# Patient Record
Sex: Female | Born: 1965 | Race: White | Hispanic: No | Marital: Single | Smoking: Current every day smoker
Health system: Southern US, Community
[De-identification: ages and names within clinical notes are randomized; demographics above are authoritative.]

## PROBLEM LIST (undated history)

## (undated) DIAGNOSIS — R7989 Other specified abnormal findings of blood chemistry: Secondary | ICD-10-CM

## (undated) DIAGNOSIS — I10 Essential (primary) hypertension: Secondary | ICD-10-CM

## (undated) DIAGNOSIS — J45909 Unspecified asthma, uncomplicated: Secondary | ICD-10-CM

## (undated) DIAGNOSIS — J449 Chronic obstructive pulmonary disease, unspecified: Secondary | ICD-10-CM

## (undated) DIAGNOSIS — E785 Hyperlipidemia, unspecified: Secondary | ICD-10-CM

## (undated) DIAGNOSIS — E876 Hypokalemia: Secondary | ICD-10-CM

## (undated) DIAGNOSIS — K219 Gastro-esophageal reflux disease without esophagitis: Secondary | ICD-10-CM

## (undated) HISTORY — DX: Essential (primary) hypertension: I10

## (undated) HISTORY — DX: Other specified abnormal findings of blood chemistry: R79.89

## (undated) HISTORY — DX: Unspecified asthma, uncomplicated: J45.909

## (undated) HISTORY — DX: Gastro-esophageal reflux disease without esophagitis: K21.9

## (undated) HISTORY — DX: Hypokalemia: E87.6

## (undated) HISTORY — DX: Hyperlipidemia, unspecified: E78.5

## (undated) HISTORY — PX: TONSILLECTOMY: SUR1361

## (undated) HISTORY — PX: INCISION AND DRAINAGE ABSCESS: SHX5864

## (undated) HISTORY — DX: Chronic obstructive pulmonary disease, unspecified: J44.9

---

## 2014-09-28 ENCOUNTER — Telehealth: Payer: Self-pay

## 2014-09-28 MED ORDER — LISINOPRIL-HYDROCHLOROTHIAZIDE 10-12.5 MG PO TABS: 1.0000 | ORAL_TABLET | Freq: Every day | ORAL | Status: DC

## 2014-09-28 NOTE — Telephone Encounter (Signed)
Lisinopril-HCTZ 10-12.5  [facility]

## 2014-09-28 NOTE — Telephone Encounter (Signed)
Rx sent to her pharmacy 

## 2014-12-18 ENCOUNTER — Other Ambulatory Visit: Payer: Self-pay | Admitting: Family Medicine

## 2014-12-18 NOTE — Telephone Encounter (Signed)
Dr. Laural Benes is her primary; routing

## 2014-12-27 ENCOUNTER — Ambulatory Visit: Payer: Self-pay | Admitting: Family Medicine

## 2015-01-03 ENCOUNTER — Ambulatory Visit: Payer: Self-pay | Admitting: Family Medicine

## 2015-01-03 ENCOUNTER — Other Ambulatory Visit: Payer: Self-pay | Admitting: Family Medicine

## 2015-01-03 DIAGNOSIS — E785 Hyperlipidemia, unspecified: Secondary | ICD-10-CM

## 2015-01-03 DIAGNOSIS — Z720 Tobacco use: Secondary | ICD-10-CM

## 2015-01-03 DIAGNOSIS — I10 Essential (primary) hypertension: Secondary | ICD-10-CM

## 2015-01-03 DIAGNOSIS — Z0000 Encounter for general adult medical examination without abnormal findings: Secondary | ICD-10-CM

## 2015-01-11 ENCOUNTER — Ambulatory Visit: Payer: Medicaid Other | Admitting: Family Medicine

## 2015-01-11 ENCOUNTER — Other Ambulatory Visit: Payer: Self-pay | Admitting: Family Medicine

## 2015-01-11 DIAGNOSIS — I10 Essential (primary) hypertension: Secondary | ICD-10-CM

## 2015-01-11 DIAGNOSIS — E785 Hyperlipidemia, unspecified: Secondary | ICD-10-CM

## 2015-01-11 DIAGNOSIS — I129 Hypertensive chronic kidney disease with stage 1 through stage 4 chronic kidney disease, or unspecified chronic kidney disease: Secondary | ICD-10-CM | POA: Insufficient documentation

## 2015-01-17 ENCOUNTER — Encounter: Payer: Self-pay | Admitting: Family Medicine

## 2015-01-17 ENCOUNTER — Ambulatory Visit (INDEPENDENT_AMBULATORY_CARE_PROVIDER_SITE_OTHER): Payer: Medicaid Other | Admitting: Family Medicine

## 2015-01-17 ENCOUNTER — Other Ambulatory Visit: Payer: Self-pay | Admitting: Family Medicine

## 2015-01-17 VITALS — BP 139/83 | HR 101 | Temp 99.2°F | Ht 59.3 in | Wt 213.0 lb

## 2015-01-17 DIAGNOSIS — Z0000 Encounter for general adult medical examination without abnormal findings: Secondary | ICD-10-CM | POA: Diagnosis not present

## 2015-01-17 DIAGNOSIS — I10 Essential (primary) hypertension: Secondary | ICD-10-CM

## 2015-01-17 DIAGNOSIS — Z1239 Encounter for other screening for malignant neoplasm of breast: Secondary | ICD-10-CM

## 2015-01-17 DIAGNOSIS — Z720 Tobacco use: Secondary | ICD-10-CM

## 2015-01-17 DIAGNOSIS — J4540 Moderate persistent asthma, uncomplicated: Secondary | ICD-10-CM

## 2015-01-17 DIAGNOSIS — L739 Follicular disorder, unspecified: Secondary | ICD-10-CM

## 2015-01-17 DIAGNOSIS — E785 Hyperlipidemia, unspecified: Secondary | ICD-10-CM

## 2015-01-17 DIAGNOSIS — J45909 Unspecified asthma, uncomplicated: Secondary | ICD-10-CM | POA: Insufficient documentation

## 2015-01-17 MED ORDER — FLUTICASONE-SALMETEROL 100-50 MCG/DOSE IN AEPB: 1.0000 | INHALATION_SPRAY | Freq: Two times a day (BID) | RESPIRATORY_TRACT | Status: DC

## 2015-01-17 MED ORDER — TRIAMCINOLONE ACETONIDE 0.1 % EX CREA: 1.0000 "application " | TOPICAL_CREAM | Freq: Two times a day (BID) | CUTANEOUS | Status: DC

## 2015-01-17 MED ORDER — SULFAMETHOXAZOLE-TRIMETHOPRIM 800-160 MG PO TABS: 1.0000 | ORAL_TABLET | Freq: Two times a day (BID) | ORAL | Status: DC

## 2015-01-17 NOTE — Assessment & Plan Note (Signed)
Encouraged patient to quit. Information given to patient today.

## 2015-01-17 NOTE — Assessment & Plan Note (Signed)
Not under great control. Has been taking 2 inhaled corticosteroids. Will stop them and start advair which she has had good results with previously. Will do spirometry next visit.

## 2015-01-17 NOTE — Assessment & Plan Note (Signed)
Checking labs when she is fasting. Continue current regimen.

## 2015-01-17 NOTE — Patient Instructions (Addendum)
Breast Self-Awareness Practicing breast self-awareness may pick up problems early, prevent significant medical complications, and possibly save your life. By practicing breast self-awareness, you can become familiar with how your breasts look and feel and if your breasts are changing. This allows you to notice changes early. It can also offer you some reassurance that your breast health is good. One way to learn what is normal for your breasts and whether your breasts are changing is to do a breast self-exam. If you find a lump or something that was not present in the past, it is best to contact your caregiver right away. Other findings that should be evaluated by your caregiver include nipple discharge, especially if it is bloody; skin changes or reddening; areas where the skin seems to be pulled in (retracted); or new lumps and bumps. Breast pain is seldom associated with cancer (malignancy), but should also be evaluated by a caregiver. HOW TO PERFORM A BREAST SELF-EXAM The best time to examine your breasts is 5-7 days after your menstrual period is over. During menstruation, the breasts are lumpier, and it may be more difficult to pick up changes. If you do not menstruate, have reached menopause, or had your uterus removed (hysterectomy), you should examine your breasts at regular intervals, such as monthly. If you are breastfeeding, examine your breasts after a feeding or after using a breast pump. Breast implants do not decrease the risk for lumps or tumors, so continue to perform breast self-exams as recommended. Talk to your caregiver about how to determine the difference between the implant and breast tissue. Also, talk about the amount of pressure you should use during the exam. Over time, you will become more familiar with the variations of your breasts and more comfortable with the exam. A breast self-exam requires you to remove all your clothes above the waist.  Look at your breasts and nipples.  Stand in front of a mirror in a room with good lighting. With your hands on your hips, push your hands firmly downward. Look for a difference in shape, contour, and size from one breast to the other (asymmetry). Asymmetry includes puckers, dips, or bumps. Also, look for skin changes, such as reddened or scaly areas on the breasts. Look for nipple changes, such as discharge, dimpling, repositioning, or redness.  Carefully feel your breasts. This is best done either in the shower or tub while using soapy water or when flat on your back. Place the arm (on the side of the breast you are examining) above your head. Use the pads (not the fingertips) of your three middle fingers on your opposite hand to feel your breasts. Start in the underarm area and use  inch (2 cm) overlapping circles to feel your breast. Use 3 different levels of pressure (light, medium, and firm pressure) at each circle before moving to the next circle. The light pressure is needed to feel the tissue closest to the skin. The medium pressure will help to feel breast tissue a little deeper, while the firm pressure is needed to feel the tissue close to the ribs. Continue the overlapping circles, moving downward over the breast until you feel your ribs below your breast. Then, move one finger-width towards the center of the body. Continue to use the  inch (2 cm) overlapping circles to feel your breast as you move slowly up toward the collar bone (clavicle) near the base of the neck. Continue the up and down exam using all 3 pressures until you reach the  middle of the chest. Do this with each breast, carefully feeling for lumps or changes.   Keep a written record with breast changes or normal findings for each breast. By writing this information down, you do not need to depend only on memory for size, tenderness, or location. Write down where you are in your menstrual cycle, if you are still menstruating. Breast tissue can have some lumps or thick  tissue. However, see your caregiver if you find anything that concerns you.  SEEK MEDICAL CARE IF:  You see a change in shape, contour, or size of your breasts or nipples.   You see skin changes, such as reddened or scaly areas on the breasts or nipples.   You have an unusual discharge from your nipples.   You feel a new lump or unusually thick areas.    This information is not intended to replace advice given to you by your health care provider. Make sure you discuss any questions you have with your health care provider.   Document Released: 03/24/2005 Document Revised: 03/10/2012 Document Reviewed: 07/09/2011 Elsevier Interactive Patient Education 2016 [place] for Adults, Female A healthy lifestyle and preventive care can promote health and wellness. Preventive health guidelines for women include the following key practices.  A routine yearly physical is a good way to check with your health care provider about your health and preventive screening. It is a chance to share any concerns and updates on your health and to receive a thorough exam.  Visit your dentist for a routine exam and preventive care every 6 months. Brush your teeth twice a day and floss once a day. Good oral hygiene prevents tooth decay and gum disease.  The frequency of eye exams is based on your age, health, family medical history, use of contact lenses, and other factors. Follow your health care provider's recommendations for frequency of eye exams.  Eat a healthy diet. Foods like vegetables, fruits, whole grains, low-fat dairy products, and lean protein foods contain the nutrients you need without too many calories. Decrease your intake of foods high in solid fats, added sugars, and salt. Eat the right amount of calories for you.Get information about a proper diet from your health care provider, if necessary.  Regular physical exercise is one of the most important things you can do for  your health. Most adults should get at least 150 minutes of moderate-intensity exercise (any activity that increases your heart rate and causes you to sweat) each week. In addition, most adults need muscle-strengthening exercises on 2 or more days a week.  Maintain a healthy weight. The body mass index (BMI) is a screening tool to identify possible weight problems. It provides an estimate of body fat based on height and weight. Your health care provider can find your BMI and can help you achieve or maintain a healthy weight.For adults 20 years and older:  A BMI below 18.5 is considered underweight.  A BMI of 18.5 to 24.9 is normal.  A BMI of 25 to 29.9 is considered overweight.  A BMI of 30 and above is considered obese.  Maintain normal blood lipids and cholesterol levels by exercising and minimizing your intake of saturated fat. Eat a balanced diet with plenty of fruit and vegetables. Blood tests for lipids and cholesterol should begin at age 71 and be repeated every 5 years. If your lipid or cholesterol levels are high, you are over 50, or you are at high risk for heart disease, you  may need your cholesterol levels checked more frequently.Ongoing high lipid and cholesterol levels should be treated with medicines if diet and exercise are not working.  If you smoke, find out from your health care provider how to quit. If you do not use tobacco, do not start.  Lung cancer screening is recommended for adults aged 70-80 years who are at high risk for developing lung cancer because of a history of smoking. A yearly low-dose CT scan of the lungs is recommended for people who have at least a 30-pack-year history of smoking and are a current smoker or have quit within the past 15 years. A pack year of smoking is smoking an average of 1 pack of cigarettes a day for 1 year (for example: 1 pack a day for 30 years or 2 packs a day for 15 years). Yearly screening should continue until the smoker has stopped  smoking for at least 15 years. Yearly screening should be stopped for people who develop a health problem that would prevent them from having lung cancer treatment.  If you are pregnant, do not drink alcohol. If you are breastfeeding, be very cautious about drinking alcohol. If you are not pregnant and choose to drink alcohol, do not have more than 1 drink per day. One drink is considered to be 12 ounces (355 mL) of beer, 5 ounces (148 mL) of wine, or 1.5 ounces (44 mL) of liquor.  Avoid use of street drugs. Do not share needles with anyone. Ask for help if you need support or instructions about stopping the use of drugs.  High blood pressure causes heart disease and increases the risk of stroke. Your blood pressure should be checked at least every 1 to 2 years. Ongoing high blood pressure should be treated with medicines if weight loss and exercise do not work.  If you are 27-48 years old, ask your health care provider if you should take aspirin to prevent strokes.  Diabetes screening is done by taking a blood sample to check your blood glucose level after you have not eaten for a certain period of time (fasting). If you are not overweight and you do not have risk factors for diabetes, you should be screened once every 3 years starting at age 42. If you are overweight or obese and you are 54-51 years of age, you should be screened for diabetes every year as part of your cardiovascular risk assessment.  Breast cancer screening is essential preventive care for women. You should practice "breast self-awareness." This means understanding the normal appearance and feel of your breasts and may include breast self-examination. Any changes detected, no matter how small, should be reported to a health care provider. Women in their 22s and 30s should have a clinical breast exam (CBE) by a health care provider as part of a regular health exam every 1 to 3 years. After age 38, women should have a CBE every year.  Starting at age 37, women should consider having a mammogram (breast X-ray test) every year. Women who have a family history of breast cancer should talk to their health care provider about genetic screening. Women at a high risk of breast cancer should talk to their health care providers about having an MRI and a mammogram every year.  Breast cancer gene (BRCA)-related cancer risk assessment is recommended for women who have family members with BRCA-related cancers. BRCA-related cancers include breast, ovarian, tubal, and peritoneal cancers. Having family members with these cancers may be associated with an  increased risk for harmful changes (mutations) in the breast cancer genes BRCA1 and BRCA2. Results of the assessment will determine the need for genetic counseling and BRCA1 and BRCA2 testing.  Your health care provider may recommend that you be screened regularly for cancer of the pelvic organs (ovaries, uterus, and vagina). This screening involves a pelvic examination, including checking for microscopic changes to the surface of your cervix (Pap test). You may be encouraged to have this screening done every 3 years, beginning at age 18.  For women ages 52-65, health care providers may recommend pelvic exams and Pap testing every 3 years, or they may recommend the Pap and pelvic exam, combined with testing for human papilloma virus (HPV), every 5 years. Some types of HPV increase your risk of cervical cancer. Testing for HPV may also be done on women of any age with unclear Pap test results.  Other health care providers may not recommend any screening for nonpregnant women who are considered low risk for pelvic cancer and who do not have symptoms. Ask your health care provider if a screening pelvic exam is right for you.  If you have had past treatment for cervical cancer or a condition that could lead to cancer, you need Pap tests and screening for cancer for at least 20 years after your treatment.  If Pap tests have been discontinued, your risk factors (such as having a new sexual partner) need to be reassessed to determine if screening should resume. Some women have medical problems that increase the chance of getting cervical cancer. In these cases, your health care provider may recommend more frequent screening and Pap tests.  Colorectal cancer can be detected and often prevented. Most routine colorectal cancer screening begins at the age of 51 years and continues through age 78 years. However, your health care provider may recommend screening at an earlier age if you have risk factors for colon cancer. On a yearly basis, your health care provider may provide home test kits to check for hidden blood in the stool. Use of a small camera at the end of a tube, to directly examine the colon (sigmoidoscopy or colonoscopy), can detect the earliest forms of colorectal cancer. Talk to your health care provider about this at age 81, when routine screening begins. Direct exam of the colon should be repeated every 5-10 years through age 85 years, unless early forms of precancerous polyps or small growths are found.  People who are at an increased risk for hepatitis B should be screened for this virus. You are considered at high risk for hepatitis B if:  You were born in a country where hepatitis B occurs often. Talk with your health care provider about which countries are considered high risk.  Your parents were born in a high-risk country and you have not received a shot to protect against hepatitis B (hepatitis B vaccine).  You have HIV or AIDS.  You use needles to inject street drugs.  You live with, or have sex with, someone who has hepatitis B.  You get hemodialysis treatment.  You take certain medicines for conditions like cancer, organ transplantation, and autoimmune conditions.  Hepatitis C blood testing is recommended for all people born from [birth year] through 1965 and any individual with known  risks for hepatitis C.  Practice safe sex. Use condoms and avoid high-risk sexual practices to reduce the spread of sexually transmitted infections (STIs). STIs include gonorrhea, chlamydia, syphilis, trichomonas, herpes, HPV, and human immunodeficiency virus (HIV). Herpes, HIV,  and HPV are viral illnesses that have no cure. They can result in disability, cancer, and death.  You should be screened for sexually transmitted illnesses (STIs) including gonorrhea and chlamydia if:  You are sexually active and are younger than 24 years.  You are older than 24 years and your health care provider tells you that you are at risk for this type of infection.  Your sexual activity has changed since you were last screened and you are at an increased risk for chlamydia or gonorrhea. Ask your health care provider if you are at risk.  If you are at risk of being infected with HIV, it is recommended that you take a prescription medicine daily to prevent HIV infection. This is called preexposure prophylaxis (PrEP). You are considered at risk if:  You are sexually active and do not regularly use condoms or know the HIV status of your partner(s).  You take drugs by injection.  You are sexually active with a partner who has HIV.  Talk with your health care provider about whether you are at high risk of being infected with HIV. If you choose to begin PrEP, you should first be tested for HIV. You should then be tested every 3 months for as long as you are taking PrEP.  Osteoporosis is a disease in which the bones lose minerals and strength with aging. This can result in serious bone fractures or breaks. The risk of osteoporosis can be identified using a bone density scan. Women ages 65 years and over and women at risk for fractures or osteoporosis should discuss screening with their health care providers. Ask your health care provider whether you should take a calcium supplement or vitamin D to reduce the rate of  osteoporosis.  Menopause can be associated with physical symptoms and risks. Hormone replacement therapy is available to decrease symptoms and risks. You should talk to your health care provider about whether hormone replacement therapy is right for you.  Use sunscreen. Apply sunscreen liberally and repeatedly throughout the day. You should seek shade when your shadow is shorter than you. Protect yourself by wearing long sleeves, pants, a wide-brimmed hat, and sunglasses year round, whenever you are outdoors.  Once a month, do a whole body skin exam, using a mirror to look at the skin on your back. Tell your health care provider of new moles, moles that have irregular borders, moles that are larger than a pencil eraser, or moles that have changed in shape or color.  Stay current with required vaccines (immunizations).  Influenza vaccine. All adults should be immunized every year.  Tetanus, diphtheria, and acellular pertussis (Td, Tdap) vaccine. Pregnant women should receive 1 dose of Tdap vaccine during each pregnancy. The dose should be obtained regardless of the length of time since the last dose. Immunization is preferred during the 27th-36th week of gestation. An adult who has not previously received Tdap or who does not know her vaccine status should receive 1 dose of Tdap. This initial dose should be followed by tetanus and diphtheria toxoids (Td) booster doses every 10 years. Adults with an unknown or incomplete history of completing a 3-dose immunization series with Td-containing vaccines should begin or complete a primary immunization series including a Tdap dose. Adults should receive a Td booster every 10 years.  Varicella vaccine. An adult without evidence of immunity to varicella should receive 2 doses or a second dose if she has previously received 1 dose. Pregnant females who do not have evidence of  immunity should receive the first dose after pregnancy. This first dose should be  obtained before leaving the health care facility. The second dose should be obtained 4-8 weeks after the first dose.  Human papillomavirus (HPV) vaccine. Females aged 13-26 years who have not received the vaccine previously should obtain the 3-dose series. The vaccine is not recommended for use in pregnant females. However, pregnancy testing is not needed before receiving a dose. If a female is found to be pregnant after receiving a dose, no treatment is needed. In that case, the remaining doses should be delayed until after the pregnancy. Immunization is recommended for any person with an immunocompromised condition through the age of 68 years if she did not get any or all doses earlier. During the 3-dose series, the second dose should be obtained 4-8 weeks after the first dose. The third dose should be obtained 24 weeks after the first dose and 16 weeks after the second dose.  Zoster vaccine. One dose is recommended for adults aged 71 years or older unless certain conditions are present.  Measles, mumps, and rubella (MMR) vaccine. Adults born before [birth year] generally are considered immune to measles and mumps. Adults born in [birth year] or later should have 1 or more doses of MMR vaccine unless there is a contraindication to the vaccine or there is laboratory evidence of immunity to each of the three diseases. A routine second dose of MMR vaccine should be obtained at least 28 days after the first dose for students attending postsecondary schools, health care workers, or international travelers. People who received inactivated measles vaccine or an unknown type of measles vaccine during 1963-1967 should receive 2 doses of MMR vaccine. People who received inactivated mumps vaccine or an unknown type of mumps vaccine before 1979 and are at high risk for mumps infection should consider immunization with 2 doses of MMR vaccine. For females of childbearing age, rubella immunity should be determined. If there is no evidence  of immunity, females who are not pregnant should be vaccinated. If there is no evidence of immunity, females who are pregnant should delay immunization until after pregnancy. Unvaccinated health care workers born before [birth year] who lack laboratory evidence of measles, mumps, or rubella immunity or laboratory confirmation of disease should consider measles and mumps immunization with 2 doses of MMR vaccine or rubella immunization with 1 dose of MMR vaccine.  Pneumococcal 13-valent conjugate (PCV13) vaccine. When indicated, a person who is uncertain of his immunization history and has no record of immunization should receive the PCV13 vaccine. All adults 19 years of age and older should receive this vaccine. An adult aged 71 years or older who has certain medical conditions and has not been previously immunized should receive 1 dose of PCV13 vaccine. This PCV13 should be followed with a dose of pneumococcal polysaccharide (PPSV23) vaccine. Adults who are at high risk for pneumococcal disease should obtain the PPSV23 vaccine at least 8 weeks after the dose of PCV13 vaccine. Adults older than 49 years of age who have normal immune system function should obtain the PPSV23 vaccine dose at least 1 year after the dose of PCV13 vaccine.  Pneumococcal polysaccharide (PPSV23) vaccine. When PCV13 is also indicated, PCV13 should be obtained first. All adults aged 58 years and older should be immunized. An adult younger than age 22 years who has certain medical conditions should be immunized. Any person who resides in a nursing home or long-term care facility should be immunized. An adult smoker should be immunized. People  with an immunocompromised condition and certain other conditions should receive both PCV13 and PPSV23 vaccines. People with human immunodeficiency virus (HIV) infection should be immunized as soon as possible after diagnosis. Immunization during chemotherapy or radiation therapy should be avoided. Routine use  of PPSV23 vaccine is not recommended for American Indians, [place] Natives, or people younger than 65 years unless there are medical conditions that require PPSV23 vaccine. When indicated, people who have unknown immunization and have no record of immunization should receive PPSV23 vaccine. One-time revaccination 5 years after the first dose of PPSV23 is recommended for people aged 19-64 years who have chronic kidney failure, nephrotic syndrome, asplenia, or immunocompromised conditions. People who received 1-2 doses of PPSV23 before age 106 years should receive another dose of PPSV23 vaccine at age 65 years or later if at least 5 years have passed since the previous dose. Doses of PPSV23 are not needed for people immunized with PPSV23 at or after age 41 years.  Meningococcal vaccine. Adults with asplenia or persistent complement component deficiencies should receive 2 doses of quadrivalent meningococcal conjugate (MenACWY-D) vaccine. The doses should be obtained at least 2 months apart. Microbiologists working with certain meningococcal bacteria, [employer] recruits, people at risk during an outbreak, and people who travel to or live in countries with a high rate of meningitis should be immunized. A first-year college student up through age 44 years who is living in a residence hall should receive a dose if she did not receive a dose on or after her 16th birthday. Adults who have certain high-risk conditions should receive one or more doses of vaccine.  Hepatitis A vaccine. Adults who wish to be protected from this disease, have certain high-risk conditions, work with hepatitis A-infected animals, work in hepatitis A research labs, or travel to or work in countries with a high rate of hepatitis A should be immunized. Adults who were previously unvaccinated and who anticipate close contact with an international adoptee during the first 60 days after arrival in the [place] from a country with a high rate of  hepatitis A should be immunized.  Hepatitis B vaccine. Adults who wish to be protected from this disease, have certain high-risk conditions, may be exposed to blood or other infectious body fluids, are household contacts or sex partners of hepatitis B positive people, are clients or workers in certain care facilities, or travel to or work in countries with a high rate of hepatitis B should be immunized.  Haemophilus influenzae type b (Hib) vaccine. A previously unvaccinated person with asplenia or sickle cell disease or having a scheduled splenectomy should receive 1 dose of Hib vaccine. 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 Hib vaccine is not recommended for adults with HIV infection. Preventive Services / Frequency Ages 61 to 64 years  Blood pressure check.** / Every 3-5 years.  Lipid and cholesterol check.** / Every 5 years beginning at age 73.  Clinical breast exam.** / Every 3 years for women in their 15s and 7s.  BRCA-related cancer risk assessment.** / For women who have family members with a BRCA-related cancer (breast, ovarian, tubal, or peritoneal cancers).  Pap test.** / Every 2 years from ages 52 through 33. Every 3 years starting at age 73 through age 38 or 27 with a history of 3 consecutive normal Pap tests.  HPV screening.** / Every 3 years from ages 30 through ages 27 to 39  with a history of 3 consecutive normal Pap tests.  Hepatitis C blood test.** / For any individual with known risks for hepatitis C.  Skin self-exam. / Monthly.  Influenza vaccine. / Every year.  Tetanus, diphtheria, and acellular pertussis (Tdap, Td) vaccine.** / Consult your health care provider. Pregnant women should receive 1 dose of Tdap vaccine during each pregnancy. 1 dose of Td every 10 years.  Varicella vaccine.** / Consult your health care provider. Pregnant females  who do not have evidence of immunity should receive the first dose after pregnancy.  HPV vaccine. / 3 doses over 6 months, if 48 and younger. The vaccine is not recommended for use in pregnant females. However, pregnancy testing is not needed before receiving a dose.  Measles, mumps, rubella (MMR) vaccine.** / You need at least 1 dose of MMR if you were born in 1957 or later. You may also need a 2nd dose. For females of childbearing age, rubella immunity should be determined. If there is no evidence of immunity, females who are not pregnant should be vaccinated. If there is no evidence of immunity, females who are pregnant should delay immunization until after pregnancy.  Pneumococcal 13-valent conjugate (PCV13) vaccine.** / Consult your health care provider.  Pneumococcal polysaccharide (PPSV23) vaccine.** / 1 to 2 doses if you smoke cigarettes or if you have certain conditions.  Meningococcal vaccine.** / 1 dose if you are age 39 to 19 years and a Market researcher living in a residence hall, or have one of several medical conditions, you need to get vaccinated against meningococcal disease. You may also need additional booster doses.  Hepatitis A vaccine.** / Consult your health care provider.  Hepatitis B vaccine.** / Consult your health care provider.  Haemophilus influenzae type b (Hib) vaccine.** / Consult your health care provider. Ages 38 to 68 years  Blood pressure check.** / Every year.  Lipid and cholesterol check.** / Every 5 years beginning at age 1 years.  Lung cancer screening. / Every year if you are aged 108-80 years and have a 30-pack-year history of smoking and currently smoke or have quit within the past 15 years. Yearly screening is stopped once you have quit smoking for at least 15 years or develop a health problem that would prevent you from having lung cancer treatment.  Clinical breast exam.** / Every year after age 76 years.  BRCA-related cancer risk  assessment.** / For women who have family members with a BRCA-related cancer (breast, ovarian, tubal, or peritoneal cancers).  Mammogram.** / Every year beginning at age 47 years and continuing for as long as you are in good health. Consult with your health care provider.  Pap test.** / Every 3 years starting at age 67 years through age 46 or 62 years with a history of 3 consecutive normal Pap tests.  HPV screening.** / Every 3 years from ages 16 years through ages 61 to 44 years with a history of 3 consecutive normal Pap tests.  Fecal occult blood test (FOBT) of stool. / Every year beginning at age 12 years and continuing until age 18 years. You may not need to do this test if you get a colonoscopy every 10 years.  Flexible sigmoidoscopy or colonoscopy.** / Every 5 years for a flexible sigmoidoscopy or every 10 years for a colonoscopy beginning at age 67 years and continuing until age 57 years.  Hepatitis C blood test.** / For all people born from [birth year] through 1965 and any individual with known risks for  hepatitis C.  Skin self-exam. / Monthly.  Influenza vaccine. / Every year.  Tetanus, diphtheria, and acellular pertussis (Tdap/Td) vaccine.** / Consult your health care provider. Pregnant women should receive 1 dose of Tdap vaccine during each pregnancy. 1 dose of Td every 10 years.  Varicella vaccine.** / Consult your health care provider. Pregnant females who do not have evidence of immunity should receive the first dose after pregnancy.  Zoster vaccine.** / 1 dose for adults aged 43 years or older.  Measles, mumps, rubella (MMR) vaccine.** / You need at least 1 dose of MMR if you were born in 1957 or later. You may also need a second dose. For females of childbearing age, rubella immunity should be determined. If there is no evidence of immunity, females who are not pregnant should be vaccinated. If there is no evidence of immunity, females who are pregnant should delay immunization until  after pregnancy.  Pneumococcal 13-valent conjugate (PCV13) vaccine.** / Consult your health care provider.  Pneumococcal polysaccharide (PPSV23) vaccine.** / 1 to 2 doses if you smoke cigarettes or if you have certain conditions.  Meningococcal vaccine.** / Consult your health care provider.  Hepatitis A vaccine.** / Consult your health care provider.  Hepatitis B vaccine.** / Consult your health care provider.  Haemophilus influenzae type b (Hib) vaccine.** / Consult your health care provider. Ages 47 years and over  Blood pressure check.** / Every year.  Lipid and cholesterol check.** / Every 5 years beginning at age 96 years.  Lung cancer screening. / Every year if you are aged 22-80 years and have a 30-pack-year history of smoking and currently smoke or have quit within the past 15 years. Yearly screening is stopped once you have quit smoking for at least 15 years or develop a health problem that would prevent you from having lung cancer treatment.  Clinical breast exam.** / Every year after age 29 years.  BRCA-related cancer risk assessment.** / For women who have family members with a BRCA-related cancer (breast, ovarian, tubal, or peritoneal cancers).  Mammogram.** / Every year beginning at age 74 years and continuing for as long as you are in good health. Consult with your health care provider.  Pap test.** / Every 3 years starting at age 50 years through age 18 or 80 years with 3 consecutive normal Pap tests. Testing can be stopped between 65 and 70 years with 3 consecutive normal Pap tests and no abnormal Pap or HPV tests in the past 10 years.  HPV screening.** / Every 3 years from ages 71 years through ages 12 or 45 years with a history of 3 consecutive normal Pap tests. Testing can be stopped between 65 and 70 years with 3 consecutive normal Pap tests and no abnormal Pap or HPV tests in the past 10 years.  Fecal occult blood test (FOBT) of stool. / Every year beginning at  age 61 years and continuing until age 37 years. You may not need to do this test if you get a colonoscopy every 10 years.  Flexible sigmoidoscopy or colonoscopy.** / Every 5 years for a flexible sigmoidoscopy or every 10 years for a colonoscopy beginning at age 76 years and continuing until age 56 years.  Hepatitis C blood test.** / For all people born from [birth year] through 1965 and any individual with known risks for hepatitis C.  Osteoporosis screening.** / A one-time screening for women ages 8 years and over and women at risk for fractures or osteoporosis.  Skin self-exam. / Monthly.  Influenza vaccine. / Every year.  Tetanus, diphtheria, and acellular pertussis (Tdap/Td) vaccine.** / 1 dose of Td every 10 years.  Varicella vaccine.** / Consult your health care provider.  Zoster vaccine.** / 1 dose for adults aged 65 years or older.  Pneumococcal 13-valent conjugate (PCV13) vaccine.** / Consult your health care provider.  Pneumococcal polysaccharide (PPSV23) vaccine.** / 1 dose for all adults aged 50 years and older.  Meningococcal vaccine.** / Consult your health care provider.  Hepatitis A vaccine.** / Consult your health care provider.  Hepatitis B vaccine.** / Consult your health care provider.  Haemophilus influenzae type b (Hib) vaccine.** / Consult your health care provider. ** Family history and personal history of risk and conditions may change your health care provider's recommendations.   This information is not intended to replace advice given to you by your health care provider. Make sure you discuss any questions you have with your health care provider.   Document Released: 05/20/2001 Document Revised: 04/14/2014 Document Reviewed: 08/19/2010 Elsevier Interactive Patient Education 2016 [employer]. Smoking Cessation, Tips for Success If you are ready to quit smoking, congratulations! You have chosen to help yourself be healthier. Cigarettes bring nicotine, tar,  carbon monoxide, and other irritants into your body. Your lungs, heart, and blood vessels will be able to work better without these poisons. There are many different ways to quit smoking. Nicotine gum, nicotine patches, a nicotine inhaler, or nicotine nasal spray can help with physical craving. Hypnosis, support groups, and medicines help break the habit of smoking. WHAT THINGS CAN I DO TO MAKE QUITTING EASIER?  Here are some tips to help you quit for good:  Pick a date when you will quit smoking completely. Tell all of your friends and family about your plan to quit on that date.  Do not try to slowly cut down on the number of cigarettes you are smoking. Pick a quit date and quit smoking completely starting on that day.  Throw away all cigarettes.   Clean and remove all ashtrays from your home, work, and car.  On a card, write down your reasons for quitting. Carry the card with you and read it when you get the urge to smoke.  Cleanse your body of nicotine. Drink enough water and fluids to keep your urine clear or pale yellow. Do this after quitting to flush the nicotine from your body.  Learn to predict your moods. Do not let a bad situation be your excuse to have a cigarette. Some situations in your life might tempt you into wanting a cigarette.  Never have "just one" cigarette. It leads to wanting another and another. Remind yourself of your decision to quit.  Change habits associated with smoking. If you smoked while driving or when feeling stressed, try other activities to replace smoking. Stand up when drinking your coffee. Brush your teeth after eating. Sit in a different chair when you read the paper. Avoid alcohol while trying to quit, and try to drink fewer caffeinated beverages. Alcohol and caffeine may urge you to smoke.  Avoid foods and drinks that can trigger a desire to smoke, such as sugary or spicy foods and alcohol.  Ask people who smoke not to smoke around you.  Have  something planned to do right after eating or having a cup of coffee. For example, plan to take a walk or exercise.  Try a relaxation exercise to calm you down and decrease your stress. Remember, you may be tense and nervous for the  first 2 weeks after you quit, but this will pass.  Find new activities to keep your hands busy. Play with a pen, coin, or rubber band. Doodle or draw things on paper.  Brush your teeth right after eating. This will help cut down on the craving for the taste of tobacco after meals. You can also try mouthwash.   Use oral substitutes in place of cigarettes. Try using lemon drops, carrots, cinnamon sticks, or chewing gum. Keep them handy so they are available when you have the urge to smoke.  When you have the urge to smoke, try deep breathing.  Designate your home as a nonsmoking area.  If you are a heavy smoker, ask your health care provider about a prescription for nicotine chewing gum. It can ease your withdrawal from nicotine.  Reward yourself. Set aside the cigarette money you save and buy yourself something nice.  Look for support from others. Join a support group or smoking cessation program. Ask someone at home or at work to help you with your plan to quit smoking.  Always ask yourself, "Do I need this cigarette or is this just a reflex?" Tell yourself, "Today, I choose not to smoke," or "I do not want to smoke." You are reminding yourself of your decision to quit.  Do not replace cigarette smoking with electronic cigarettes (commonly called e-cigarettes). The safety of e-cigarettes is unknown, and some may contain harmful chemicals.  If you relapse, do not give up! Plan ahead and think about what you will do the next time you get the urge to smoke. HOW WILL I FEEL WHEN I QUIT SMOKING? You may have symptoms of withdrawal because your body is used to nicotine (the addictive substance in cigarettes). You may crave cigarettes, be irritable, feel very hungry,  cough often, get headaches, or have difficulty concentrating. The withdrawal symptoms are only temporary. They are strongest when you first quit but will go away within 10-14 days. When withdrawal symptoms occur, stay in control. Think about your reasons for quitting. Remind yourself that these are signs that your body is healing and getting used to being without cigarettes. Remember that withdrawal symptoms are easier to treat than the major diseases that smoking can cause.  Even after the withdrawal is over, expect periodic urges to smoke. However, these cravings are generally short lived and will go away whether you smoke or not. Do not smoke! WHAT RESOURCES ARE AVAILABLE TO HELP ME QUIT SMOKING? Your health care provider can direct you to community resources or hospitals for support, which may include:  Group support.  Education.  Hypnosis.  Therapy.   This information is not intended to replace advice given to you by your health care provider. Make sure you discuss any questions you have with your health care provider.   Document Released: 12/21/2003 Document Revised: 04/14/2014 Document Reviewed: 09/09/2012 Elsevier Interactive Patient Education [employer].

## 2015-01-17 NOTE — Assessment & Plan Note (Signed)
Checking labs when she is fasting. Continue current regimen.  

## 2015-01-17 NOTE — Progress Notes (Signed)
BP 139/83 mmHg  Pulse 101  Temp(Src) 99.2 F (37.3 C)  Ht 4' 11.3" (1.506 m)  Wt 213 lb (96.616 kg)  BMI 42.60 kg/m2  SpO2 98%  LMP 01/15/2015   Subjective:    Patient ID: Nancy Stout, female    DOB: 30-Jul-1965, 49 y.o.   MRN: [id number]  HPI: Nancy Stout is a 49 y.o. female presenting on 01/17/2015 for comprehensive medical examination. Current medical complaints include:  RASH Duration:  chronic 8 months Location: in her hair all over  Itching: yes Burning: no Redness: no Oozing: yes Scaling: no Blisters: yes Painful: no Fevers: no Change in detergents/soaps/personal care products: no Recent illness: no Recent travel:no History of same: no Context: fluctuating Alleviating factors: medicated shampoo Treatments attempted:medicated shampoo Shortness of breath: no  Throat/tongue swelling: no Myalgias/arthralgias: no   ASTHMA Asthma status: uncontrolled Satisfied with current treatment?: no Albuterol/rescue inhaler frequency: multiple times daily Dyspnea frequency: daily Wheezing frequency: daily Cough frequency: daily Nocturnal symptom frequency: couple of times a month  Limitation of activity: yes Current upper respiratory symptoms: no Triggers: dust, allergies, change in weather Home peak flows: no Aerochamber/spacer use: no Visits to ER or Urgent Care in past year: no Pneumovax: Up to Date Influenza: Refused  Menopausal Symptoms: no  Depression Screen done today and results listed below:  Depression screen [facility] 2/9 01/17/2015  Decreased Interest 2  Down, Depressed, Hopeless 0  PHQ - 2 Score 2  Altered sleeping 0  Tired, decreased energy 0  Change in appetite 0  Feeling bad or failure about yourself  0  Trouble concentrating 0  Moving slowly or fidgety/restless 0  Suicidal thoughts 0  PHQ-9 Score 2  Difficult doing work/chores Not difficult at all     Past Medical History:  Past Medical History  Diagnosis Date  . Low blood potassium      Due to Stomach Virus  . Low serum sodium     Due to stomach virus  . COPD (chronic obstructive pulmonary disease) (HCC)   . GERD (gastroesophageal reflux disease)   . Hyperlipidemia   . Hypertension   . Asthma     Surgical History:  Past Surgical History  Procedure Laterality Date  . Tonsillectomy    . Cesarean section    . Incision and drainage abscess Left     leg    Medications:  Current Outpatient Prescriptions on File Prior to Visit  Medication Sig  . albuterol (PROVENTIL) (2.5 MG/3ML) 0.083% nebulizer solution USE 1 VIAL IN NEBULIZER EVERY 4 HOURS  . fexofenadine (ALLEGRA) 180 MG tablet Take 180 mg by mouth daily.  Marland Kitchen lisinopril-hydrochlorothiazide (PRINZIDE,ZESTORETIC) 10-12.5 MG per tablet Take 1 tablet by mouth daily.  . pravastatin (PRAVACHOL) 40 MG tablet Take 40 mg by mouth daily.  Marland Kitchen PROAIR HFA 108 (90 BASE) MCG/ACT inhaler INHALE 2 PUFFS BY MOUTH EVERY 4 HOURS AS NEEDED   No current facility-administered medications on file prior to visit.    Allergies:  No Known Allergies  Social History:  Social History   Social History  . Marital Status: Single    Spouse Name: N/A  . Number of Children: N/A  . Years of Education: N/A   Occupational History  . Not on file.   Social History Main Topics  . Smoking status: Current Every Day Smoker -- 0.25 packs/day    Types: Cigarettes  . Smokeless tobacco: Never Used  . Alcohol Use: 0.0 oz/week    0 Standard drinks or equivalent per week  Comment: one or less per day  . Drug Use: No  . Sexual Activity: Yes    Birth Control/ Protection: None   Other Topics Concern  . Not on file   Social History Narrative   History  Smoking status  . Current Every Day Smoker -- 0.25 packs/day  . Types: Cigarettes  Smokeless tobacco  . Never Used   History  Alcohol Use  . 0.0 oz/week  . 0 Standard drinks or equivalent per week    Comment: one or less per day    Family History:  Family History  Problem  Relation Age of Onset  . Hyperlipidemia Mother   . Hypertension Mother   . Cancer Father     Lung  . Hypertension Father   . Hyperlipidemia Sister   . Drug abuse Son     Past medical history, surgical history, medications, allergies, family history and social history reviewed with patient today and changes made to appropriate areas of the chart.   Review of Systems  Constitutional: Positive for malaise/fatigue. Negative for fever, chills, weight loss and diaphoresis.  HENT: Positive for ear discharge. Negative for congestion, ear pain, hearing loss, nosebleeds, sore throat and tinnitus.   Eyes: Negative.   Respiratory: Positive for cough and wheezing. Negative for hemoptysis, sputum production, shortness of breath and stridor.   Cardiovascular: Negative.   Gastrointestinal: Positive for heartburn. Negative for nausea, vomiting, abdominal pain, diarrhea, constipation, blood in stool and melena.  Genitourinary: Negative.   Musculoskeletal: Negative.   Skin: Positive for itching and rash.  Neurological: Positive for tingling and headaches. Negative for dizziness, tremors, sensory change, speech change, focal weakness, seizures, loss of consciousness and weakness.  Endo/Heme/Allergies: Positive for environmental allergies. Negative for polydipsia. Bruises/bleeds easily.  Psychiatric/Behavioral: Negative.     All other ROS negative except what is listed above and in the HPI.      Objective:    BP 139/83 mmHg  Pulse 101  Temp(Src) 99.2 F (37.3 C)  Ht 4' 11.3" (1.506 m)  Wt 213 lb (96.616 kg)  BMI 42.60 kg/m2  SpO2 98%  LMP 01/15/2015  Wt Readings from Last 3 Encounters:  01/17/15 213 lb (96.616 kg)  12/27/14 204 lb (92.534 kg)    Physical Exam  Constitutional: She is oriented to person, place, and time. She appears well-developed and well-nourished. No distress.  HENT:  Head: Normocephalic and atraumatic.  Right Ear: Hearing and external ear normal.  Left Ear: Hearing and  external ear normal.  Nose: Nose normal.  Mouth/Throat: Oropharynx is clear and moist. No oropharyngeal exudate.  Eyes: Conjunctivae, EOM and lids are normal. Pupils are equal, round, and reactive to light. Right eye exhibits no discharge. Left eye exhibits no discharge. No scleral icterus.  Neck: Normal range of motion. Neck supple. No JVD present. No tracheal deviation present. No thyromegaly present.  Cardiovascular: Normal rate, regular rhythm, normal heart sounds and intact distal pulses.  Exam reveals no gallop and no friction rub.   No murmur heard. Pulmonary/Chest: Effort normal. No stridor. No respiratory distress. She has wheezes. She has no rales. She exhibits no tenderness. Right breast exhibits no inverted nipple, no mass, no nipple discharge, no skin change and no tenderness. Left breast exhibits no inverted nipple, no mass, no nipple discharge, no skin change and no tenderness. Breasts are symmetrical.  End expiratory on L lung  Abdominal: Soft. Bowel sounds are normal. She exhibits no distension and no mass. There is no tenderness. There is no rebound  and no guarding.  Genitourinary:  Deferred at patient's request  Musculoskeletal: Normal range of motion. She exhibits no edema or tenderness.  Lymphadenopathy:    She has no cervical adenopathy.  Neurological: She is alert and oriented to person, place, and time. She has normal reflexes. She displays normal reflexes. No cranial nerve deficit. She exhibits normal muscle tone. Coordination normal.  Skin: Skin is warm, dry and intact. Rash noted. She is not diaphoretic. No erythema. No pallor.  Pustular pin prick spots in hair, excoriated  Psychiatric: She has a normal mood and affect. Her speech is normal and behavior is normal. Judgment and thought content normal. Cognition and memory are normal.  Nursing note and vitals reviewed.   No results found for this or any previous visit.    Assessment & Plan:   Problem List Items  Addressed This Visit      Cardiovascular and Mediastinum   HTN (hypertension)    Checking labs when she is fasting. Continue current regimen.         Respiratory   Asthma    Not under great control. Has been taking 2 inhaled corticosteroids. Will stop them and start advair which she has had good results with previously. Will do spirometry next visit.       Relevant Medications   Fluticasone-Salmeterol (ADVAIR) 100-50 MCG/DOSE AEPB     Other   Hyperlipidemia    Checking labs when she is fasting. Continue current regimen.       Routine general medical examination at a health care facility - Primary    Up to date on shots except flu which she refuses. Mammo ordered today. Pap up to date- will obtain records to see if she's due 2017 or 2019. To do labs when she is fasting shortly. Work on diet and exercise.       Tobacco abuse    Encouraged patient to quit. Information given to patient today.        Other Visit Diagnoses    Screening for breast cancer        Mammo ordered today.     Relevant Orders    MM DIGITAL SCREENING BILATERAL    Folliculitis        Will treat with bactrim for superficial infection and triamcinalone for when the itching starts. Call if not getting better or getting worse.         Follow up plan: Return ASAP for lab only visit, 6 months, for HTN/HLD visit.   LABORATORY TESTING:  - Pap smear: up to date  IMMUNIZATIONS:   - Tdap: Tetanus vaccination status reviewed: last tetanus booster within 10 years. - Influenza: Refused - Pneumovax: Up to date  SCREENING: -Mammogram: Ordered today   PATIENT COUNSELING:   Advised to take 1 mg of folate supplement per day if capable of pregnancy.   Sexuality: Discussed sexually transmitted diseases, partner selection, use of condoms, avoidance of unintended pregnancy  and contraceptive alternatives.   Advised to avoid cigarette smoking.  I discussed with the patient that most people either abstain from  alcohol or drink within safe limits (<=14/week and <=4 drinks/occasion for males, <=7/weeks and <= 3 drinks/occasion for females) and that the risk for alcohol disorders and other health effects rises proportionally with the number of drinks per week and how often a drinker exceeds daily limits.  Discussed cessation/primary prevention of drug use and availability of treatment for abuse.   Diet: Encouraged to adjust caloric intake to maintain  or achieve  ideal body weight, to reduce intake of dietary saturated fat and total fat, to limit sodium intake by avoiding high sodium foods and not adding table salt, and to maintain adequate dietary potassium and calcium preferably from fresh fruits, vegetables, and low-fat dairy products.    stressed the importance of regular exercise  Injury prevention: Discussed safety belts, safety helmets, smoke detector, smoking near bedding or upholstery.   Dental health: Discussed importance of regular tooth brushing, flossing, and dental visits.    NEXT PREVENTATIVE PHYSICAL DUE IN 1 YEAR. Return ASAP for lab only visit, 6 months, for HTN/HLD visit.

## 2015-01-17 NOTE — Assessment & Plan Note (Signed)
Up to date on shots except flu which she refuses. Mammo ordered today. Pap up to date- will obtain records to see if she's due 2017 or 2019. To do labs when she is fasting shortly. Work on diet and exercise.

## 2015-01-19 ENCOUNTER — Other Ambulatory Visit: Payer: Medicaid Other

## 2015-01-19 DIAGNOSIS — E785 Hyperlipidemia, unspecified: Secondary | ICD-10-CM

## 2015-01-19 DIAGNOSIS — I10 Essential (primary) hypertension: Secondary | ICD-10-CM

## 2015-01-19 DIAGNOSIS — Z0000 Encounter for general adult medical examination without abnormal findings: Secondary | ICD-10-CM

## 2015-01-19 DIAGNOSIS — Z720 Tobacco use: Secondary | ICD-10-CM

## 2015-01-19 LAB — LIPID PANEL PICCOLO, WAIVED
CHOLESTEROL PICCOLO, WAIVED: 221 mg/dL — AB (ref <200)
Chol/HDL Ratio Piccolo,Waive: 2.9 mg/dL
HDL CHOL PICCOLO, WAIVED: 77 mg/dL (ref >59)
LDL Chol Calc Piccolo Waived: 126 mg/dL — ABNORMAL HIGH (ref <100)
Triglycerides Piccolo,Waived: 91 mg/dL (ref <150)
VLDL CHOL CALC PICCOLO,WAIVE: 18 mg/dL (ref <30)

## 2015-01-19 LAB — UA/M W/RFLX CULTURE, ROUTINE
Bilirubin, UA: NEGATIVE
Glucose, UA: NEGATIVE
Leukocytes, UA: NEGATIVE
NITRITE UA: NEGATIVE
PH UA: 5.5 (ref 5.0–7.5)
Specific Gravity, UA: >1.03 — ABNORMAL HIGH (ref 1.005–1.030)
UUROB: 0.2 mg/dL (ref 0.2–1.0)

## 2015-01-19 LAB — MICROSCOPIC EXAMINATION

## 2015-01-19 LAB — MICROALBUMIN, URINE WAIVED
Creatinine, Urine Waived: 300 mg/dL (ref 10–300)
Microalb, Ur Waived: 80 mg/L — ABNORMAL HIGH (ref 0–19)

## 2015-01-20 LAB — COMPREHENSIVE METABOLIC PANEL
A/G RATIO: 1.7 (ref 1.1–2.5)
ALT: 21 IU/L (ref 0–32)
AST: 13 IU/L (ref 0–40)
Albumin: 4.5 g/dL (ref 3.5–5.5)
Alkaline Phosphatase: 99 IU/L (ref 39–117)
BUN/Creatinine Ratio: 20 (ref 9–23)
BUN: 17 mg/dL (ref 6–24)
Bilirubin Total: 0.5 mg/dL (ref 0.0–1.2)
CALCIUM: 9.8 mg/dL (ref 8.7–10.2)
CO2: 26 mmol/L (ref 18–29)
Chloride: 95 mmol/L — ABNORMAL LOW (ref 97–108)
Creatinine, Ser: 0.84 mg/dL (ref 0.57–1.00)
GFR calc Af Amer: 94 mL/min/{1.73_m2} (ref >59)
GFR, EST NON AFRICAN AMERICAN: 82 mL/min/{1.73_m2} (ref >59)
GLUCOSE: 106 mg/dL — AB (ref 65–99)
Globulin, Total: 2.6 g/dL (ref 1.5–4.5)
POTASSIUM: 5.2 mmol/L (ref 3.5–5.2)
Sodium: 136 mmol/L (ref 134–144)
Total Protein: 7.1 g/dL (ref 6.0–8.5)

## 2015-01-20 LAB — CBC WITH DIFFERENTIAL/PLATELET
BASOS ABS: 0 10*3/uL (ref 0.0–0.2)
Basos: 1 %
EOS (ABSOLUTE): 0.3 10*3/uL (ref 0.0–0.4)
Eos: 5 %
Hematocrit: 45.9 % (ref 34.0–46.6)
Hemoglobin: 16.2 g/dL — ABNORMAL HIGH (ref 11.1–15.9)
IMMATURE GRANS (ABS): 0 10*3/uL (ref 0.0–0.1)
IMMATURE GRANULOCYTES: 0 %
LYMPHS: 20 %
Lymphocytes Absolute: 1.3 10*3/uL (ref 0.7–3.1)
MCH: 33.3 pg — ABNORMAL HIGH (ref 26.6–33.0)
MCHC: 35.3 g/dL (ref 31.5–35.7)
MCV: 94 fL (ref 79–97)
MONOS ABS: 0.6 10*3/uL (ref 0.1–0.9)
Monocytes: 10 %
NEUTROS PCT: 64 %
Neutrophils Absolute: 4 10*3/uL (ref 1.4–7.0)
PLATELETS: 293 10*3/uL (ref 150–379)
RBC: 4.86 x10E6/uL (ref 3.77–5.28)
RDW: 13.6 % (ref 12.3–15.4)
WBC: 6.2 10*3/uL (ref 3.4–10.8)

## 2015-01-20 LAB — TSH: TSH: 0.993 u[IU]/mL (ref 0.450–4.500)

## 2015-01-22 ENCOUNTER — Encounter: Payer: Self-pay | Admitting: Family Medicine

## 2015-01-22 DIAGNOSIS — D751 Secondary polycythemia: Secondary | ICD-10-CM

## 2015-01-24 ENCOUNTER — Encounter: Payer: Self-pay | Admitting: Unknown Physician Specialty

## 2015-01-24 ENCOUNTER — Ambulatory Visit (INDEPENDENT_AMBULATORY_CARE_PROVIDER_SITE_OTHER): Payer: Medicaid Other | Admitting: Unknown Physician Specialty

## 2015-01-24 VITALS — BP 109/72 | HR 99 | Temp 98.1°F | Ht 59.7 in | Wt 209.0 lb

## 2015-01-24 DIAGNOSIS — J4540 Moderate persistent asthma, uncomplicated: Secondary | ICD-10-CM | POA: Diagnosis not present

## 2015-01-24 DIAGNOSIS — L500 Allergic urticaria: Secondary | ICD-10-CM | POA: Diagnosis not present

## 2015-01-24 DIAGNOSIS — T50905A Adverse effect of unspecified drugs, medicaments and biological substances, initial encounter: Principal | ICD-10-CM

## 2015-01-24 MED ORDER — PREDNISONE 10 MG PO TABS: ORAL_TABLET | ORAL | Status: DC

## 2015-01-24 MED ORDER — FLUTICASONE-SALMETEROL 250-50 MCG/DOSE IN AEPB: 1.0000 | INHALATION_SPRAY | Freq: Two times a day (BID) | RESPIRATORY_TRACT | Status: DC

## 2015-01-24 NOTE — Assessment & Plan Note (Addendum)
Uncontrolled increased Advair dose to 250-50 mcg/dose instructed to take 1 puff twice a day.  ? Poor control due to allergy to Sulfa.  She is due to see Dr. Laural BenesJohnson next month

## 2015-01-24 NOTE — Progress Notes (Signed)
BP 109/72 mmHg  Pulse 99  Temp(Src) 98.1 F (36.7 C)  Ht 4' 11.7" (1.516 m)  Wt 209 lb (94.802 kg)  BMI 41.25 kg/m2  SpO2 100%  LMP 01/15/2015 (Exact Date)   Subjective:    Patient ID: Nancy Stout, female    DOB: 1965-07-16, 49 y.o.   MRN: [id number]  HPI: Nancy Stout is a 49 y.o. female  Chief Complaint  Patient presents with  . Rash    pt states she has a rash that has broken out from her neck to her knees. States she has swelling, itching and burning with the rash. States Dr. Laural Benes gave her septra that she started taking last Thursday and the rash appeared yesterday so she did not take the antibiotic today because she states she does not know if this rash is coming from the medication.   Rash Pt presents to the office with c/o itchy, burning rash with bilateral hand swelling that started in her hair than traveled down to her knees onset last night.  She has been taking Bactrim 7 days ago for Folliculitis. She has started taking Advair she has taken this in the past without complications.  She has a hx of hives.  She has taken po Benadryl and Triamcinalone cream without relief of symptoms.  Pertinent negative denies changes in laundrey detergent or soap, insect bites, recent travel, sick contacts, shortness of breath, chest pain, swelling of throat, dysphagia, fever, chills, nausea, vomiting, or diarrhea   Asthma Pt presents with c/o increased wheezing she states the Advair has not been working she has had increased wheezing.  7 days ago her Qvar and Flovent were discontinued.  She has been using her Albuterol Nebulizer Solution more frequently up to 3 times a day.  Pertinent negatives denies chest pain, shortness of breath, or  palpitations  Relevant past medical, surgical, family and social history reviewed and updated as indicated. Interim medical history since our last visit reviewed. Allergies and medications reviewed and updated.  Review of Systems   Constitutional: Negative.   HENT: Positive for sore throat. Negative for ear discharge, ear pain, mouth sores, postnasal drip, rhinorrhea, sinus pressure and sneezing.   Respiratory: Positive for cough and wheezing. Negative for choking, chest tightness, shortness of breath and stridor.   Cardiovascular: Negative.   Gastrointestinal: Negative.   Genitourinary: Negative.   Musculoskeletal: Positive for arthralgias.  Allergic/Immunologic: Positive for environmental allergies. Negative for food allergies.       Generalized rash  Neurological: Negative.   Hematological: Negative.   Psychiatric/Behavioral: Negative.     Per HPI unless specifically indicated above     Objective:    BP 109/72 mmHg  Pulse 99  Temp(Src) 98.1 F (36.7 C)  Ht 4' 11.7" (1.516 m)  Wt 209 lb (94.802 kg)  BMI 41.25 kg/m2  SpO2 100%  LMP 01/15/2015 (Exact Date)  Wt Readings from Last 3 Encounters:  01/24/15 209 lb (94.802 kg)  01/17/15 213 lb (96.616 kg)  12/27/14 204 lb (92.534 kg)    Physical Exam  Constitutional: She is oriented to person, place, and time. She appears well-developed and well-nourished. No distress.  HENT:  Head: Normocephalic and atraumatic.  Right Ear: External ear normal.  Left Ear: External ear normal.  Nose: Nose normal.  Neck: Normal range of motion. Neck supple.  Cardiovascular: Normal rate, regular rhythm, normal heart sounds and intact distal pulses.   Pulmonary/Chest: Effort normal. No respiratory distress. She has wheezes. She has no rales. She exhibits  no tenderness.  Musculoskeletal: Normal range of motion.  2+ nonpitting bilateral hand edema  Neurological: She is alert and oriented to person, place, and time.  Skin: Rash noted. She is not diaphoretic. There is erythema. No pallor.  Generalized flat erythematous rash present from head to bilateral shins; skin is warm to touch; scattered excoriated areas on bilateral wrist and forearms   Psychiatric: She has a normal  mood and affect. Her behavior is normal. Judgment and thought content normal.        Assessment & Plan:   Problem List Items Addressed This Visit      Unprioritized   Asthma    Uncontrolled increased Advair dose to 250-50 mcg/dose instructed to take 1 puff twice a day.  ? Poor control due to allergy to Sulfa.  She is due to see Dr. Laural BenesJohnson next month      Relevant Medications   predniSONE (DELTASONE) 10 MG tablet   Fluticasone-Salmeterol (ADVAIR DISKUS) 250-50 MCG/DOSE AEPB    Other Visit Diagnoses    Urticaria due to drug allergy    -  Primary    Prescribed Prednisone taper instructed to take as directed Continue taking Allegra daily Notify office if symptoms worsen or persist        Follow up plan: Return if symptoms worsen or fail to improve.

## 2015-02-07 ENCOUNTER — Other Ambulatory Visit: Payer: Self-pay | Admitting: Family Medicine

## 2015-02-09 NOTE — Telephone Encounter (Signed)
Pt would like to know exactly why her refills were denied.

## 2015-03-18 ENCOUNTER — Other Ambulatory Visit: Payer: Self-pay | Admitting: Unknown Physician Specialty

## 2015-03-19 NOTE — Telephone Encounter (Signed)
Your patient.  Thanks 

## 2015-05-13 ENCOUNTER — Other Ambulatory Visit: Payer: Self-pay | Admitting: Family Medicine

## 2015-06-04 ENCOUNTER — Other Ambulatory Visit: Payer: Self-pay | Admitting: Family Medicine

## 2015-06-04 NOTE — Telephone Encounter (Signed)
Called Allegra prescription in, they have the proair prescription.

## 2015-06-04 NOTE — Telephone Encounter (Signed)
Refilled with 6 refills at the beginning of the month. If she has gone through all of those refills, she needs an appointment.

## 2015-07-04 ENCOUNTER — Encounter: Payer: Self-pay | Admitting: Family Medicine

## 2015-07-04 ENCOUNTER — Ambulatory Visit (INDEPENDENT_AMBULATORY_CARE_PROVIDER_SITE_OTHER): Payer: Medicaid Other | Admitting: Family Medicine

## 2015-07-04 VITALS — BP 130/84 | HR 82 | Temp 98.0°F | Ht 60.2 in | Wt 210.0 lb

## 2015-07-04 DIAGNOSIS — J302 Other seasonal allergic rhinitis: Secondary | ICD-10-CM | POA: Diagnosis not present

## 2015-07-04 DIAGNOSIS — J4540 Moderate persistent asthma, uncomplicated: Secondary | ICD-10-CM | POA: Diagnosis not present

## 2015-07-04 DIAGNOSIS — L309 Dermatitis, unspecified: Secondary | ICD-10-CM | POA: Diagnosis not present

## 2015-07-04 DIAGNOSIS — J309 Allergic rhinitis, unspecified: Secondary | ICD-10-CM | POA: Insufficient documentation

## 2015-07-04 MED ORDER — TRIAMCINOLONE ACETONIDE 0.1 % EX CREA: 1.0000 "application " | TOPICAL_CREAM | Freq: Two times a day (BID) | CUTANEOUS | Status: DC

## 2015-07-04 MED ORDER — FLUTICASONE PROPIONATE 50 MCG/ACT NA SUSP: 2.0000 | Freq: Every day | NASAL | Status: DC

## 2015-07-04 MED ORDER — FLUTICASONE-SALMETEROL 250-50 MCG/DOSE IN AEPB: INHALATION_SPRAY | RESPIRATORY_TRACT | Status: DC

## 2015-07-04 NOTE — Assessment & Plan Note (Signed)
Will start flonase. Continue allegra, if not getting better consider singulair. Call with any problems.

## 2015-07-04 NOTE — Assessment & Plan Note (Signed)
Lungs clear today. Continue current regimen. Continue to monitor. Recheck at follow up. Continue medication.

## 2015-07-04 NOTE — Progress Notes (Signed)
BP 130/84 mmHg  Pulse 82  Temp(Src) 98 F (36.7 C)  Ht 5' 0.2" (1.529 m)  Wt 210 lb (95.255 kg)  BMI 40.74 kg/m2  SpO2 98%  LMP 07/01/2015 (Exact Date)   Subjective:    Patient ID: Nancy Stout, female    DOB: 01-Jun-1965, 50 y.o.   MRN: [id number]  HPI: Nancy Stout is a 50 y.o. female  Chief Complaint  Patient presents with  . Asthma    Patient needs a refill on the Advair 250  . Rash    Patient has a rash on the tip of her nose and between her fingers, she broke out with hives over the weekend  . Allergies    Patient states that her eyes are really gooky, is feels like there is sand and baby oil in them.   RASH Duration:  Saturday  Location: in between her fingers and on her face  Itching: yes Burning: yes Redness: yes Oozing: no Scaling: no Blisters: no Painful: no Fevers: no Change in detergents/soaps/personal care products: no Recent illness: no Recent travel:no History of same: no Context: better Alleviating factors: hydrocortisone cream, benadryl and lotion/moisturizer Treatments attempted:hydrocortisone cream, benadryl and lotion/moisturizer Shortness of breath: no  Throat/tongue swelling: no Myalgias/arthralgias: no  ALLERGIES Duration: about a week Runny nose: yes  Nasal congestion: yes Nasal itching: yes Sneezing: yes Eye swelling, itching or discharge: yes Post nasal drip: no Cough: no Sinus pressure: no  Ear pain: no  Ear pressure: no  Fever: no  Satisfied with current treatment: no Allergist evaluation in past: no Allergen injection immunotherapy: no Recurrent sinus infections: no ENT evaluation in past: no Known environmental allergy: yes  Breathing doing better. No concerns about that,  Relevant past medical, surgical, family and social history reviewed and updated as indicated. Interim medical history since our last visit reviewed. Allergies and medications reviewed and updated.  Review of Systems  Constitutional:  Negative.   HENT: Positive for congestion, postnasal drip, rhinorrhea, sinus pressure and sneezing. Negative for dental problem, drooling, ear discharge, ear pain, facial swelling, hearing loss, mouth sores, nosebleeds, sore throat, tinnitus, trouble swallowing and voice change.   Respiratory: Negative.   Cardiovascular: Negative.   Psychiatric/Behavioral: Negative.     Per HPI unless specifically indicated above     Objective:    BP 130/84 mmHg  Pulse 82  Temp(Src) 98 F (36.7 C)  Ht 5' 0.2" (1.529 m)  Wt 210 lb (95.255 kg)  BMI 40.74 kg/m2  SpO2 98%  LMP 07/01/2015 (Exact Date)  Wt Readings from Last 3 Encounters:  07/04/15 210 lb (95.255 kg)  01/24/15 209 lb (94.802 kg)  01/17/15 213 lb (96.616 kg)    Physical Exam  Constitutional: She is oriented to person, place, and time. She appears well-developed and well-nourished. No distress.  HENT:  Head: Normocephalic and atraumatic.  Right Ear: Hearing and external ear normal.  Left Ear: Hearing and external ear normal.  Nose: Nose normal.  Mouth/Throat: Oropharynx is clear and moist. No oropharyngeal exudate.  Eyes: Conjunctivae, EOM and lids are normal. Pupils are equal, round, and reactive to light. Right eye exhibits no discharge. Left eye exhibits no discharge. No scleral icterus.  Neck: Normal range of motion. Neck supple. No JVD present. No tracheal deviation present. No thyromegaly present.  Cardiovascular: Normal rate, regular rhythm, normal heart sounds and intact distal pulses.  Exam reveals no gallop and no friction rub.   No murmur heard. Pulmonary/Chest: Effort normal and breath sounds normal. No stridor.  No respiratory distress. She has no wheezes. She has no rales. She exhibits no tenderness.  Musculoskeletal: Normal range of motion.  Lymphadenopathy:    She has cervical adenopathy.  Neurological: She is alert and oriented to person, place, and time.  Skin: Skin is warm, dry and intact. Rash (dishydrotic eczema  on inside of her fingers) noted. She is not diaphoretic. No erythema. No pallor.  Psychiatric: She has a normal mood and affect. Her speech is normal and behavior is normal. Judgment and thought content normal. Cognition and memory are normal.  Nursing note and vitals reviewed.   Results for orders placed or performed in visit on 01/19/15  Microscopic Examination  Result Value Ref Range   WBC, UA 0-5 0 -  5 /hpf   RBC, UA 0-2 0 -  2 /hpf   Epithelial Cells (non renal) 0-10 0 - 10 /hpf   Crystals Present (A) N/A   Crystal Type Amorphous Sediment N/A   Mucus, UA Present Not Estab.   Bacteria, UA Few None seen/Few  CBC with Differential/Platelet  Result Value Ref Range   WBC 6.2 3.4 - 10.8 x10E3/uL   RBC 4.86 3.77 - 5.28 x10E6/uL   Hemoglobin 16.2 (H) 11.1 - 15.9 g/dL   Hematocrit 16.1 09.6 - 46.6 %   MCV 94 79 - 97 fL   MCH 33.3 (H) 26.6 - 33.0 pg   MCHC 35.3 31.5 - 35.7 g/dL   RDW 04.5 40.9 - 81.1 %   Platelets 293 150 - 379 x10E3/uL   Neutrophils 64 %   Lymphs 20 %   Monocytes 10 %   Eos 5 %   Basos 1 %   Neutrophils Absolute 4.0 1.4 - 7.0 x10E3/uL   Lymphocytes Absolute 1.3 0.7 - 3.1 x10E3/uL   Monocytes Absolute 0.6 0.1 - 0.9 x10E3/uL   EOS (ABSOLUTE) 0.3 0.0 - 0.4 x10E3/uL   Basophils Absolute 0.0 0.0 - 0.2 x10E3/uL   Immature Granulocytes 0 %   Immature Grans (Abs) 0.0 0.0 - 0.1 x10E3/uL  Comprehensive metabolic panel  Result Value Ref Range   Glucose 106 (H) 65 - 99 mg/dL   BUN 17 6 - 24 mg/dL   Creatinine, Ser 9.14 0.57 - 1.00 mg/dL   GFR calc non Af Amer 82 >59 mL/min/1.73   GFR calc Af Amer 94 >59 mL/min/1.73   BUN/Creatinine Ratio 20 9 - 23   Sodium 136 134 - 144 mmol/L   Potassium 5.2 3.5 - 5.2 mmol/L   Chloride 95 (L) 97 - 108 mmol/L   CO2 26 18 - 29 mmol/L   Calcium 9.8 8.7 - 10.2 mg/dL   Total Protein 7.1 6.0 - 8.5 g/dL   Albumin 4.5 3.5 - 5.5 g/dL   Globulin, Total 2.6 1.5 - 4.5 g/dL   Albumin/Globulin Ratio 1.7 1.1 - 2.5   Bilirubin Total 0.5 0.0  - 1.2 mg/dL   Alkaline Phosphatase 99 39 - 117 IU/L   AST 13 0 - 40 IU/L   ALT 21 0 - 32 IU/L  Lipid Panel Piccolo, Waived  Result Value Ref Range   Cholesterol Piccolo, Waived 221 (H) <200 mg/dL   HDL Chol Piccolo, Waived 77 >59 mg/dL   Triglycerides Piccolo,Waived 91 <150 mg/dL   Chol/HDL Ratio Piccolo,Waive 2.9 mg/dL   LDL Chol Calc Piccolo Waived 126 (H) <100 mg/dL   VLDL Chol Calc Piccolo,Waive 18 <30 mg/dL  Microalbumin, Urine Waived  Result Value Ref Range   Microalb, Ur Waived 80 (H) 0 -  19 mg/L   Creatinine, Urine Waived 300 10 - 300 mg/dL   Microalb/Creat Ratio 30-300 (H) <30 mg/g  TSH  Result Value Ref Range   TSH 0.993 0.450 - 4.500 uIU/mL  UA/M w/rflx Culture, Routine  Result Value Ref Range   Specific Gravity, UA >1.030 (H) 1.005 - 1.030   pH, UA 5.5 5.0 - 7.5   Color, UA Yellow Yellow   Appearance Ur Clear Clear   Leukocytes, UA Negative Negative   Protein, UA Trace Negative/Trace   Glucose, UA Negative Negative   Ketones, UA Trace (A) Negative   RBC, UA Trace (A) Negative   Bilirubin, UA Negative Negative   Urobilinogen, Ur 0.2 0.2 - 1.0 mg/dL   Nitrite, UA Negative Negative   Microscopic Examination See below:       Assessment & Plan:   Problem List Items Addressed This Visit      Respiratory   Asthma    Lungs clear today. Continue current regimen. Continue to monitor. Recheck at follow up. Continue medication.       Relevant Medications   Fluticasone-Salmeterol (ADVAIR DISKUS) 250-50 MCG/DOSE AEPB   Allergic rhinitis    Will start flonase. Continue allegra, if not getting better consider singulair. Call with any problems.         Musculoskeletal and Integument   Eczema of both hands - Primary    Will treat with triamcinalone. Call if not getting better. Gentle skin cleansing.           Follow up plan: Return As scheduled.

## 2015-07-04 NOTE — Assessment & Plan Note (Addendum)
Will treat with triamcinalone. Call if not getting better. Gentle skin cleansing.

## 2015-07-09 ENCOUNTER — Other Ambulatory Visit: Payer: Self-pay | Admitting: Family Medicine

## 2015-07-18 ENCOUNTER — Ambulatory Visit: Payer: Medicaid Other | Admitting: Family Medicine

## 2015-07-19 ENCOUNTER — Other Ambulatory Visit: Payer: Self-pay | Admitting: Family Medicine

## 2015-11-10 ENCOUNTER — Other Ambulatory Visit: Payer: Self-pay | Admitting: Family Medicine

## 2015-11-12 NOTE — Telephone Encounter (Signed)
Patient overdue for appointment. Please get her scheduled for appointment and then I'll get her enough to make it to that appointment.

## 2015-11-12 NOTE — Telephone Encounter (Signed)
Patient scheduled for 11/1715 °

## 2015-11-22 ENCOUNTER — Ambulatory Visit: Payer: Self-pay | Admitting: Family Medicine

## 2015-12-09 ENCOUNTER — Other Ambulatory Visit: Payer: Self-pay | Admitting: Family Medicine

## 2016-02-19 ENCOUNTER — Other Ambulatory Visit: Payer: Self-pay | Admitting: Family Medicine

## 2016-05-11 ENCOUNTER — Other Ambulatory Visit: Payer: Self-pay | Admitting: Family Medicine

## 2016-06-08 DIAGNOSIS — H00011 Hordeolum externum right upper eyelid: Secondary | ICD-10-CM | POA: Diagnosis not present

## 2016-06-08 DIAGNOSIS — H6691 Otitis media, unspecified, right ear: Secondary | ICD-10-CM | POA: Diagnosis not present

## 2016-06-08 DIAGNOSIS — R062 Wheezing: Secondary | ICD-10-CM | POA: Diagnosis not present

## 2016-06-09 ENCOUNTER — Telehealth: Payer: Self-pay | Admitting: *Deleted

## 2016-06-09 NOTE — Telephone Encounter (Signed)
Spoke with patient in follow up to her call on 06/08/16 to [facility]. Patient went to an urgent care that day and was given Prednisone, Amoxicillin and some other meds for her symptoms. Was told to follow up with us in 3 days. Scheduled appointment with Dr. Laural BenesJohnson for Thursday 06/12/16 at 1:30

## 2016-06-12 ENCOUNTER — Other Ambulatory Visit: Payer: Self-pay | Admitting: Family Medicine

## 2016-06-12 ENCOUNTER — Encounter: Payer: Self-pay | Admitting: Family Medicine

## 2016-06-12 ENCOUNTER — Ambulatory Visit (INDEPENDENT_AMBULATORY_CARE_PROVIDER_SITE_OTHER): Payer: Medicaid Other | Admitting: Family Medicine

## 2016-06-12 VITALS — BP 118/77 | HR 91 | Temp 98.1°F | Resp 17 | Ht 60.2 in | Wt 203.0 lb

## 2016-06-12 DIAGNOSIS — H6501 Acute serous otitis media, right ear: Secondary | ICD-10-CM | POA: Diagnosis not present

## 2016-06-12 DIAGNOSIS — D751 Secondary polycythemia: Secondary | ICD-10-CM | POA: Diagnosis not present

## 2016-06-12 DIAGNOSIS — J181 Lobar pneumonia, unspecified organism: Secondary | ICD-10-CM

## 2016-06-12 DIAGNOSIS — I129 Hypertensive chronic kidney disease with stage 1 through stage 4 chronic kidney disease, or unspecified chronic kidney disease: Secondary | ICD-10-CM

## 2016-06-12 DIAGNOSIS — E782 Mixed hyperlipidemia: Secondary | ICD-10-CM

## 2016-06-12 DIAGNOSIS — Z720 Tobacco use: Secondary | ICD-10-CM | POA: Diagnosis not present

## 2016-06-12 DIAGNOSIS — J4540 Moderate persistent asthma, uncomplicated: Secondary | ICD-10-CM | POA: Diagnosis not present

## 2016-06-12 DIAGNOSIS — J189 Pneumonia, unspecified organism: Secondary | ICD-10-CM

## 2016-06-12 LAB — UA/M W/RFLX CULTURE, ROUTINE
BILIRUBIN UA: NEGATIVE
Glucose, UA: NEGATIVE
Ketones, UA: NEGATIVE
LEUKOCYTES UA: NEGATIVE
Nitrite, UA: NEGATIVE
PH UA: 5.5 (ref 5.0–7.5)
PROTEIN UA: NEGATIVE
RBC UA: NEGATIVE
Specific Gravity, UA: 1.02 (ref 1.005–1.030)
Urobilinogen, Ur: 0.2 mg/dL (ref 0.2–1.0)

## 2016-06-12 LAB — MICROALBUMIN, URINE WAIVED
CREATININE, URINE WAIVED: 300 mg/dL (ref 10–300)
MICROALB, UR WAIVED: 30 mg/L — AB (ref 0–19)
Microalb/Creat Ratio: <30 mg/g (ref <30)

## 2016-06-12 LAB — MICROSCOPIC EXAMINATION
BACTERIA UA: NONE SEEN
RBC, UA: NONE SEEN /hpf (ref 0–?)

## 2016-06-12 MED ORDER — ALBUTEROL SULFATE HFA 108 (90 BASE) MCG/ACT IN AERS: 2.0000 | INHALATION_SPRAY | RESPIRATORY_TRACT | 6 refills | Status: DC | PRN

## 2016-06-12 MED ORDER — BUPROPION HCL ER (SR) 150 MG PO TB12: ORAL_TABLET | ORAL | 2 refills | Status: DC

## 2016-06-12 MED ORDER — TIOTROPIUM BROMIDE MONOHYDRATE 18 MCG IN CAPS: 18.0000 ug | ORAL_CAPSULE | Freq: Every day | RESPIRATORY_TRACT | 12 refills | Status: DC

## 2016-06-12 MED ORDER — FLUTICASONE PROPIONATE 50 MCG/ACT NA SUSP: 2.0000 | Freq: Every day | NASAL | 6 refills | Status: DC

## 2016-06-12 MED ORDER — PREDNISONE 10 MG PO TABS: ORAL_TABLET | ORAL | 0 refills | Status: DC

## 2016-06-12 MED ORDER — FLUTICASONE-SALMETEROL 250-50 MCG/DOSE IN AEPB: INHALATION_SPRAY | RESPIRATORY_TRACT | 6 refills | Status: DC

## 2016-06-12 MED ORDER — PRAVASTATIN SODIUM 40 MG PO TABS: 40.0000 mg | ORAL_TABLET | Freq: Every day | ORAL | 1 refills | Status: DC

## 2016-06-12 MED ORDER — LISINOPRIL-HYDROCHLOROTHIAZIDE 10-12.5 MG PO TABS: 1.0000 | ORAL_TABLET | Freq: Every day | ORAL | 1 refills | Status: DC

## 2016-06-12 NOTE — Assessment & Plan Note (Signed)
Not under good control. Will start spiriva and recheck with spiro in 1 month.

## 2016-06-12 NOTE — Telephone Encounter (Signed)
No medication attached to request 

## 2016-06-12 NOTE — Assessment & Plan Note (Signed)
Rechecking levels today. Await results. Continue current regimen.  

## 2016-06-12 NOTE — Patient Instructions (Addendum)
Steps to Quit Smoking Smoking tobacco can be bad for your health. It can also affect almost every organ in your body. Smoking puts you and people around you at risk for many serious long-lasting (chronic) diseases. Quitting smoking is hard, but it is one of the best things that you can do for your health. It is never too late to quit. What are the benefits of quitting smoking? When you quit smoking, you lower your risk for getting serious diseases and conditions. They can include:  Lung cancer or lung disease.  Heart disease.  Stroke.  Heart attack.  Not being able to have children (infertility).  Weak bones (osteoporosis) and broken bones (fractures). If you have coughing, wheezing, and shortness of breath, those symptoms may get better when you quit. You may also get sick less often. If you are pregnant, quitting smoking can help to lower your chances of having a baby of low birth weight. What can I do to help me quit smoking? Talk with your doctor about what can help you quit smoking. Some things you can do (strategies) include:  Quitting smoking totally, instead of slowly cutting back how much you smoke over a period of time.  Going to in-person counseling. You are more likely to quit if you go to many counseling sessions.  Using resources and support systems, such as:  Online chats with a counselor.  Phone quitlines.  Printed self-help materials.  Support groups or group counseling.  Text messaging programs.  Mobile phone apps or applications.  Taking medicines. Some of these medicines may have nicotine in them. If you are pregnant or breastfeeding, do not take any medicines to quit smoking unless your doctor says it is okay. Talk with your doctor about counseling or other things that can help you. Talk with your doctor about using more than one strategy at the same time, such as taking medicines while you are also going to in-person counseling. This can help make quitting  easier. What things can I do to make it easier to quit? Quitting smoking might feel very hard at first, but there is a lot that you can do to make it easier. Take these steps:  Talk to your family and friends. Ask them to support and encourage you.  Call phone quitlines, reach out to support groups, or work with a counselor.  Ask people who smoke to not smoke around you.  Avoid places that make you want (trigger) to smoke, such as:  Bars.  Parties.  Smoke-break areas at work.  Spend time with people who do not smoke.  Lower the stress in your life. Stress can make you want to smoke. Try these things to help your stress:  Getting regular exercise.  Deep-breathing exercises.  Yoga.  Meditating.  Doing a body scan. To do this, close your eyes, focus on one area of your body at a time from head to toe, and notice which parts of your body are tense. Try to relax the muscles in those areas.  Download or buy apps on your mobile phone or tablet that can help you stick to your quit plan. There are many free apps, such as QuitGuide from the CDC (Centers for Disease Control and Prevention). You can find more support from smokefree.gov and other websites. This information is not intended to replace advice given to you by your health care provider. Make sure you discuss any questions you have with your health care provider. Document Released: 01/18/2009 Document Revised: 11/20/2015 Document   Reviewed: 08/08/2014 Elsevier Interactive Patient Education  2017 [employer].  

## 2016-06-12 NOTE — Progress Notes (Signed)
BP 118/77 (BP Location: Left Arm, Patient Position: Sitting, Cuff Size: Normal)   Pulse 91   Temp 98.1 F (36.7 C) (Oral)   Resp 17   Ht 5' 0.2" (1.529 m)   Wt 203 lb (92.1 kg)   LMP  (LMP Unknown)   SpO2 98%   BMI 39.38 kg/m    Subjective:    Patient ID: Nancy Stout, female    DOB: November 09, 1965, 51 y.o.   MRN: [id number]  HPI: Nancy Stout is a [age] y.o. female  Chief Complaint  Patient presents with  . Follow-up    Urgent care visit for cough and congestion   HYPERTENSION / HYPERLIPIDEMIA Satisfied with current treatment? yes Duration of hypertension: chronic BP monitoring frequency: not checking BP range:  BP medication side effects: no Past BP meds: lisinopril- hctz Duration of hyperlipidemia: chronic Cholesterol medication side effects: no Cholesterol supplements: none Past cholesterol medications: pravastatin Medication compliance: excellent compliance Aspirin: no Recent stressors: no Recurrent headaches: no Visual changes: no Palpitations: no Dyspnea: no Chest pain: no Lower extremity edema: no Dizzy/lightheaded: no  URGENT CARE FOLLOW UP Time since discharge: 3 days ago Diagnosis: Bronchitis Procedures/tests: None Consultants: None New medications: prednisone, amoxicillin Discharge instructions:  Follow up here within 3 days Status: better  ASTHMA- recently at urgent care for cough and congestion. Asthma status: exacerbated Satisfied with current treatment?: yes Albuterol/rescue inhaler frequency: about every 6 hours Dyspnea frequency: 1x a week Wheezing frequency: once every 1-2 weeks Cough frequency: every day Nocturnal symptom frequency: every day Limitation of activity: yes Current upper respiratory symptoms: yes Triggers: smoking Aerochamber/spacer use: no Visits to ER or Urgent Care in past year: yes Pneumovax: Up to Date Influenza: Not up to Date  SMOKING CESSATION Smoking Status: current every day smoker Smoking Amount:  10 cigs/ day Smoking Onset: [age] yo Smoking Quit Date: Not set Smoking triggers: Stress, after eating, driving Type of tobacco use: cigarettes Children in the house: yes Other household members who smoke: no Treatments attempted: cold [place]  Pneumovax: up to date  Relevant past medical, surgical, family and social history reviewed and updated as indicated. Interim medical history since our last visit reviewed. Allergies and medications reviewed and updated.  Review of Systems  Constitutional: Negative.   HENT: Positive for ear pain. Negative for congestion, dental problem, drooling, ear discharge, facial swelling, hearing loss, mouth sores, nosebleeds, postnasal drip, rhinorrhea, sinus pain, sinus pressure, sneezing, sore throat, tinnitus, trouble swallowing and voice change.   Respiratory: Positive for cough, shortness of breath and wheezing. Negative for apnea, choking, chest tightness and stridor.   Cardiovascular: Negative.   Psychiatric/Behavioral: Negative.    Per HPI unless specifically indicated above     Objective:    BP 118/77 (BP Location: Left Arm, Patient Position: Sitting, Cuff Size: Normal)   Pulse 91   Temp 98.1 F (36.7 C) (Oral)   Resp 17   Ht 5' 0.2" (1.529 m)   Wt 203 lb (92.1 kg)   LMP  (LMP Unknown)   SpO2 98%   BMI 39.38 kg/m   Wt Readings from Last 3 Encounters:  06/12/16 203 lb (92.1 kg)  07/04/15 210 lb (95.3 kg)  01/24/15 209 lb (94.8 kg)    Physical Exam  Constitutional: She is oriented to person, place, and time. She appears well-developed and well-nourished. No distress.  HENT:  Head: Normocephalic and atraumatic.  Right Ear: Hearing normal.  Left Ear: Hearing normal.  Nose: Nose normal.  Eyes: Conjunctivae and lids are  normal. Right eye exhibits no discharge. Left eye exhibits no discharge. No scleral icterus.  Cardiovascular: Normal rate, regular rhythm, normal heart sounds and intact distal pulses.  Exam reveals no gallop and no  friction rub.   No murmur heard. Pulmonary/Chest: Effort normal. No respiratory distress. She has no decreased breath sounds. She has wheezes in the right upper field, the right middle field, the right lower field, the left upper field, the left middle field and the left lower field. She has rhonchi in the left lower field. She has no rales. She exhibits no tenderness.  Musculoskeletal: Normal range of motion.  Neurological: She is alert and oriented to person, place, and time.  Skin: Skin is intact. No rash noted.  Psychiatric: She has a normal mood and affect. Her speech is normal and behavior is normal. Judgment and thought content normal. Cognition and memory are normal.  Nursing note and vitals reviewed.      Assessment & Plan:   Problem List Items Addressed This Visit      Respiratory   Asthma - Primary    Not under good control. Will start spiriva and recheck with spiro in 1 month.       Relevant Medications   albuterol (PROAIR HFA) 108 (90 Base) MCG/ACT inhaler   Fluticasone-Salmeterol (ADVAIR DISKUS) 250-50 MCG/DOSE AEPB   tiotropium (SPIRIVA HANDIHALER) 18 MCG inhalation capsule   predniSONE (DELTASONE) 10 MG tablet   Other Relevant Orders   CBC with Differential/Platelet   Comprehensive metabolic panel   UA/M w/rflx Culture, Routine     Genitourinary   Benign hypertensive renal disease    Under good control. Continue current regimen and recheck in 6 months.       Relevant Orders   CBC with Differential/Platelet   Comprehensive metabolic panel   Microalbumin, Urine Waived   TSH   UA/M w/rflx Culture, Routine     Other   Hyperlipidemia    Rechecking levels today. Await results. Continue current regimen.       Relevant Medications   pravastatin (PRAVACHOL) 40 MG tablet   lisinopril-hydrochlorothiazide (PRINZIDE,ZESTORETIC) 10-12.5 MG tablet   Other Relevant Orders   CBC with Differential/Platelet   Comprehensive metabolic panel   Lipid Panel w/o Chol/HDL  Ratio   UA/M w/rflx Culture, Routine   Tobacco abuse    Will try wellbutrin to try to help her quit smoking. Recheck 1 month.       Polycythemia    Rechecking levels today, await results.       Relevant Orders   CBC with Differential/Platelet   Comprehensive metabolic panel   UA/M w/rflx Culture, Routine    Other Visit Diagnoses    Right acute serous otitis media, recurrence not specified       Currently on amoxicillin 875mg  BID for 10 days- still has 6 days left. Will continue current regimen and recheck at lung follow up in 2 weeks.    Pneumonia of left lower lobe due to infectious organism [facility])       Will do longer course of prednisone. Continue amoxicliin. Recheck 2 weeks.    Relevant Medications   Chlorpheniramine Maleate (ALLERGY PO)   DiphenhydrAMINE HCl (ALLERGY MED PO)   albuterol (PROAIR HFA) 108 (90 Base) MCG/ACT inhaler   Fluticasone-Salmeterol (ADVAIR DISKUS) 250-50 MCG/DOSE AEPB   fluticasone (FLONASE) 50 MCG/ACT nasal spray   tiotropium (SPIRIVA HANDIHALER) 18 MCG inhalation capsule       Follow up plan: Return 2 weeks, for Lung recheck.

## 2016-06-12 NOTE — Assessment & Plan Note (Signed)
Rechecking levels today, await results.  

## 2016-06-12 NOTE — Assessment & Plan Note (Signed)
Will try wellbutrin to try to help her quit smoking. Recheck 1 month.

## 2016-06-12 NOTE — Assessment & Plan Note (Signed)
Under good control. Continue current regimen and recheck in 6 months.

## 2016-06-13 ENCOUNTER — Encounter: Payer: Self-pay | Admitting: Family Medicine

## 2016-06-13 LAB — LIPID PANEL W/O CHOL/HDL RATIO
Cholesterol, Total: 199 mg/dL (ref 100–199)
HDL: 49 mg/dL (ref >39)
LDL CALC: 126 mg/dL — AB (ref 0–99)
TRIGLYCERIDES: 120 mg/dL (ref 0–149)
VLDL Cholesterol Cal: 24 mg/dL (ref 5–40)

## 2016-06-13 LAB — CBC WITH DIFFERENTIAL/PLATELET
BASOS ABS: 0 10*3/uL (ref 0.0–0.2)
Basos: 1 %
EOS (ABSOLUTE): 0 10*3/uL (ref 0.0–0.4)
Eos: 0 %
Hematocrit: 45 % (ref 34.0–46.6)
Hemoglobin: 15.9 g/dL (ref 11.1–15.9)
IMMATURE GRANS (ABS): 0 10*3/uL (ref 0.0–0.1)
IMMATURE GRANULOCYTES: 0 %
LYMPHS: 13 %
Lymphocytes Absolute: 1 10*3/uL (ref 0.7–3.1)
MCH: 33 pg (ref 26.6–33.0)
MCHC: 35.3 g/dL (ref 31.5–35.7)
MCV: 93 fL (ref 79–97)
MONOCYTES: 4 %
Monocytes Absolute: 0.3 10*3/uL (ref 0.1–0.9)
NEUTROS ABS: 6.2 10*3/uL (ref 1.4–7.0)
Neutrophils: 82 %
PLATELETS: 343 10*3/uL (ref 150–379)
RBC: 4.82 x10E6/uL (ref 3.77–5.28)
RDW: 14.1 % (ref 12.3–15.4)
WBC: 7.5 10*3/uL (ref 3.4–10.8)

## 2016-06-13 LAB — COMPREHENSIVE METABOLIC PANEL
A/G RATIO: 1.3 (ref 1.2–2.2)
ALT: 31 IU/L (ref 0–32)
AST: 22 IU/L (ref 0–40)
Albumin: 4.3 g/dL (ref 3.5–5.5)
Alkaline Phosphatase: 119 IU/L — ABNORMAL HIGH (ref 39–117)
BUN/Creatinine Ratio: 33 — ABNORMAL HIGH (ref 9–23)
BUN: 23 mg/dL (ref 6–24)
Bilirubin Total: 0.4 mg/dL (ref 0.0–1.2)
CALCIUM: 9.5 mg/dL (ref 8.7–10.2)
CO2: 25 mmol/L (ref 18–29)
Chloride: 96 mmol/L (ref 96–106)
Creatinine, Ser: 0.69 mg/dL (ref 0.57–1.00)
GFR, EST AFRICAN AMERICAN: 117 mL/min/{1.73_m2} (ref >59)
GFR, EST NON AFRICAN AMERICAN: 102 mL/min/{1.73_m2} (ref >59)
Globulin, Total: 3.3 g/dL (ref 1.5–4.5)
Glucose: 122 mg/dL — ABNORMAL HIGH (ref 65–99)
POTASSIUM: 4.4 mmol/L (ref 3.5–5.2)
Sodium: 139 mmol/L (ref 134–144)
TOTAL PROTEIN: 7.6 g/dL (ref 6.0–8.5)

## 2016-06-13 LAB — TSH: TSH: 0.43 u[IU]/mL — ABNORMAL LOW (ref 0.450–4.500)

## 2016-06-20 ENCOUNTER — Telehealth: Payer: Self-pay | Admitting: Family Medicine

## 2016-06-20 MED ORDER — DOXYCYCLINE HYCLATE 100 MG PO TABS: 100.0000 mg | ORAL_TABLET | Freq: Two times a day (BID) | ORAL | 0 refills | Status: DC

## 2016-06-20 NOTE — Telephone Encounter (Signed)
Called in a different antibiotic for her continued ear issues, and looks like she should be following up next week per Dr. Henriette CombsJohnson's last note

## 2016-06-20 NOTE — Telephone Encounter (Signed)
Patient notified

## 2016-06-30 ENCOUNTER — Ambulatory Visit: Payer: Medicaid Other | Admitting: Family Medicine

## 2016-07-07 ENCOUNTER — Ambulatory Visit: Payer: Medicaid Other | Admitting: Family Medicine

## 2016-07-07 NOTE — Progress Notes (Deleted)
LMP  (LMP Unknown)    Subjective:    Patient ID: Nancy Stout, female    DOB: 1965-09-16, 51 y.o.   MRN: [id number]  HPI: Nancy Stout is a 51 y.o. female  No chief complaint on file.   Relevant past medical, surgical, family and social history reviewed and updated as indicated. Interim medical history since our last visit reviewed. Allergies and medications reviewed and updated.  Review of Systems  Per HPI unless specifically indicated above     Objective:    LMP  (LMP Unknown)   Wt Readings from Last 3 Encounters:  06/12/16 203 lb (92.1 kg)  07/04/15 210 lb (95.3 kg)  01/24/15 209 lb (94.8 kg)    Physical Exam  Results for orders placed or performed in visit on 06/12/16  Microscopic Examination  Result Value Ref Range   WBC, UA 0-5 0 - 5 /hpf   RBC, UA None seen 0 - 2 /hpf   Epithelial Cells (non renal) 0-10 0 - 10 /hpf   Bacteria, UA None seen None seen/Few  CBC with Differential/Platelet  Result Value Ref Range   WBC 7.5 3.4 - 10.8 x10E3/uL   RBC 4.82 3.77 - 5.28 x10E6/uL   Hemoglobin 15.9 11.1 - 15.9 g/dL   Hematocrit 40.9 81.1 - 46.6 %   MCV 93 79 - 97 fL   MCH 33.0 26.6 - 33.0 pg   MCHC 35.3 31.5 - 35.7 g/dL   RDW 91.4 78.2 - 95.6 %   Platelets 343 150 - 379 x10E3/uL   Neutrophils 82 Not Estab. %   Lymphs 13 Not Estab. %   Monocytes 4 Not Estab. %   Eos 0 Not Estab. %   Basos 1 Not Estab. %   Neutrophils Absolute 6.2 1.4 - 7.0 x10E3/uL   Lymphocytes Absolute 1.0 0.7 - 3.1 x10E3/uL   Monocytes Absolute 0.3 0.1 - 0.9 x10E3/uL   EOS (ABSOLUTE) 0.0 0.0 - 0.4 x10E3/uL   Basophils Absolute 0.0 0.0 - 0.2 x10E3/uL   Immature Granulocytes 0 Not Estab. %   Immature Grans (Abs) 0.0 0.0 - 0.1 x10E3/uL   Hematology Comments: Note:   Comprehensive metabolic panel  Result Value Ref Range   Glucose 122 (H) 65 - 99 mg/dL   BUN 23 6 - 24 mg/dL   Creatinine, Ser 2.13 0.57 - 1.00 mg/dL   GFR calc non Af Amer 102 >59 mL/min/1.73   GFR calc Af Amer 117  >59 mL/min/1.73   BUN/Creatinine Ratio 33 (H) 9 - 23   Sodium 139 134 - 144 mmol/L   Potassium 4.4 3.5 - 5.2 mmol/L   Chloride 96 96 - 106 mmol/L   CO2 25 18 - 29 mmol/L   Calcium 9.5 8.7 - 10.2 mg/dL   Total Protein 7.6 6.0 - 8.5 g/dL   Albumin 4.3 3.5 - 5.5 g/dL   Globulin, Total 3.3 1.5 - 4.5 g/dL   Albumin/Globulin Ratio 1.3 1.2 - 2.2   Bilirubin Total 0.4 0.0 - 1.2 mg/dL   Alkaline Phosphatase 119 (H) 39 - 117 IU/L   AST 22 0 - 40 IU/L   ALT 31 0 - 32 IU/L  Lipid Panel w/o Chol/HDL Ratio  Result Value Ref Range   Cholesterol, Total 199 100 - 199 mg/dL   Triglycerides 086 0 - 149 mg/dL   HDL 49 >57 mg/dL   VLDL Cholesterol Cal 24 5 - 40 mg/dL   LDL Calculated 846 (H) 0 - 99 mg/dL  Microalbumin, Urine Waived  Result Value Ref Range   Microalb, Ur Waived 30 (H) 0 - 19 mg/L   Creatinine, Urine Waived 300 10 - 300 mg/dL   Microalb/Creat Ratio <30 <30 mg/g  TSH  Result Value Ref Range   TSH 0.430 (L) 0.450 - 4.500 uIU/mL  UA/M w/rflx Culture, Routine  Result Value Ref Range   Specific Gravity, UA 1.020 1.005 - 1.030   pH, UA 5.5 5.0 - 7.5   Color, UA Yellow Yellow   Appearance Ur Clear Clear   Leukocytes, UA Negative Negative   Protein, UA Negative Negative/Trace   Glucose, UA Negative Negative   Ketones, UA Negative Negative   RBC, UA Negative Negative   Bilirubin, UA Negative Negative   Urobilinogen, Ur 0.2 0.2 - 1.0 mg/dL   Nitrite, UA Negative Negative   Microscopic Examination See below:       Assessment & Plan:   Problem List Items Addressed This Visit      Respiratory   Asthma - Primary     Other   Tobacco abuse    Other Visit Diagnoses    Right acute serous otitis media, recurrence not specified       Pneumonia of left lower lobe due to infectious organism (HCC)           Follow up plan: No Follow-up on file.

## 2016-09-26 ENCOUNTER — Other Ambulatory Visit: Payer: Self-pay | Admitting: Family Medicine

## 2017-02-28 ENCOUNTER — Other Ambulatory Visit: Payer: Self-pay | Admitting: Family Medicine

## 2017-03-02 ENCOUNTER — Other Ambulatory Visit: Payer: Self-pay | Admitting: Family Medicine

## 2017-04-06 ENCOUNTER — Other Ambulatory Visit: Payer: Self-pay

## 2017-04-06 ENCOUNTER — Encounter: Payer: Self-pay | Admitting: Emergency Medicine

## 2017-04-06 ENCOUNTER — Emergency Department: Payer: Medicaid Other

## 2017-04-06 ENCOUNTER — Emergency Department
Admission: EM | Admit: 2017-04-06 | Discharge: 2017-04-06 | Disposition: A | Discharge status: Home / Self Care | Payer: Medicaid Other | Attending: Emergency Medicine | Admitting: Emergency Medicine

## 2017-04-06 DIAGNOSIS — T1490XA Injury, unspecified, initial encounter: Secondary | ICD-10-CM

## 2017-04-06 DIAGNOSIS — F17210 Nicotine dependence, cigarettes, uncomplicated: Secondary | ICD-10-CM | POA: Insufficient documentation

## 2017-04-06 DIAGNOSIS — Y999 Unspecified external cause status: Secondary | ICD-10-CM | POA: Insufficient documentation

## 2017-04-06 DIAGNOSIS — Y93G1 Activity, food preparation and clean up: Secondary | ICD-10-CM | POA: Insufficient documentation

## 2017-04-06 DIAGNOSIS — Z79899 Other long term (current) drug therapy: Secondary | ICD-10-CM | POA: Insufficient documentation

## 2017-04-06 DIAGNOSIS — W269XXA Contact with unspecified sharp object(s), initial encounter: Secondary | ICD-10-CM | POA: Insufficient documentation

## 2017-04-06 DIAGNOSIS — J45909 Unspecified asthma, uncomplicated: Secondary | ICD-10-CM | POA: Insufficient documentation

## 2017-04-06 DIAGNOSIS — Z23 Encounter for immunization: Secondary | ICD-10-CM | POA: Diagnosis not present

## 2017-04-06 DIAGNOSIS — Y92009 Unspecified place in unspecified non-institutional (private) residence as the place of occurrence of the external cause: Secondary | ICD-10-CM | POA: Insufficient documentation

## 2017-04-06 DIAGNOSIS — S61210A Laceration without foreign body of right index finger without damage to nail, initial encounter: Secondary | ICD-10-CM

## 2017-04-06 DIAGNOSIS — J449 Chronic obstructive pulmonary disease, unspecified: Secondary | ICD-10-CM | POA: Diagnosis not present

## 2017-04-06 DIAGNOSIS — I10 Essential (primary) hypertension: Secondary | ICD-10-CM | POA: Insufficient documentation

## 2017-04-06 MED ORDER — TETANUS-DIPHTH-ACELL PERTUSSIS 5-2.5-18.5 LF-MCG/0.5 IM SUSP
0.5000 mL | Freq: Once | INTRAMUSCULAR | Status: AC
  Administered 2017-04-06: 0.5 mL via INTRAMUSCULAR
  Filled 2017-04-06: qty 0.5

## 2017-04-06 MED ORDER — CEPHALEXIN 500 MG PO CAPS: 500.0000 mg | ORAL_CAPSULE | Freq: Four times a day (QID) | ORAL | 0 refills | Status: AC

## 2017-04-06 MED ORDER — LIDOCAINE HCL (PF) 1 % IJ SOLN
INTRAMUSCULAR | Status: AC
  Filled 2017-04-06: qty 5

## 2017-04-06 MED ORDER — CEPHALEXIN 500 MG PO CAPS
500.0000 mg | ORAL_CAPSULE | Freq: Once | ORAL | Status: AC
  Administered 2017-04-06: 500 mg via ORAL
  Filled 2017-04-06: qty 1

## 2017-04-06 MED ORDER — LIDOCAINE HCL (PF) 1 % IJ SOLN
5.0000 mL | Freq: Once | INTRAMUSCULAR | Status: DC
  Filled 2017-04-06: qty 5

## 2017-04-06 NOTE — ED Provider Notes (Signed)
[facility] Emergency Department Provider Note  ____________________________________________  Time seen: Approximately 10:50 PM  I have reviewed the triage vital signs and the nursing notes.   HISTORY  Chief Complaint Laceration    HPI Nancy Stout is a 51 y.o. female that presents to the emergency department for evaluation of right finger laceration.  Patient was at her new years party and was cleaning a pot when it broke and cut her finger.  She is not having any difficulty moving finger.  She is not concerned that it is broken.  Last tetanus shot was 7 years ago.  No numbness, tingling.   Past Medical History:  Diagnosis Date  . Asthma   . COPD (chronic obstructive pulmonary disease) (HCC)   . GERD (gastroesophageal reflux disease)   . Hyperlipidemia   . Hypertension   . Low blood potassium    Due to Stomach Virus  . Low serum sodium    Due to stomach virus    Patient Active Problem List   Diagnosis Date Noted  . Eczema of both hands 07/04/2015  . Allergic rhinitis 07/04/2015  . Polycythemia 01/22/2015  . Tobacco abuse 01/17/2015  . Asthma   . Benign hypertensive renal disease 01/11/2015  . Hyperlipidemia 01/11/2015    Past Surgical History:  Procedure Laterality Date  . CESAREAN SECTION    . INCISION AND DRAINAGE ABSCESS Left    leg  . TONSILLECTOMY      Prior to Admission medications   Medication Sig Start Date End Date Taking? Authorizing Provider  albuterol (PROAIR HFA) 108 (90 Base) MCG/ACT inhaler Inhale 2 puffs into the lungs every 4 (four) hours as needed. 06/12/16   Johnson, Megan P, DO  albuterol (PROAIR HFA) 108 (90 Base) MCG/ACT inhaler INHALE 2 PUFFS BY MOUTH EVERY 4 HOURS AS NEEDED 03/02/17   Johnson, Megan P, DO  albuterol (PROVENTIL) (2.5 MG/3ML) 0.083% nebulizer solution USE 1 VIAL IN NEBULIZER EVERY 4 HOURS 07/19/15   Johnson, Megan P, DO  buPROPion (WELLBUTRIN SR) 150 MG 12 hr tablet 1 pill qAM for 3 days, then  increase to 1 pill BID, pick a day in the 2nd week to quit 06/12/16   Olevia PerchesJohnson, Megan P, DO  cephALEXin (KEFLEX) 500 MG capsule Take 1 capsule (500 mg total) by mouth 4 (four) times daily for 10 days. 04/06/17 04/16/17  Enid DerryWagner, Naftoli Penny, PA-C  Chlorpheniramine Maleate (ALLERGY PO) Take by mouth daily.    [provider]  Cyanocobalamin (VITAMIN B 12 PO) Take by mouth.    [provider]  DiphenhydrAMINE HCl (ALLERGY MED PO) Take by mouth daily.    [provider]  doxycycline (VIBRA-TABS) 100 MG tablet Take 1 tablet (100 mg total) by mouth 2 (two) times daily. 06/20/16   Particia NearingLane, Rachel Elizabeth, PA-C  fluticasone [facility](FLONASE) 50 MCG/ACT nasal spray Place 2 sprays into both nostrils daily. 06/12/16   Johnson, Megan P, DO  Fluticasone-Salmeterol (ADVAIR DISKUS) 250-50 MCG/DOSE AEPB INHALE 1 PUFF INTO THE LUNGS 2 (TWO) TIMES DAILY. 06/12/16   Johnson, Megan P, DO  lisinopril-hydrochlorothiazide (PRINZIDE,ZESTORETIC) 10-12.5 MG tablet Take 1 tablet by mouth daily. Please call the office for an appointment for more refills. 03/02/17   Johnson, Megan P, DO  pravastatin (PRAVACHOL) 40 MG tablet Take 1 tablet (40 mg total) by mouth daily. Please call the office for an appointment for more refills. 03/02/17   Johnson, Megan P, DO  predniSONE (DELTASONE) 10 MG tablet 6 tabs today, 5 tabs tomorrow, decrease by 1  every day until gone 06/12/16   Johnson, Megan P, DO  PROAIR HFA 108 (90 Base) MCG/ACT inhaler INHALE 2 PUFFS BY MOUTH EVERY 4 HOURS AS NEEDED 09/26/16   Laural BenesJohnson, Megan P, DO  tiotropium (SPIRIVA HANDIHALER) 18 MCG inhalation capsule Place 1 capsule (18 mcg total) into inhaler and inhale daily. 06/12/16   Olevia PerchesJohnson, Megan P, DO    Allergies Sulfa antibiotics  Family History  Problem Relation Age of Onset  . Hyperlipidemia Mother   . Hypertension Mother   . Cancer Father        Lung  . Hypertension Father   . Hyperlipidemia Sister   . Drug abuse Son     Social History Social History    Tobacco Use  . Smoking status: Current Every Day Smoker    Packs/day: 0.25    Types: Cigarettes  . Smokeless tobacco: Never Used  Substance Use Topics  . Alcohol use: Yes    Alcohol/week: 0.0 oz    Comment: one or less per day  . Drug use: No     Review of Systems  Constitutional: No fever/chills Cardiovascular: No chest pain. Respiratory: No SOB. Musculoskeletal: Positive for finger pain. Skin: Negative for rash, ecchymosis. Neurological: Negative for headaches, numbness or tingling   ____________________________________________   PHYSICAL EXAM:  VITAL SIGNS: ED Triage Vitals  Enc Vitals Group     BP 04/06/17 2144 (!) 129/92     Pulse Rate 04/06/17 2144 80     Resp 04/06/17 2144 20     Temp 04/06/17 2144 97.7 F (36.5 C)     Temp Source 04/06/17 2144 Oral     SpO2 04/06/17 2144 95 %     Weight 04/06/17 2146 210 lb (95.3 kg)     Height 04/06/17 2146 5\' 1"  (1.549 m)     Head Circumference --      Peak Flow --      Pain Score --      Pain Loc --      Pain Edu? --      Excl. in GC? --      Constitutional: Alert and oriented. Well appearing and in no acute distress. Eyes: Conjunctivae are normal. PERRL. EOMI. Head: Atraumatic. ENT:      Ears:      Nose: No congestion/rhinnorhea.      Mouth/Throat: Mucous membranes are moist.  Neck: No stridor.   Cardiovascular: Normal rate, regular rhythm.  Good peripheral circulation. Respiratory: Normal respiratory effort without tachypnea or retractions. Lungs CTAB. Good air entry to the bases with no decreased or absent breath sounds. Musculoskeletal: Full range of motion to all extremities. No gross deformities appreciated. Neurologic:  Normal speech and language. No gross focal neurologic deficits are appreciated.  Skin:  Skin is warm, dry.  Laceration at base of fifth finger on palmar side.    ____________________________________________   LABS (all labs ordered are listed, but only abnormal results are  displayed)  Labs Reviewed - No data to display ____________________________________________  EKG   ____________________________________________  RADIOLOGY Lexine BatonI, Soumya Colson, personally viewed and evaluated these images (plain radiographs) as part of my medical decision making, as well as reviewing the written report by the radiologist.  Dg Hand Complete Right  Result Date: 04/06/2017 CLINICAL DATA:  Injury to the right hand while cleaning a porcelain pot. The pot broke with laceration to the right little finger. EXAM: RIGHT HAND - COMPLETE 3+ VIEW COMPARISON:  None. FINDINGS: Mild degenerative changes in the interphalangeal joints of  the right hand. No evidence of acute fracture or dislocation. No focal bone lesion or bone destruction. Soft tissues are unremarkable. No radiopaque soft tissue foreign bodies. IMPRESSION: Degenerative changes in the right hand. No acute bony abnormalities. No radiopaque soft tissue foreign bodies. Electronically Signed   By: Burman Nieves M.D.   On: 04/06/2017 22:21    ____________________________________________    PROCEDURES  Procedure(s) performed:    Procedures  LACERATION REPAIR Performed by: Enid Derry  Consent: Verbal consent obtained.  Consent given by: patient  Prepped and Draped in normal sterile fashion  Wound explored: No foreign bodies   Laceration Location: 5th finger  Laceration Length: 1.5 cm  Anesthesia: None  Local anesthetic: lidocaine 1% without epinephrine  Anesthetic total: 4 ml  Irrigation method: syringe  Amount of cleaning: normal saline  Skin closure: 4-0 nylon  Number of sutures: 5  Technique: Simple interrupted  Patient tolerance: Patient tolerated the procedure well with no immediate complications.  Medications  lidocaine (PF) (XYLOCAINE) 1 % injection 5 mL (not administered)  cephALEXin (KEFLEX) capsule 500 mg (not administered)  Tdap (BOOSTRIX) injection 0.5 mL (not administered)      ____________________________________________   INITIAL IMPRESSION / ASSESSMENT AND PLAN / ED COURSE  Pertinent labs & imaging results that were available during my care of the patient were reviewed by me and considered in my medical decision making (see chart for details).  Review of the [place] CSRS was performed in accordance of the [facility] prior to dispensing any controlled drugs.   Patient's diagnosis is consistent with finger laceration.  Vital signs and exam are reassuring.  Laceration was repaired with stitches.  Splint was placed.  Tetanus shot was updated.  Patient will be discharged home with prescriptions for Keflex. Patient is to follow up with PCP as directed. Patient is given ED precautions to return to the ED for any worsening or new symptoms.     ____________________________________________  FINAL CLINICAL IMPRESSION(S) / ED DIAGNOSES  Final diagnoses:  Laceration of right index finger without foreign body without damage to nail, initial encounter      NEW MEDICATIONS STARTED DURING THIS VISIT:  ED Discharge Orders        Ordered    cephALEXin (KEFLEX) 500 MG capsule  4 times daily     04/06/17 2245          This chart was dictated using voice recognition software/Dragon. Despite best efforts to proofread, errors can occur which can change the meaning. Any change was purely unintentional.    Enid Derry, PA-C 04/06/17 2324    Merrily Brittle, MD 04/06/17 2328

## 2017-04-06 NOTE — ED Triage Notes (Signed)
Pt reports she was cleaning her porcelain pot and hit the edge of it on sink and broke pt has laceration to right 5th digit, active bleeding with movement. Pt denies any other symptoms at present.

## 2017-04-08 ENCOUNTER — Telehealth: Payer: Self-pay | Admitting: Family Medicine

## 2017-04-08 NOTE — Telephone Encounter (Signed)
Phone call from pt. with concern about bright red blood on dressing of right 5th finger.  Reported she had sutures placed in the ER on 12/31, due to laceration from a crock pot.  Stated she got the bandage damp yesterday, when washing dishes.  Reported some bright red blood on the bandage.  Denies that the bandage is saturated with BRB.  Stated she was advised not to remove the initial bandage, due to the risk of bleeding.    Attempted to contact FC at the office; unable to contact someone at this time.  Sent a Skype message re: need for work in appt. Today.  Recommended for pt. To mark the drainage on her present bandage, and to reinforce the bandage with 2 x 2" gauze, and kling gauze wrap.  Advised to elevate the right hand and forearm above level of heart.  Appt. Given at 9:15 AM 04/09/17 with PCP office.  Advised to go to ER if reinforced dressing becomes saturated with blood.  Pt. Verb. Understanding and agrees with plan.   Advised if office is able to work her in today, she will be notified.  Verb. Understanding.

## 2017-04-08 NOTE — Telephone Encounter (Signed)
Routing to provider's as an Financial plannerYI.

## 2017-04-09 ENCOUNTER — Encounter: Payer: Self-pay | Admitting: Family Medicine

## 2017-04-09 ENCOUNTER — Ambulatory Visit: Payer: Medicaid Other | Admitting: Family Medicine

## 2017-04-09 VITALS — BP 133/86 | HR 67 | Wt 210.0 lb

## 2017-04-09 DIAGNOSIS — S61216D Laceration without foreign body of right little finger without damage to nail, subsequent encounter: Secondary | ICD-10-CM

## 2017-04-09 NOTE — Progress Notes (Signed)
BP 133/86   Pulse 67   Wt 210 lb (95.3 kg)   LMP  (LMP Unknown)   SpO2 100%   BMI 39.68 kg/m    Subjective:    Patient ID: Nancy Stout, female    DOB: 10-Apr-1965, 52 y.o.   MRN: [id number]  HPI: Nancy Stout is a 52 y.o. female  Chief Complaint  Patient presents with  . Finger Injury   Pt here following up on right 5th digit laceration for which she went to the ER 04/06/17. States she was washing a crock pot and it had cracked, causing the lac. Sutures were placed, x-rays negative for FOB. Splint was taped to immobilize. Pt has been having a fair amount of pain in the finger and soaked through gauze the last day or two with blood and clear drainage. Concerned about infection. Denies fever, chills, streaking, thick discharge.   Relevant past medical, surgical, family and social history reviewed and updated as indicated. Interim medical history since our last visit reviewed. Allergies and medications reviewed and updated.  Review of Systems  Constitutional: Negative.   Respiratory: Negative.   Cardiovascular: Negative.   Musculoskeletal: Positive for arthralgias.  Skin: Positive for wound.  Neurological: Negative.   Psychiatric/Behavioral: Negative.    Per HPI unless specifically indicated above     Objective:    BP 133/86   Pulse 67   Wt 210 lb (95.3 kg)   LMP  (LMP Unknown)   SpO2 100%   BMI 39.68 kg/m   Wt Readings from Last 3 Encounters:  04/09/17 210 lb (95.3 kg)  04/06/17 210 lb (95.3 kg)  06/12/16 203 lb (92.1 kg)    Physical Exam  Constitutional: She is oriented to person, place, and time. She appears well-developed and well-nourished. No distress.  HENT:  Head: Atraumatic.  Eyes: Conjunctivae are normal. Pupils are equal, round, and reactive to light. No scleral icterus.  Neck: Normal range of motion. Neck supple.  Cardiovascular: Normal rate and normal heart sounds.  Pulmonary/Chest: Effort normal and breath sounds normal. No respiratory  distress.  Musculoskeletal: Normal range of motion.  Neurological: She is alert and oriented to person, place, and time.  Skin: Skin is warm and dry.  Well healing laceration of base of right 5th digit with simple sutures intact. No drainage, redness, warmth  Psychiatric: She has a normal mood and affect. Her behavior is normal.  Nursing note and vitals reviewed.  Results for orders placed or performed in visit on 06/12/16  Microscopic Examination  Result Value Ref Range   WBC, UA 0-5 0 - 5 /hpf   RBC, UA None seen 0 - 2 /hpf   Epithelial Cells (non renal) 0-10 0 - 10 /hpf   Bacteria, UA None seen None seen/Few  CBC with Differential/Platelet  Result Value Ref Range   WBC 7.5 3.4 - 10.8 x10E3/uL   RBC 4.82 3.77 - 5.28 x10E6/uL   Hemoglobin 15.9 11.1 - 15.9 g/dL   Hematocrit 40.9 81.1 - 46.6 %   MCV 93 79 - 97 fL   MCH 33.0 26.6 - 33.0 pg   MCHC 35.3 31.5 - 35.7 g/dL   RDW 91.4 78.2 - 95.6 %   Platelets 343 150 - 379 x10E3/uL   Neutrophils 82 Not Estab. %   Lymphs 13 Not Estab. %   Monocytes 4 Not Estab. %   Eos 0 Not Estab. %   Basos 1 Not Estab. %   Neutrophils Absolute 6.2 1.4 - 7.0 x10E3/uL  Lymphocytes Absolute 1.0 0.7 - 3.1 x10E3/uL   Monocytes Absolute 0.3 0.1 - 0.9 x10E3/uL   EOS (ABSOLUTE) 0.0 0.0 - 0.4 x10E3/uL   Basophils Absolute 0.0 0.0 - 0.2 x10E3/uL   Immature Granulocytes 0 Not Estab. %   Immature Grans (Abs) 0.0 0.0 - 0.1 x10E3/uL   Hematology Comments: Note:   Comprehensive metabolic panel  Result Value Ref Range   Glucose 122 (H) 65 - 99 mg/dL   BUN 23 6 - 24 mg/dL   Creatinine, Ser 4.090.69 0.57 - 1.00 mg/dL   GFR calc non Af Amer 102 >59 mL/min/1.73   GFR calc Af Amer 117 >59 mL/min/1.73   BUN/Creatinine Ratio 33 (H) 9 - 23   Sodium 139 134 - 144 mmol/L   Potassium 4.4 3.5 - 5.2 mmol/L   Chloride 96 96 - 106 mmol/L   CO2 25 18 - 29 mmol/L   Calcium 9.5 8.7 - 10.2 mg/dL   Total Protein 7.6 6.0 - 8.5 g/dL   Albumin 4.3 3.5 - 5.5 g/dL   Globulin,  Total 3.3 1.5 - 4.5 g/dL   Albumin/Globulin Ratio 1.3 1.2 - 2.2   Bilirubin Total 0.4 0.0 - 1.2 mg/dL   Alkaline Phosphatase 119 (H) 39 - 117 IU/L   AST 22 0 - 40 IU/L   ALT 31 0 - 32 IU/L  Lipid Panel w/o Chol/HDL Ratio  Result Value Ref Range   Cholesterol, Total 199 100 - 199 mg/dL   Triglycerides 811120 0 - 149 mg/dL   HDL 49 >91>39 mg/dL   VLDL Cholesterol Cal 24 5 - 40 mg/dL   LDL Calculated 478126 (H) 0 - 99 mg/dL  Microalbumin, Urine Waived  Result Value Ref Range   Microalb, Ur Waived 30 (H) 0 - 19 mg/L   Creatinine, Urine Waived 300 10 - 300 mg/dL   Microalb/Creat Ratio <30 <30 mg/g  TSH  Result Value Ref Range   TSH 0.430 (L) 0.450 - 4.500 uIU/mL  UA/M w/rflx Culture, Routine  Result Value Ref Range   Specific Gravity, UA 1.020 1.005 - 1.030   pH, UA 5.5 5.0 - 7.5   Color, UA Yellow Yellow   Appearance Ur Clear Clear   Leukocytes, UA Negative Negative   Protein, UA Negative Negative/Trace   Glucose, UA Negative Negative   Ketones, UA Negative Negative   RBC, UA Negative Negative   Bilirubin, UA Negative Negative   Urobilinogen, Ur 0.2 0.2 - 1.0 mg/dL   Nitrite, UA Negative Negative   Microscopic Examination See below:       Assessment & Plan:   Problem List Items Addressed This Visit    None    Visit Diagnoses    Laceration of right little finger without foreign body without damage to nail, subsequent encounter    -  Primary   Wound was irrigated with peroxide, dressed with neosporin and gauze, and re-splinted. Healing well w/o signs of infection. Recheck in about 5 days       Follow up plan: Return in about 5 days (around 04/14/2017) for Wound check.

## 2017-04-12 NOTE — Patient Instructions (Signed)
Follow up next week for wound check.

## 2017-04-14 ENCOUNTER — Ambulatory Visit: Payer: Medicaid Other | Admitting: Family Medicine

## 2017-04-15 ENCOUNTER — Ambulatory Visit: Payer: Medicaid Other | Admitting: Family Medicine

## 2017-04-15 ENCOUNTER — Other Ambulatory Visit: Payer: Self-pay | Admitting: Family Medicine

## 2017-04-15 ENCOUNTER — Encounter: Payer: Self-pay | Admitting: Family Medicine

## 2017-04-15 VITALS — BP 121/80 | HR 91 | Temp 97.6°F | Wt 204.0 lb

## 2017-04-15 DIAGNOSIS — Z4802 Encounter for removal of sutures: Secondary | ICD-10-CM

## 2017-04-15 DIAGNOSIS — J4540 Moderate persistent asthma, uncomplicated: Secondary | ICD-10-CM | POA: Diagnosis not present

## 2017-04-15 NOTE — Progress Notes (Signed)
BP 121/80 (BP Location: Right Arm, Patient Position: Sitting, Cuff Size: Normal)   Pulse 91   Temp 97.6 F (36.4 C) (Oral)   Wt 204 lb (92.5 kg)   LMP  (LMP Unknown)   SpO2 98%   BMI 38.55 kg/m    Subjective:    Patient ID: Nancy Stout, female    DOB: 1965-10-21, 52 y.o.   MRN: [id number]  HPI: Nancy Stout is a 52 y.o. female  Chief Complaint  Patient presents with  . Suture / Staple Removal  . Wound Check   Pt here today for suture removal and wound check. States wound has been doing fairly well, minimal pain, no discharge or redness to site. Has been keeping area clean, dry, and splinted to immobilize finger. Denies fevers, chills, sweats.   Also states she washed her albuterol inhaler and needs something else sent in until she can fill the next one.   Relevant past medical, surgical, family and social history reviewed and updated as indicated. Interim medical history since our last visit reviewed. Allergies and medications reviewed and updated.  Review of Systems  Constitutional: Negative.   Respiratory: Negative.   Cardiovascular: Negative.   Gastrointestinal: Negative.   Genitourinary: Negative.   Musculoskeletal: Negative.   Skin: Positive for wound.  Neurological: Negative.   Psychiatric/Behavioral: Negative.    Per HPI unless specifically indicated above     Objective:    BP 121/80 (BP Location: Right Arm, Patient Position: Sitting, Cuff Size: Normal)   Pulse 91   Temp 97.6 F (36.4 C) (Oral)   Wt 204 lb (92.5 kg)   LMP  (LMP Unknown)   SpO2 98%   BMI 38.55 kg/m   Wt Readings from Last 3 Encounters:  04/15/17 204 lb (92.5 kg)  04/09/17 210 lb (95.3 kg)  04/06/17 210 lb (95.3 kg)    Physical Exam  Constitutional: She is oriented to person, place, and time. She appears well-developed and well-nourished. No distress.  HENT:  Head: Atraumatic.  Eyes: Conjunctivae are normal. No scleral icterus.  Neck: Normal range of motion. Neck supple.   Cardiovascular: Normal rate and normal heart sounds.  Pulmonary/Chest: Effort normal. No respiratory distress.  Musculoskeletal: Normal range of motion.  Neurological: She is alert and oriented to person, place, and time.  Skin: Skin is warm and dry. No erythema.  Sutures intact at base of right 5th digit. Laceration healing well without redness, warmth, or drainage  Psychiatric: She has a normal mood and affect. Her behavior is normal.  Nursing note and vitals reviewed.  Procedure: Suture removal Area was unwrapped and sterilized with alcohol. Sutures removed with standard suture removal supplies without complication or pain. Wound edges approximated with dermabond and steri strips for added support given laceration is along a joint line. Area covered with gauze and splinted, then wrapped with paper tape. Wound care reviewed, as well as return precautions. Pt tolerated procedure well with no complications.       Assessment & Plan:   Problem List Items Addressed This Visit      Respiratory   Asthma    Will send ventolin inhaler as her proair was washed in the washer and she is unable to get another filled for a few weeks.        Other Visit Diagnoses    Visit for suture removal    -  Primary   Sutures removed today, wound reinforced with dermabond and steri strips. Keep splinted and immobilized. F/u in 3  days for wound check       Follow up plan: Return in about 5 days (around 04/20/2017) for wound check.

## 2017-04-17 ENCOUNTER — Telehealth: Payer: Self-pay | Admitting: Family Medicine

## 2017-04-17 MED ORDER — ALBUTEROL SULFATE HFA 108 (90 BASE) MCG/ACT IN AERS: 2.0000 | INHALATION_SPRAY | Freq: Four times a day (QID) | RESPIRATORY_TRACT | 0 refills | Status: DC | PRN

## 2017-04-17 NOTE — Telephone Encounter (Signed)
Copied from CRM [phone]. Topic: Quick Communication - See Telephone Encounter >> Apr 17, 2017 12:58 PM Terisa Starraylor, Brittany L wrote: CRM for notification. See Telephone encounter for:   04/17/17.  Patient said she seen Dr Laural BenesJohnson on Thursday & she was suppose to send her a script over for a inhaler. She said the pharmacy does not have it.  [id number] - [place], [place] - [address]

## 2017-04-17 NOTE — Telephone Encounter (Signed)
You saw her last 

## 2017-04-17 NOTE — Telephone Encounter (Signed)
Spoke with patient. Patient stated that she stated she washed and dried her Proair inhaler but insurance won't pay for it till the 25th, so she asked for samples which we didn't have. Patient stated you were going to send in Flovent, to see if that would be covered till she can get her Proair on the 25th.

## 2017-04-17 NOTE — Telephone Encounter (Signed)
That's right, we had discussed sending in ventolin to see if they would cover that as she had already filled the proair this month. I sent it over

## 2017-04-17 NOTE — Telephone Encounter (Signed)
I don't recall us discussing an inhaler refill, can you ask which one she needs? It looks like she should be good on refills on all three but I'm happy to send it over

## 2017-04-18 NOTE — Patient Instructions (Signed)
Follow up in 3 days

## 2017-04-18 NOTE — Assessment & Plan Note (Signed)
Will send ventolin inhaler as her proair was washed in the washer and she is unable to get another filled for a few weeks.

## 2017-04-20 ENCOUNTER — Ambulatory Visit: Payer: Medicaid Other | Admitting: Family Medicine

## 2017-04-21 ENCOUNTER — Encounter: Payer: Self-pay | Admitting: Family Medicine

## 2017-04-21 ENCOUNTER — Ambulatory Visit: Payer: Medicaid Other | Admitting: Family Medicine

## 2017-04-21 VITALS — BP 118/76 | HR 87 | Temp 98.7°F | Wt 205.7 lb

## 2017-04-21 DIAGNOSIS — R200 Anesthesia of skin: Secondary | ICD-10-CM | POA: Diagnosis not present

## 2017-04-21 DIAGNOSIS — S61214D Laceration without foreign body of right ring finger without damage to nail, subsequent encounter: Secondary | ICD-10-CM

## 2017-04-21 MED ORDER — GABAPENTIN 300 MG PO CAPS: 300.0000 mg | ORAL_CAPSULE | Freq: Three times a day (TID) | ORAL | 1 refills | Status: DC

## 2017-04-21 NOTE — Progress Notes (Signed)
BP 118/76   Pulse 87   Temp 98.7 F (37.1 C) (Oral)   Wt 205 lb 11.2 oz (93.3 kg)   LMP  (LMP Unknown)   SpO2 97%   BMI 38.87 kg/m    Subjective:    Patient ID: Nancy Stout, female    DOB: Aug 13, 1965, 52 y.o.   MRN: [id number]  HPI: Nancy Stout is a 52 y.o. female  Chief Complaint  Patient presents with  . Wound Check  . Numbness    pt states she has been having numbness and tinglingin her arms and hands for a while now mainly at night, but has started to have it during the day   Pt here today for wound check right 5th digit laceration. Continues to heal very well, pt removed splint and dressings 3 days ago and states area is without pain, drainage,bleeding, or redness.   Having ongoing numbness and tingling in b/l UEs for several months, used to only happen at night but now starting to happen during the day occasionally too. Has not tried much OTC for relief. Does lots of repetitive actions with her hands throughout the day.   Past Medical History:  Diagnosis Date  . Asthma   . COPD (chronic obstructive pulmonary disease) (HCC)   . GERD (gastroesophageal reflux disease)   . Hyperlipidemia   . Hypertension   . Low blood potassium    Due to Stomach Virus  . Low serum sodium    Due to stomach virus   Social History   Socioeconomic History  . Marital status: Single    Spouse name: Not on file  . Number of children: Not on file  . Years of education: Not on file  . Highest education level: Not on file  Social Needs  . Financial resource strain: Not on file  . Food insecurity - worry: Not on file  . Food insecurity - inability: Not on file  . Transportation needs - medical: Not on file  . Transportation needs - non-medical: Not on file  Occupational History  . Not on file  Tobacco Use  . Smoking status: Current Every Day Smoker    Packs/day: 0.25    Types: Cigarettes  . Smokeless tobacco: Never Used  Substance and Sexual Activity  . Alcohol use: Yes     Alcohol/week: 0.0 oz    Comment: one or less per day  . Drug use: No  . Sexual activity: Yes    Birth control/protection: None  Other Topics Concern  . Not on file  Social History Narrative  . Not on file   Relevant past medical, surgical, family and social history reviewed and updated as indicated. Interim medical history since our last visit reviewed. Allergies and medications reviewed and updated.  Review of Systems  Constitutional: Negative.   Respiratory: Negative.   Cardiovascular: Negative.   Gastrointestinal: Negative.   Musculoskeletal: Positive for arthralgias.  Skin: Positive for wound.  Neurological: Positive for numbness.  Hematological: Negative.   Psychiatric/Behavioral: Negative.    Per HPI unless specifically indicated above     Objective:    BP 118/76   Pulse 87   Temp 98.7 F (37.1 C) (Oral)   Wt 205 lb 11.2 oz (93.3 kg)   LMP  (LMP Unknown)   SpO2 97%   BMI 38.87 kg/m   Wt Readings from Last 3 Encounters:  04/21/17 205 lb 11.2 oz (93.3 kg)  04/15/17 204 lb (92.5 kg)  04/09/17 210 lb (95.3 kg)  Physical Exam  Constitutional: She is oriented to person, place, and time. She appears well-developed and well-nourished. No distress.  HENT:  Head: Atraumatic.  Eyes: Conjunctivae are normal. Pupils are equal, round, and reactive to light.  Neck: Neck supple.  Cardiovascular: Normal rate, normal heart sounds and intact distal pulses.  Pulmonary/Chest: Effort normal. No respiratory distress.  Musculoskeletal: Normal range of motion. She exhibits no edema, tenderness or deformity.  Strength equal B/L UEs   Neurological: She is alert and oriented to person, place, and time.  Skin: Skin is warm and dry.  Right 5th digit lac healing well, almost completely sealed with no evidence of infection   Psychiatric: She has a normal mood and affect. Her behavior is normal.  Nursing note and vitals reviewed.   Results for orders placed or performed in  visit on 06/12/16  Microscopic Examination  Result Value Ref Range   WBC, UA 0-5 0 - 5 /hpf   RBC, UA None seen 0 - 2 /hpf   Epithelial Cells (non renal) 0-10 0 - 10 /hpf   Bacteria, UA None seen None seen/Few  CBC with Differential/Platelet  Result Value Ref Range   WBC 7.5 3.4 - 10.8 x10E3/uL   RBC 4.82 3.77 - 5.28 x10E6/uL   Hemoglobin 15.9 11.1 - 15.9 g/dL   Hematocrit 09.845.0 11.934.0 - 46.6 %   MCV 93 79 - 97 fL   MCH 33.0 26.6 - 33.0 pg   MCHC 35.3 31.5 - 35.7 g/dL   RDW 14.714.1 82.912.3 - 56.215.4 %   Platelets 343 150 - 379 x10E3/uL   Neutrophils 82 Not Estab. %   Lymphs 13 Not Estab. %   Monocytes 4 Not Estab. %   Eos 0 Not Estab. %   Basos 1 Not Estab. %   Neutrophils Absolute 6.2 1.4 - 7.0 x10E3/uL   Lymphocytes Absolute 1.0 0.7 - 3.1 x10E3/uL   Monocytes Absolute 0.3 0.1 - 0.9 x10E3/uL   EOS (ABSOLUTE) 0.0 0.0 - 0.4 x10E3/uL   Basophils Absolute 0.0 0.0 - 0.2 x10E3/uL   Immature Granulocytes 0 Not Estab. %   Immature Grans (Abs) 0.0 0.0 - 0.1 x10E3/uL   Hematology Comments: Note:   Comprehensive metabolic panel  Result Value Ref Range   Glucose 122 (H) 65 - 99 mg/dL   BUN 23 6 - 24 mg/dL   Creatinine, Ser 1.300.69 0.57 - 1.00 mg/dL   GFR calc non Af Amer 102 >59 mL/min/1.73   GFR calc Af Amer 117 >59 mL/min/1.73   BUN/Creatinine Ratio 33 (H) 9 - 23   Sodium 139 134 - 144 mmol/L   Potassium 4.4 3.5 - 5.2 mmol/L   Chloride 96 96 - 106 mmol/L   CO2 25 18 - 29 mmol/L   Calcium 9.5 8.7 - 10.2 mg/dL   Total Protein 7.6 6.0 - 8.5 g/dL   Albumin 4.3 3.5 - 5.5 g/dL   Globulin, Total 3.3 1.5 - 4.5 g/dL   Albumin/Globulin Ratio 1.3 1.2 - 2.2   Bilirubin Total 0.4 0.0 - 1.2 mg/dL   Alkaline Phosphatase 119 (H) 39 - 117 IU/L   AST 22 0 - 40 IU/L   ALT 31 0 - 32 IU/L  Lipid Panel w/o Chol/HDL Ratio  Result Value Ref Range   Cholesterol, Total 199 100 - 199 mg/dL   Triglycerides 865120 0 - 149 mg/dL   HDL 49 >78>39 mg/dL   VLDL Cholesterol Cal 24 5 - 40 mg/dL   LDL Calculated 469126 (H) 0 -  99 mg/dL  Microalbumin, Urine Waived  Result Value Ref Range   Microalb, Ur Waived 30 (H) 0 - 19 mg/L   Creatinine, Urine Waived 300 10 - 300 mg/dL   Microalb/Creat Ratio <30 <30 mg/g  TSH  Result Value Ref Range   TSH 0.430 (L) 0.450 - 4.500 uIU/mL  UA/M w/rflx Culture, Routine  Result Value Ref Range   Specific Gravity, UA 1.020 1.005 - 1.030   pH, UA 5.5 5.0 - 7.5   Color, UA Yellow Yellow   Appearance Ur Clear Clear   Leukocytes, UA Negative Negative   Protein, UA Negative Negative/Trace   Glucose, UA Negative Negative   Ketones, UA Negative Negative   RBC, UA Negative Negative   Bilirubin, UA Negative Negative   Urobilinogen, Ur 0.2 0.2 - 1.0 mg/dL   Nitrite, UA Negative Negative   Microscopic Examination See below:       Assessment & Plan:   Problem List Items Addressed This Visit    None    Visit Diagnoses    Laceration of right ring finger without foreign body without damage to nail, subsequent encounter    -  Primary   Continues to heal well w/o complication. Will reinforce wound once more with dermabond, steri strips, and splint. Should be fully healed in the next week or so   Bilateral hand numbness       Suspect inflammatory neuropathic irritation. Wrist braces, massage, epsom soaks, stretching. Trial gabapentin and monitor closely for benefit. Risks reviewed       Follow up plan: Return in about 2 months (around 06/19/2017) for Paresthesias .

## 2017-04-23 NOTE — Patient Instructions (Signed)
Follow up in 2 months

## 2017-05-04 ENCOUNTER — Other Ambulatory Visit: Payer: Self-pay | Admitting: Family Medicine

## 2017-05-06 ENCOUNTER — Encounter: Payer: Self-pay | Admitting: Family Medicine

## 2017-05-06 ENCOUNTER — Ambulatory Visit (INDEPENDENT_AMBULATORY_CARE_PROVIDER_SITE_OTHER): Payer: Medicaid Other | Admitting: Family Medicine

## 2017-05-06 VITALS — BP 135/84 | HR 98 | Temp 98.0°F | Wt 208.6 lb

## 2017-05-06 DIAGNOSIS — R300 Dysuria: Secondary | ICD-10-CM

## 2017-05-06 DIAGNOSIS — W19XXXA Unspecified fall, initial encounter: Secondary | ICD-10-CM

## 2017-05-06 DIAGNOSIS — Z1211 Encounter for screening for malignant neoplasm of colon: Secondary | ICD-10-CM

## 2017-05-06 DIAGNOSIS — N393 Stress incontinence (female) (male): Secondary | ICD-10-CM | POA: Diagnosis not present

## 2017-05-06 DIAGNOSIS — N3001 Acute cystitis with hematuria: Secondary | ICD-10-CM

## 2017-05-06 DIAGNOSIS — Z1231 Encounter for screening mammogram for malignant neoplasm of breast: Secondary | ICD-10-CM

## 2017-05-06 DIAGNOSIS — Z1239 Encounter for other screening for malignant neoplasm of breast: Secondary | ICD-10-CM

## 2017-05-06 MED ORDER — NITROFURANTOIN MONOHYD MACRO 100 MG PO CAPS: 100.0000 mg | ORAL_CAPSULE | Freq: Two times a day (BID) | ORAL | 0 refills | Status: DC

## 2017-05-06 NOTE — Patient Instructions (Addendum)
[facility]  Address: [address], [place], [place] [address]  Phone: [phone]  Kegel Exercises Kegel exercises help strengthen the muscles that support the rectum, vagina, small intestine, bladder, and uterus. Doing Kegel exercises can help:  Improve bladder and bowel control.  Improve sexual response.  Reduce problems and discomfort during pregnancy.  Kegel exercises involve squeezing your pelvic floor muscles, which are the same muscles you squeeze when you try to stop the flow of urine. The exercises can be done while sitting, standing, or lying down, but it is best to vary your position. Phase 1 exercises 1. Squeeze your pelvic floor muscles tight. You should feel a tight lift in your rectal area. If you are a female, you should also feel a tightness in your vaginal area. Keep your stomach, buttocks, and legs relaxed. 2. Hold the muscles tight for up to 10 seconds. 3. Relax your muscles. Repeat this exercise 50 times a day or as many times as told by your health care provider. Continue to do this exercise for at least 4-6 weeks or for as long as told by your health care provider. This information is not intended to replace advice given to you by your health care provider. Make sure you discuss any questions you have with your health care provider. Document Released: 03/10/2012 Document Revised: 11/17/2015 Document Reviewed: 02/11/2015 Elsevier Interactive Patient Education  [employer]2018 [employer] Inc.

## 2017-05-06 NOTE — Progress Notes (Signed)
BP 135/84 (BP Location: Left Arm, Patient Position: Sitting, Cuff Size: Large)   Pulse 98   Temp 98 F (36.7 C)   Wt 208 lb 9 oz (94.6 kg)   LMP  (LMP Unknown)   SpO2 98%   BMI 39.41 kg/m    Subjective:    Patient ID: Nancy Stout, female    DOB: 1965/07/28, 52 y.o.   MRN: [id number]  HPI: Nancy Stout is a 52 y.o. female  Chief Complaint  Patient presents with  . Urinary Tract Infection  . Fall   URINARY SYMPTOMS Duration: 2 days Dysuria: yes Urinary frequency: yes Urgency: yes Small volume voids: yes Symptom severity: moderate Urinary incontinence: no Foul odor: no Hematuria: no Abdominal pain: no Back pain: no Suprapubic pain/pressure: no Flank pain: yes Fever:  no Vomiting: no Relief with cranberry juice: no Relief with pyridium: no Status: better/worse/stable Previous urinary tract infection: yes Recurrent urinary tract infection: no Sexual activity: monogomous History of sexually transmitted disease: no Vaginal discharge: no Treatments attempted: increasing fluids   Fall- fell in the parking lot on ice just before coming into the appointment. Doing well. No pain. Feels good.   Relevant past medical, surgical, family and social history reviewed and updated as indicated. Interim medical history since our last visit reviewed. Allergies and medications reviewed and updated.  Review of Systems  Constitutional: Negative.   Respiratory: Negative.   Cardiovascular: Negative.   Genitourinary: Positive for dysuria, flank pain, frequency and urgency. Negative for decreased urine volume, difficulty urinating, dyspareunia, enuresis, genital sores, hematuria, menstrual problem, pelvic pain, vaginal bleeding, vaginal discharge and vaginal pain.  Psychiatric/Behavioral: Negative.     Per HPI unless specifically indicated above     Objective:    BP 135/84 (BP Location: Left Arm, Patient Position: Sitting, Cuff Size: Large)   Pulse 98   Temp 98 F  (36.7 C)   Wt 208 lb 9 oz (94.6 kg)   LMP  (LMP Unknown)   SpO2 98%   BMI 39.41 kg/m   Wt Readings from Last 3 Encounters:  05/06/17 208 lb 9 oz (94.6 kg)  04/21/17 205 lb 11.2 oz (93.3 kg)  04/15/17 204 lb (92.5 kg)    Physical Exam  Constitutional: She is oriented to person, place, and time. She appears well-developed and well-nourished. No distress.  HENT:  Head: Normocephalic and atraumatic.  Right Ear: Hearing normal.  Left Ear: Hearing normal.  Nose: Nose normal.  Eyes: Conjunctivae and lids are normal. Right eye exhibits no discharge. Left eye exhibits no discharge. No scleral icterus.  Cardiovascular: Normal rate, regular rhythm, normal heart sounds and intact distal pulses. Exam reveals no gallop and no friction rub.  No murmur heard. Pulmonary/Chest: Effort normal and breath sounds normal. No respiratory distress. She has no wheezes. She has no rales. She exhibits no tenderness.  Abdominal: Soft. Bowel sounds are normal. She exhibits no distension and no mass. There is no tenderness. There is no rebound and no guarding.  Musculoskeletal: Normal range of motion. She exhibits no edema, tenderness or deformity.  Neurological: She is alert and oriented to person, place, and time.  Skin: Skin is warm, dry and intact. No rash noted. No erythema. No pallor.  Psychiatric: She has a normal mood and affect. Her speech is normal and behavior is normal. Judgment and thought content normal. Cognition and memory are normal.  Nursing note and vitals reviewed.   Results for orders placed or performed in visit on 06/12/16  Microscopic Examination  Result Value Ref Range   WBC, UA 0-5 0 - 5 /hpf   RBC, UA None seen 0 - 2 /hpf   Epithelial Cells (non renal) 0-10 0 - 10 /hpf   Bacteria, UA None seen None seen/Few  CBC with Differential/Platelet  Result Value Ref Range   WBC 7.5 3.4 - 10.8 x10E3/uL   RBC 4.82 3.77 - 5.28 x10E6/uL   Hemoglobin 15.9 11.1 - 15.9 g/dL   Hematocrit 14.7  82.9 - 46.6 %   MCV 93 79 - 97 fL   MCH 33.0 26.6 - 33.0 pg   MCHC 35.3 31.5 - 35.7 g/dL   RDW 56.2 13.0 - 86.5 %   Platelets 343 150 - 379 x10E3/uL   Neutrophils 82 Not Estab. %   Lymphs 13 Not Estab. %   Monocytes 4 Not Estab. %   Eos 0 Not Estab. %   Basos 1 Not Estab. %   Neutrophils Absolute 6.2 1.4 - 7.0 x10E3/uL   Lymphocytes Absolute 1.0 0.7 - 3.1 x10E3/uL   Monocytes Absolute 0.3 0.1 - 0.9 x10E3/uL   EOS (ABSOLUTE) 0.0 0.0 - 0.4 x10E3/uL   Basophils Absolute 0.0 0.0 - 0.2 x10E3/uL   Immature Granulocytes 0 Not Estab. %   Immature Grans (Abs) 0.0 0.0 - 0.1 x10E3/uL   Hematology Comments: Note:   Comprehensive metabolic panel  Result Value Ref Range   Glucose 122 (H) 65 - 99 mg/dL   BUN 23 6 - 24 mg/dL   Creatinine, Ser 7.84 0.57 - 1.00 mg/dL   GFR calc non Af Amer 102 >59 mL/min/1.73   GFR calc Af Amer 117 >59 mL/min/1.73   BUN/Creatinine Ratio 33 (H) 9 - 23   Sodium 139 134 - 144 mmol/L   Potassium 4.4 3.5 - 5.2 mmol/L   Chloride 96 96 - 106 mmol/L   CO2 25 18 - 29 mmol/L   Calcium 9.5 8.7 - 10.2 mg/dL   Total Protein 7.6 6.0 - 8.5 g/dL   Albumin 4.3 3.5 - 5.5 g/dL   Globulin, Total 3.3 1.5 - 4.5 g/dL   Albumin/Globulin Ratio 1.3 1.2 - 2.2   Bilirubin Total 0.4 0.0 - 1.2 mg/dL   Alkaline Phosphatase 119 (H) 39 - 117 IU/L   AST 22 0 - 40 IU/L   ALT 31 0 - 32 IU/L  Lipid Panel w/o Chol/HDL Ratio  Result Value Ref Range   Cholesterol, Total 199 100 - 199 mg/dL   Triglycerides 696 0 - 149 mg/dL   HDL 49 >29 mg/dL   VLDL Cholesterol Cal 24 5 - 40 mg/dL   LDL Calculated 528 (H) 0 - 99 mg/dL  Microalbumin, Urine Waived  Result Value Ref Range   Microalb, Ur Waived 30 (H) 0 - 19 mg/L   Creatinine, Urine Waived 300 10 - 300 mg/dL   Microalb/Creat Ratio <30 <30 mg/g  TSH  Result Value Ref Range   TSH 0.430 (L) 0.450 - 4.500 uIU/mL  UA/M w/rflx Culture, Routine  Result Value Ref Range   Specific Gravity, UA 1.020 1.005 - 1.030   pH, UA 5.5 5.0 - 7.5   Color,  UA Yellow Yellow   Appearance Ur Clear Clear   Leukocytes, UA Negative Negative   Protein, UA Negative Negative/Trace   Glucose, UA Negative Negative   Ketones, UA Negative Negative   RBC, UA Negative Negative   Bilirubin, UA Negative Negative   Urobilinogen, Ur 0.2 0.2 - 1.0 mg/dL   Nitrite, UA Negative Negative  Microscopic Examination See below:       Assessment & Plan:   Problem List Items Addressed This Visit    None    Visit Diagnoses    Acute cystitis with hematuria    -  Primary   Will treat with nitrofurantoin. Call with any concerns or if not getting better.   Dysuria       +UTI- will treat   Relevant Orders   UA/M w/rflx Culture, Routine   Screening for breast cancer       Mammogram ordered today.   Screening for colon cancer       Colonoscopy ordered today.   Relevant Orders   Ambulatory referral to Gastroenterology   Stress incontinence       Discussed kegals and timed voiding. Call with any concerns. Continue to monitor.    Relevant Medications   nitrofurantoin, macrocrystal-monohydrate, (MACROBID) 100 MG capsule   Fall, initial encounter       Normal exam. Ibuprofen as needed. Call if not getting better or getting worse.       Follow up plan: Return if symptoms worsen or fail to improve.

## 2017-05-10 LAB — MICROSCOPIC EXAMINATION
RBC, UA: >30 /hpf — AB (ref 0–?)
WBC, UA: >30 /hpf — AB (ref 0–?)

## 2017-05-10 LAB — UA/M W/RFLX CULTURE, ROUTINE
BILIRUBIN UA: NEGATIVE
GLUCOSE, UA: NEGATIVE
KETONES UA: NEGATIVE
Nitrite, UA: POSITIVE — AB
Specific Gravity, UA: 1.02 (ref 1.005–1.030)
UUROB: 0.2 mg/dL (ref 0.2–1.0)
pH, UA: 5.5 (ref 5.0–7.5)

## 2017-05-10 LAB — URINE CULTURE, REFLEX

## 2017-05-25 ENCOUNTER — Encounter: Payer: Medicaid Other | Admitting: Family Medicine

## 2017-06-19 ENCOUNTER — Ambulatory Visit: Payer: Medicaid Other | Admitting: Family Medicine

## 2017-06-19 ENCOUNTER — Other Ambulatory Visit: Payer: Self-pay | Admitting: Family Medicine

## 2017-06-19 ENCOUNTER — Encounter: Payer: Self-pay | Admitting: Family Medicine

## 2017-06-19 VITALS — BP 116/78 | HR 90 | Temp 98.2°F | Wt 208.1 lb

## 2017-06-19 DIAGNOSIS — M2550 Pain in unspecified joint: Secondary | ICD-10-CM | POA: Diagnosis not present

## 2017-06-19 DIAGNOSIS — R202 Paresthesia of skin: Secondary | ICD-10-CM | POA: Diagnosis not present

## 2017-06-19 LAB — BAYER DCA HB A1C WAIVED: HB A1C: 5.1 % (ref <7.0)

## 2017-06-19 MED ORDER — GABAPENTIN 400 MG PO CAPS: ORAL_CAPSULE | ORAL | 3 refills | Status: DC

## 2017-06-19 NOTE — Progress Notes (Signed)
BP 116/78   Pulse 90   Temp 98.2 F (36.8 C) (Oral)   Wt 208 lb 1.6 oz (94.4 kg)   LMP  (LMP Unknown)   SpO2 98%   BMI 39.32 kg/m    Subjective:    Patient ID: Nancy Stout, female    DOB: [birth year], 52 y.o.   MRN: [id number]  HPI: Nancy Stout is a 52 y.o. female  Chief Complaint  Patient presents with  . Numbness   NUMBNESS- had numbness on her L side, has been started on gabapentin, and it's much better. Seems to help. Now happening occasionally. Still not quite there, but a lot better. Duration: chronic Onset: gradual Location: L side and L arm Bilateral: no Symmetric: no Decreased sensation: no  Weakness: no Pain: no Quality:  Numbness and tingling Severity: mild  Frequency: intermittent Trauma: no Recent illness: no Diabetes: no Thyroid disease: no  HIV: no  Alcoholism: no  Spinal cord injury: no Status: better  ARTHRALGIAS / JOINT ACHES Duration: weeks Pain: yes Symmetric: yes Severity: moderate Quality: aching Frequency: constant Context:  worse Decreased function/range of motion: yes  Erythema: no Swelling: yes Heat or warmth: yes Morning stiffness: yes Relief with NSAIDs?: mild Treatments attempted:  rest, ice, heat, APAP, ibuprofen and aleve  Involved Joints:     Hands: yes bilateral    Wrists: yes bilateral     Elbows: yes bilateral    Shoulders: no     Back: yes     Hips: no     Knees: yes bilateral    Ankles: no     Feet: no    Relevant past medical, surgical, family and social history reviewed and updated as indicated. Interim medical history since our last visit reviewed. Allergies and medications reviewed and updated.  Review of Systems  Constitutional: Negative.   Respiratory: Negative.   Cardiovascular: Negative.   Musculoskeletal: Positive for arthralgias and joint swelling. Negative for back pain, gait problem, myalgias, neck pain and neck stiffness.  Skin: Negative.   Neurological: Positive for numbness.  Negative for dizziness, tremors, seizures, syncope, facial asymmetry, speech difficulty, weakness, light-headedness and headaches.  Psychiatric/Behavioral: Negative.     Per HPI unless specifically indicated above     Objective:    BP 116/78   Pulse 90   Temp 98.2 F (36.8 C) (Oral)   Wt 208 lb 1.6 oz (94.4 kg)   LMP  (LMP Unknown)   SpO2 98%   BMI 39.32 kg/m   Wt Readings from Last 3 Encounters:  06/19/17 208 lb 1.6 oz (94.4 kg)  05/06/17 208 lb 9 oz (94.6 kg)  04/21/17 205 lb 11.2 oz (93.3 kg)    Physical Exam  Constitutional: She is oriented to person, place, and time. She appears well-developed and well-nourished. No distress.  HENT:  Head: Normocephalic and atraumatic.  Right Ear: Hearing normal.  Left Ear: Hearing normal.  Nose: Nose normal.  Eyes: Conjunctivae and lids are normal. Right eye exhibits no discharge. Left eye exhibits no discharge. No scleral icterus.  Cardiovascular: Normal rate, regular rhythm, normal heart sounds and intact distal pulses. Exam reveals no gallop and no friction rub.  No murmur heard. Pulmonary/Chest: Effort normal and breath sounds normal. No respiratory distress. She has no wheezes. She has no rales. She exhibits no tenderness.  Musculoskeletal: Normal range of motion. She exhibits no edema, tenderness or deformity.  Neurological: She is alert and oriented to person, place, and time.  Skin: Skin is warm, dry and  intact. No rash noted. She is not diaphoretic. No erythema. No pallor.  Psychiatric: She has a normal mood and affect. Her speech is normal and behavior is normal. Judgment and thought content normal. Cognition and memory are normal.  Nursing note and vitals reviewed.   Results for orders placed or performed in visit on 06/19/17  Bayer DCA Hb A1c Waived  Result Value Ref Range   Bayer DCA Hb A1c Waived 5.1 <7.0 %      Assessment & Plan:   Problem List Items Addressed This Visit    None    Visit Diagnoses     Paresthesias    -  Primary   Doing much better. Will increase to 474m TID and recheck 1 month. Checking labs to look for other causes. Call with any concerns.    Relevant Orders   TSH   Comprehensive metabolic panel   CBC with Differential/Platelet   Bayer DCA Hb A1c Waived (Completed)   VITAMIN D 25 Hydroxy (Vit-D Deficiency, Fractures)   Lyme Ab/Western Blot Reflex   Rocky mtn spotted fvr abs pnl(IgG+IgM)   Ehrlichia Antibody Panel   Babesia microti Antibody Panel   Arthralgia, unspecified joint       Likely arthrtitis. Has history of RA in her aunt. Has never been checked- would like to be. Labs drawn today. Await results. Call with any concerns.    Relevant Orders   VITAMIN D 25 Hydroxy (Vit-D Deficiency, Fractures)   Lyme Ab/Western Blot Reflex   Rocky mtn spotted fvr abs pnl(IgG+IgM)   Ehrlichia Antibody Panel   Babesia microti Antibody Panel   Antinuclear Antib (ANA)   Sed Rate (ESR)   CK (Creatine Kinase)   RA Qn+CCP(IgG/A)+SjoSSA+SjoSSB       Follow up plan: Return in about 6 weeks (around 07/31/2017).

## 2017-06-21 ENCOUNTER — Other Ambulatory Visit: Payer: Self-pay | Admitting: Family Medicine

## 2017-06-23 LAB — COMPREHENSIVE METABOLIC PANEL
ALK PHOS: 117 IU/L (ref 39–117)
ALT: 14 IU/L (ref 0–32)
AST: 11 IU/L (ref 0–40)
Albumin/Globulin Ratio: 1.9 (ref 1.2–2.2)
Albumin: 4.5 g/dL (ref 3.5–5.5)
BILIRUBIN TOTAL: 0.3 mg/dL (ref 0.0–1.2)
BUN/Creatinine Ratio: 34 — ABNORMAL HIGH (ref 9–23)
BUN: 22 mg/dL (ref 6–24)
CHLORIDE: 99 mmol/L (ref 96–106)
CO2: 23 mmol/L (ref 20–29)
Calcium: 9.3 mg/dL (ref 8.7–10.2)
Creatinine, Ser: 0.65 mg/dL (ref 0.57–1.00)
GFR calc non Af Amer: 103 mL/min/{1.73_m2} (ref >59)
GFR, EST AFRICAN AMERICAN: 119 mL/min/{1.73_m2} (ref >59)
Globulin, Total: 2.4 g/dL (ref 1.5–4.5)
Glucose: 100 mg/dL — ABNORMAL HIGH (ref 65–99)
Potassium: 4.3 mmol/L (ref 3.5–5.2)
Sodium: 136 mmol/L (ref 134–144)
TOTAL PROTEIN: 6.9 g/dL (ref 6.0–8.5)

## 2017-06-23 LAB — CBC WITH DIFFERENTIAL/PLATELET
BASOS ABS: 0 10*3/uL (ref 0.0–0.2)
Basos: 0 %
EOS (ABSOLUTE): 0.3 10*3/uL (ref 0.0–0.4)
Eos: 4 %
Hematocrit: 44.8 % (ref 34.0–46.6)
Hemoglobin: 15.4 g/dL (ref 11.1–15.9)
IMMATURE GRANS (ABS): 0 10*3/uL (ref 0.0–0.1)
IMMATURE GRANULOCYTES: 0 %
LYMPHS: 20 %
Lymphocytes Absolute: 1.8 10*3/uL (ref 0.7–3.1)
MCH: 33 pg (ref 26.6–33.0)
MCHC: 34.4 g/dL (ref 31.5–35.7)
MCV: 96 fL (ref 79–97)
Monocytes Absolute: 0.5 10*3/uL (ref 0.1–0.9)
Monocytes: 6 %
NEUTROS ABS: 6.4 10*3/uL (ref 1.4–7.0)
NEUTROS PCT: 70 %
PLATELETS: 270 10*3/uL (ref 150–379)
RBC: 4.67 x10E6/uL (ref 3.77–5.28)
RDW: 14.5 % (ref 12.3–15.4)
WBC: 9 10*3/uL (ref 3.4–10.8)

## 2017-06-23 LAB — VITAMIN D 25 HYDROXY (VIT D DEFICIENCY, FRACTURES): VIT D 25 HYDROXY: 13.4 ng/mL — AB (ref 30.0–100.0)

## 2017-06-23 LAB — BABESIA MICROTI ANTIBODY PANEL
Babesia microti IgG: <1:10 {titer}
Babesia microti IgM: <1:10 {titer}

## 2017-06-23 LAB — LYME AB/WESTERN BLOT REFLEX
LYME DISEASE AB, QUANT, IGM: <0.8 index (ref 0.00–0.79)
Lyme IgG/IgM Ab: <0.91 {ISR} (ref 0.00–0.90)

## 2017-06-23 LAB — RA QN+CCP(IGG/A)+SJOSSA+SJOSSB
CYCLIC CITRULLIN PEPTIDE AB: 2 U (ref 0–19)
ENA SSA (RO) Ab: <0.2 AI (ref 0.0–0.9)
Rhuematoid fact SerPl-aCnc: 13.8 IU/mL (ref 0.0–13.9)

## 2017-06-23 LAB — CK: CK TOTAL: 52 U/L (ref 24–173)

## 2017-06-23 LAB — ROCKY MTN SPOTTED FVR ABS PNL(IGG+IGM)
RMSF IgG: UNDETERMINED
RMSF IgM: 0.41 index (ref 0.00–0.89)

## 2017-06-23 LAB — RMSF, IGG, IFA

## 2017-06-23 LAB — EHRLICHIA ANTIBODY PANEL
E. Chaffeensis (HME) IgM Titer: NEGATIVE
HGE IGM TITER: NEGATIVE
HGE IgG Titer: NEGATIVE

## 2017-06-23 LAB — ANA: Anti Nuclear Antibody(ANA): POSITIVE — AB

## 2017-06-23 LAB — TSH: TSH: 1.24 u[IU]/mL (ref 0.450–4.500)

## 2017-06-23 LAB — SEDIMENTATION RATE: SED RATE: 25 mm/h (ref 0–40)

## 2017-07-01 ENCOUNTER — Telehealth: Payer: Self-pay

## 2017-07-01 MED ORDER — DOXYCYCLINE HYCLATE 100 MG PO TABS: 100.0000 mg | ORAL_TABLET | Freq: Two times a day (BID) | ORAL | 0 refills | Status: DC

## 2017-07-01 NOTE — Telephone Encounter (Signed)
Called pt to discuss lab results - first that she had 2 tick titers come back positive, indicating either past or present infection and given her current sxs will start her immediately on doxycycline. Also discussed mildly low TSH, which can be rechecked at upcoming OV. Vit D was low, recommended OTC supplementation and increased dietary intake.  Discussed at length to call back if sxs are not improving or are worsening.   Will leave this in Dr. Henriette CombsJohnson's box for review and any further recommendations she may have.

## 2017-07-01 NOTE — Telephone Encounter (Signed)
Copied from CRM [phone]#[id number]. Topic: Medical Record Request - Patient ROI Request >> Jun 26, 2017 11:53 AM Trula SladeWalter, Linda F wrote: Patient Name/DOB/MRN #:   Nancy Stout / Apr 08, 2065 / [id number] Requestor Name/Agency:   Osvaldo ShipperIyo White w/[facility] Call Back #:   [phone] Information Requested:   Office Notes, Labs and Demographics related to her 06/19/17   Route to [facility] Pool for [facility]. For all other clinics, route to the clinic's PEC Pool.  >> Jun 29, 2017 10:48 AM Raquel SarnaHayes, Teresa G wrote: Pt calling back to find out results from her labs done on 06-19-17. Please call pt back asap to let her know something.   Routing to provider for lab results from 06/19/17 visit.

## 2017-07-11 NOTE — Telephone Encounter (Signed)
Agree with the below. Can we double check that the reportable disease form was sent to the health department? If not we will need to fill it out and send it. Thanks!

## 2017-07-13 NOTE — Telephone Encounter (Signed)
Formed filled out by Dr.Johnson, will fax it to [facility].

## 2017-07-28 ENCOUNTER — Other Ambulatory Visit: Payer: Self-pay | Admitting: Family Medicine

## 2017-08-03 ENCOUNTER — Ambulatory Visit
Admission: RE | Admit: 2017-08-03 | Discharge: 2017-08-03 | Disposition: A | Discharge status: Home / Self Care | Payer: Medicaid Other | Source: Ambulatory Visit | Attending: Family Medicine | Admitting: Family Medicine

## 2017-08-03 ENCOUNTER — Ambulatory Visit: Payer: Medicaid Other | Admitting: Family Medicine

## 2017-08-03 ENCOUNTER — Encounter: Payer: Self-pay | Admitting: Family Medicine

## 2017-08-03 ENCOUNTER — Telehealth: Payer: Self-pay | Admitting: Family Medicine

## 2017-08-03 VITALS — BP 148/83 | HR 92 | Temp 98.7°F | Ht 60.0 in | Wt 206.2 lb

## 2017-08-03 DIAGNOSIS — E782 Mixed hyperlipidemia: Secondary | ICD-10-CM

## 2017-08-03 DIAGNOSIS — M150 Primary generalized (osteo)arthritis: Principal | ICD-10-CM

## 2017-08-03 DIAGNOSIS — R21 Rash and other nonspecific skin eruption: Secondary | ICD-10-CM | POA: Diagnosis not present

## 2017-08-03 DIAGNOSIS — Z1231 Encounter for screening mammogram for malignant neoplasm of breast: Secondary | ICD-10-CM

## 2017-08-03 DIAGNOSIS — M1990 Unspecified osteoarthritis, unspecified site: Secondary | ICD-10-CM | POA: Insufficient documentation

## 2017-08-03 DIAGNOSIS — N926 Irregular menstruation, unspecified: Secondary | ICD-10-CM | POA: Diagnosis not present

## 2017-08-03 DIAGNOSIS — M159 Polyosteoarthritis, unspecified: Secondary | ICD-10-CM

## 2017-08-03 DIAGNOSIS — M25561 Pain in right knee: Secondary | ICD-10-CM | POA: Insufficient documentation

## 2017-08-03 DIAGNOSIS — M79642 Pain in left hand: Secondary | ICD-10-CM | POA: Insufficient documentation

## 2017-08-03 DIAGNOSIS — M25562 Pain in left knee: Secondary | ICD-10-CM | POA: Insufficient documentation

## 2017-08-03 DIAGNOSIS — Z0000 Encounter for general adult medical examination without abnormal findings: Secondary | ICD-10-CM | POA: Diagnosis not present

## 2017-08-03 DIAGNOSIS — Z720 Tobacco use: Secondary | ICD-10-CM | POA: Diagnosis not present

## 2017-08-03 DIAGNOSIS — I129 Hypertensive chronic kidney disease with stage 1 through stage 4 chronic kidney disease, or unspecified chronic kidney disease: Secondary | ICD-10-CM

## 2017-08-03 DIAGNOSIS — M79641 Pain in right hand: Secondary | ICD-10-CM | POA: Insufficient documentation

## 2017-08-03 DIAGNOSIS — M170 Bilateral primary osteoarthritis of knee: Secondary | ICD-10-CM | POA: Insufficient documentation

## 2017-08-03 DIAGNOSIS — J4540 Moderate persistent asthma, uncomplicated: Secondary | ICD-10-CM | POA: Diagnosis not present

## 2017-08-03 DIAGNOSIS — Z1211 Encounter for screening for malignant neoplasm of colon: Secondary | ICD-10-CM | POA: Diagnosis not present

## 2017-08-03 DIAGNOSIS — D751 Secondary polycythemia: Secondary | ICD-10-CM | POA: Diagnosis not present

## 2017-08-03 LAB — UA/M W/RFLX CULTURE, ROUTINE
BILIRUBIN UA: NEGATIVE
GLUCOSE, UA: NEGATIVE
Ketones, UA: NEGATIVE
Leukocytes, UA: NEGATIVE
Nitrite, UA: NEGATIVE
PH UA: 5.5 (ref 5.0–7.5)
PROTEIN UA: NEGATIVE
RBC UA: NEGATIVE
SPEC GRAV UA: 1.02 (ref 1.005–1.030)
UUROB: 0.2 mg/dL (ref 0.2–1.0)

## 2017-08-03 LAB — MICROALBUMIN, URINE WAIVED
Creatinine, Urine Waived: 200 mg/dL (ref 10–300)
Microalb, Ur Waived: 30 mg/L — ABNORMAL HIGH (ref 0–19)

## 2017-08-03 MED ORDER — LISINOPRIL 20 MG PO TABS: 20.0000 mg | ORAL_TABLET | Freq: Every day | ORAL | 1 refills | Status: DC

## 2017-08-03 MED ORDER — ALBUTEROL SULFATE HFA 108 (90 BASE) MCG/ACT IN AERS: 2.0000 | INHALATION_SPRAY | RESPIRATORY_TRACT | 6 refills | Status: DC | PRN

## 2017-08-03 MED ORDER — GABAPENTIN 400 MG PO CAPS: ORAL_CAPSULE | ORAL | 3 refills | Status: DC

## 2017-08-03 MED ORDER — NAPROXEN 500 MG PO TABS: 500.0000 mg | ORAL_TABLET | Freq: Two times a day (BID) | ORAL | 6 refills | Status: DC

## 2017-08-03 MED ORDER — OMEPRAZOLE 20 MG PO CPDR: 20.0000 mg | DELAYED_RELEASE_CAPSULE | Freq: Every day | ORAL | 3 refills | Status: DC

## 2017-08-03 MED ORDER — PRAVASTATIN SODIUM 40 MG PO TABS: ORAL_TABLET | ORAL | 1 refills | Status: DC

## 2017-08-03 MED ORDER — FLUTICASONE PROPIONATE 50 MCG/ACT NA SUSP: 2.0000 | Freq: Every day | NASAL | 6 refills | Status: DC

## 2017-08-03 MED ORDER — FLUTICASONE-SALMETEROL 250-50 MCG/DOSE IN AEPB: INHALATION_SPRAY | RESPIRATORY_TRACT | 6 refills | Status: DC

## 2017-08-03 MED ORDER — MONTELUKAST SODIUM 10 MG PO TABS: 10.0000 mg | ORAL_TABLET | Freq: Every day | ORAL | 3 refills | Status: DC

## 2017-08-03 MED ORDER — ALBUTEROL SULFATE (2.5 MG/3ML) 0.083% IN NEBU: INHALATION_SOLUTION | RESPIRATORY_TRACT | 6 refills | Status: DC

## 2017-08-03 NOTE — Telephone Encounter (Signed)
Patient notified

## 2017-08-03 NOTE — Patient Instructions (Addendum)
[facility] at [facility]  Address: [address] [place], [place], [place] [address]  Phone: [phone]  Health Maintenance, Female Adopting a healthy lifestyle and getting preventive care can go a long way to promote health and wellness. Talk with your health care provider about what schedule of regular examinations is right for you. This is a good chance for you to check in with your provider about disease prevention and staying healthy. In between checkups, there are plenty of things you can do on your own. Experts have done a lot of research about which lifestyle changes and preventive measures are most likely to keep you healthy. Ask your health care provider for more information. Weight and diet Eat a healthy diet  Be sure to include plenty of vegetables, fruits, low-fat dairy products, and lean protein.  Do not eat a lot of foods high in solid fats, added sugars, or salt.  Get regular exercise. This is one of the most important things you can do for your health. ? Most adults should exercise for at least 150 minutes each week. The exercise should increase your heart rate and make you sweat (moderate-intensity exercise). ? Most adults should also do strengthening exercises at least twice a week. This is in addition to the moderate-intensity exercise.  Maintain a healthy weight  Body mass index (BMI) is a measurement that can be used to identify possible weight problems. It estimates body fat based on height and weight. Your health care provider can help determine your BMI and help you achieve or maintain a healthy weight.  For females 75 years of age and older: ? A BMI below 18.5 is considered underweight. ? A BMI of 18.5 to 24.9 is normal. ? A BMI of 25 to 29.9 is considered overweight. ? A BMI of 30 and above is considered obese.  Watch levels of cholesterol and blood lipids  You should start having your blood tested for lipids and cholesterol at 52 years of  age, then have this test every 5 years.  You may need to have your cholesterol levels checked more often if: ? Your lipid or cholesterol levels are high. ? You are older than 51 years of age. ? You are at high risk for heart disease.  Cancer screening Lung Cancer  Lung cancer screening is recommended for adults 52-5 years old who are at high risk for lung cancer because of a history of smoking.  A yearly low-dose CT scan of the lungs is recommended for people who: ? Currently smoke. ? Have quit within the past 15 years. ? Have at least a 30-pack-year history of smoking. A pack year is smoking an average of one pack of cigarettes a day for 1 year.  Yearly screening should continue until it has been 15 years since you quit.  Yearly screening should stop if you develop a health problem that would prevent you from having lung cancer treatment.  Breast Cancer  Practice breast self-awareness. This means understanding how your breasts normally appear and feel.  It also means doing regular breast self-exams. Let your health care provider know about any changes, no matter how small.  If you are in your 20s or 30s, you should have a clinical breast exam (CBE) by a health care provider every 1-3 years as part of a regular health exam.  If you are 9 or older, have a CBE every year. Also consider having a breast X-ray (mammogram) every year.  If you have a  family history of breast cancer, talk to your health care provider about genetic screening.  If you are at high risk for breast cancer, talk to your health care provider about having an MRI and a mammogram every year.  Breast cancer gene (BRCA) assessment is recommended for women who have family members with BRCA-related cancers. BRCA-related cancers include: ? Breast. ? Ovarian. ? Tubal. ? Peritoneal cancers.  Results of the assessment will determine the need for genetic counseling and BRCA1 and BRCA2 testing.  Cervical  Cancer Your health care provider may recommend that you be screened regularly for cancer of the pelvic organs (ovaries, uterus, and vagina). This screening involves a pelvic examination, including checking for microscopic changes to the surface of your cervix (Pap test). You may be encouraged to have this screening done every 3 years, beginning at age 21.  For women ages 30-65, health care providers may recommend pelvic exams and Pap testing every 3 years, or they may recommend the Pap and pelvic exam, combined with testing for human papilloma virus (HPV), every 5 years. Some types of HPV increase your risk of cervical cancer. Testing for HPV may also be done on women of any age with unclear Pap test results.  Other health care providers may not recommend any screening for nonpregnant women who are considered low risk for pelvic cancer and who do not have symptoms. Ask your health care provider if a screening pelvic exam is right for you.  If you have had past treatment for cervical cancer or a condition that could lead to cancer, you need Pap tests and screening for cancer for at least 20 years after your treatment. If Pap tests have been discontinued, your risk factors (such as having a new sexual partner) need to be reassessed to determine if screening should resume. Some women have medical problems that increase the chance of getting cervical cancer. In these cases, your health care provider may recommend more frequent screening and Pap tests.  Colorectal Cancer  This type of cancer can be detected and often prevented.  Routine colorectal cancer screening usually begins at 52 years of age and continues through 52 years of age.  Your health care provider may recommend screening at an earlier age if you have risk factors for colon cancer.  Your health care provider may also recommend using home test kits to check for hidden blood in the stool.  A small camera at the end of a tube can be used to  examine your colon directly (sigmoidoscopy or colonoscopy). This is done to check for the earliest forms of colorectal cancer.  Routine screening usually begins at age 50.  Direct examination of the colon should be repeated every 5-10 years through 52 years of age. However, you may need to be screened more often if early forms of precancerous polyps or small growths are found.  Skin Cancer  Check your skin from head to toe regularly.  Tell your health care provider about any new moles or changes in moles, especially if there is a change in a mole's shape or color.  Also tell your health care provider if you have a mole that is larger than the size of a pencil eraser.  Always use sunscreen. Apply sunscreen liberally and repeatedly throughout the day.  Protect yourself by wearing long sleeves, pants, a wide-brimmed hat, and sunglasses whenever you are outside.  Heart disease, diabetes, and high blood pressure  High blood pressure causes heart disease and increases the risk of   stroke. High blood pressure is more likely to develop in: ? People who have blood pressure in the high end of the normal range (130-139/85-89 mm Hg). ? People who are overweight or obese. ? People who are African American.  If you are 18-39 years of age, have your blood pressure checked every 3-5 years. If you are 40 years of age or older, have your blood pressure checked every year. You should have your blood pressure measured twice-once when you are at a hospital or clinic, and once when you are not at a hospital or clinic. Record the average of the two measurements. To check your blood pressure when you are not at a hospital or clinic, you can use: ? An automated blood pressure machine at a pharmacy. ? A home blood pressure monitor.  If you are between 55 years and 79 years old, ask your health care provider if you should take aspirin to prevent strokes.  Have regular diabetes screenings. This involves taking a  blood sample to check your fasting blood sugar level. ? If you are at a normal weight and have a low risk for diabetes, have this test once every three years after 52 years of age. ? If you are overweight and have a high risk for diabetes, consider being tested at a younger age or more often. Preventing infection Hepatitis B  If you have a higher risk for hepatitis B, you should be screened for this virus. You are considered at high risk for hepatitis B if: ? You were born in a country where hepatitis B is common. Ask your health care provider which countries are considered high risk. ? Your parents were born in a high-risk country, and you have not been immunized against hepatitis B (hepatitis B vaccine). ? You have HIV or AIDS. ? You use needles to inject street drugs. ? You live with someone who has hepatitis B. ? You have had sex with someone who has hepatitis B. ? You get hemodialysis treatment. ? You take certain medicines for conditions, including cancer, organ transplantation, and autoimmune conditions.  Hepatitis C  Blood testing is recommended for: ? Everyone born from 1945 through 1965. ? Anyone with known risk factors for hepatitis C.  Sexually transmitted infections (STIs)  You should be screened for sexually transmitted infections (STIs) including gonorrhea and chlamydia if: ? You are sexually active and are younger than 52 years of age. ? You are older than 52 years of age and your health care provider tells you that you are at risk for this type of infection. ? Your sexual activity has changed since you were last screened and you are at an increased risk for chlamydia or gonorrhea. Ask your health care provider if you are at risk.  If you do not have HIV, but are at risk, it may be recommended that you take a prescription medicine daily to prevent HIV infection. This is called pre-exposure prophylaxis (PrEP). You are considered at risk if: ? You are sexually active and  do not regularly use condoms or know the HIV status of your partner(s). ? You take drugs by injection. ? You are sexually active with a partner who has HIV.  Talk with your health care provider about whether you are at high risk of being infected with HIV. If you choose to begin PrEP, you should first be tested for HIV. You should then be tested every 3 months for as long as you are taking PrEP. Pregnancy  If   you are premenopausal and you may become pregnant, ask your health care provider about preconception counseling.  If you may become pregnant, take 400 to 800 micrograms (mcg) of folic acid every day.  If you want to prevent pregnancy, talk to your health care provider about birth control (contraception). Osteoporosis and menopause  Osteoporosis is a disease in which the bones lose minerals and strength with aging. This can result in serious bone fractures. Your risk for osteoporosis can be identified using a bone density scan.  If you are 23 years of age or older, or if you are at risk for osteoporosis and fractures, ask your health care provider if you should be screened.  Ask your health care provider whether you should take a calcium or vitamin D supplement to lower your risk for osteoporosis.  Menopause may have certain physical symptoms and risks.  Hormone replacement therapy may reduce some of these symptoms and risks. Talk to your health care provider about whether hormone replacement therapy is right for you. Follow these instructions at home:  Schedule regular health, dental, and eye exams.  Stay current with your immunizations.  Do not use any tobacco products including cigarettes, chewing tobacco, or electronic cigarettes.  If you are pregnant, do not drink alcohol.  If you are breastfeeding, limit how much and how often you drink alcohol.  Limit alcohol intake to no more than 1 drink per day for nonpregnant women. One drink equals 12 ounces of beer, 5 ounces of  wine, or 1 ounces of hard liquor.  Do not use street drugs.  Do not share needles.  Ask your health care provider for help if you need support or information about quitting drugs.  Tell your health care provider if you often feel depressed.  Tell your health care provider if you have ever been abused or do not feel safe at home. This information is not intended to replace advice given to you by your health care provider. Make sure you discuss any questions you have with your health care provider. Document Released: 10/07/2010 Document Revised: 08/30/2015 Document Reviewed: 12/26/2014 Elsevier Interactive Patient Education  [employer].

## 2017-08-03 NOTE — Assessment & Plan Note (Signed)
Not under good control. Triggered by allergies. Will start singulair and recheck 1 month.

## 2017-08-03 NOTE — Assessment & Plan Note (Signed)
Will increase her lisinopril to  and stop her HCTZ. Call with any concerns. Refills given.

## 2017-08-03 NOTE — Assessment & Plan Note (Signed)
Likely. Will check x-rays. Await results. Call with any concerns.

## 2017-08-03 NOTE — Assessment & Plan Note (Signed)
Rechecking levels today. Await results.  

## 2017-08-03 NOTE — Assessment & Plan Note (Signed)
Rechecking levels today. Continue pravastatin. Call with any concerns. Refills given today.

## 2017-08-03 NOTE — Progress Notes (Signed)
BP (!) 148/83 (BP Location: Left Arm, Patient Position: Sitting, Cuff Size: Normal)   Pulse 92   Temp 98.7 F (37.1 C)   Ht 5' (1.524 m)   Wt 206 lb 3 oz (93.5 kg)   LMP 08/02/2017 (Exact Date)   SpO2 97%   BMI 40.27 kg/m    Subjective:    Patient ID: Nancy Stout, female    DOB: Mar 18, 1966, 52 y.o.   MRN: [id number]  HPI: Nancy Stout is a 52 y.o. female presenting on 08/03/2017 for comprehensive medical examination. Current medical complaints include:  Pain is a lot better down to a 7/10 from a 10/10- pain in her thumbs and her knees Has been having spotting starting last night unclear if she has not had a period in over a year or only 6 months.   HYPERTENSION / HYPERLIPIDEMIA Satisfied with current treatment? yes Duration of hypertension: chronic BP monitoring frequency: not checking BP medication side effects: no Past BP meds:  Lisinopril-HCTZ Duration of hyperlipidemia: chronic Cholesterol medication side effects: no Cholesterol supplements: none Past cholesterol medications: pravastatin Medication compliance: excellent compliance Aspirin: no Recent stressors: no Recurrent headaches: no Visual changes: no Palpitations: no Dyspnea: no Chest pain: no Lower extremity edema: yes Dizzy/lightheaded: yes  ASTHMA Asthma status: controlled Satisfied with current treatment?: yes Albuterol/rescue inhaler frequency:  1-3 times a day Dyspnea frequency: not happening Wheezing frequency: 1-3 times a day with the pollen Cough frequency: daily Nocturnal symptom frequency: 3-4x a night Limitation of activity: yes Current upper respiratory symptoms: yes  Menopausal Symptoms: yes- hot flashes, doing OK with it  Depression Screen done today and results listed below:  Depression screen [facility] 2/9 08/03/2017 05/06/2017 01/17/2015  Decreased Interest 0 0 2  Down, Depressed, Hopeless 0 0 0  PHQ - 2 Score 0 0 2  Altered sleeping - 0 0  Tired, decreased energy - 1 0    Change in appetite - 0 0  Feeling bad or failure about yourself  - 0 0  Trouble concentrating - 0 0  Moving slowly or fidgety/restless - 0 0  Suicidal thoughts - 0 0  PHQ-9 Score - 1 2  Difficult doing work/chores - - Not difficult at all     Past Medical History:  Past Medical History:  Diagnosis Date  . Asthma   . COPD (chronic obstructive pulmonary disease) (HCC)   . GERD (gastroesophageal reflux disease)   . Hyperlipidemia   . Hypertension   . Low blood potassium    Due to Stomach Virus  . Low serum sodium    Due to stomach virus    Surgical History:  Past Surgical History:  Procedure Laterality Date  . CESAREAN SECTION    . INCISION AND DRAINAGE ABSCESS Left    leg  . TONSILLECTOMY      Medications:  Current Outpatient Medications on File Prior to Visit  Medication Sig  . DiphenhydrAMINE HCl (ALLERGY MED PO) Take by mouth daily.   No current facility-administered medications on file prior to visit.     Allergies:  Allergies  Allergen Reactions  . Sulfa Antibiotics Rash    Social History:  Social History   Socioeconomic History  . Marital status: Single    Spouse name: Not on file  . Number of children: Not on file  . Years of education: Not on file  . Highest education level: Not on file  Occupational History  . Not on file  Social Needs  . Financial resource strain: Not  on file  . Food insecurity:    Worry: Not on file    Inability: Not on file  . Transportation needs:    Medical: Not on file    Non-medical: Not on file  Tobacco Use  . Smoking status: Current Every Day Smoker    Packs/day: 0.25    Types: Cigarettes  . Smokeless tobacco: Never Used  Substance and Sexual Activity  . Alcohol use: Yes    Alcohol/week: 0.0 oz    Comment: one or less per day  . Drug use: No  . Sexual activity: Yes    Birth control/protection: None  Lifestyle  . Physical activity:    Days per week: Not on file    Minutes per session: Not on file  .  Stress: Not on file  Relationships  . Social connections:    Talks on phone: Not on file    Gets together: Not on file    Attends religious service: Not on file    Active member of club or organization: Not on file    Attends meetings of clubs or organizations: Not on file    Relationship status: Not on file  . Intimate partner violence:    Fear of current or ex partner: Not on file    Emotionally abused: Not on file    Physically abused: Not on file    Forced sexual activity: Not on file  Other Topics Concern  . Not on file  Social History Narrative  . Not on file   Social History   Tobacco Use  Smoking Status Current Every Day Smoker  . Packs/day: 0.25  . Types: Cigarettes  Smokeless Tobacco Never Used   Social History   Substance and Sexual Activity  Alcohol Use Yes  . Alcohol/week: 0.0 oz   Comment: one or less per day    Family History:  Family History  Problem Relation Age of Onset  . Hyperlipidemia Mother   . Hypertension Mother   . Cancer Father        Lung  . Hypertension Father   . Hyperlipidemia Sister   . Drug abuse Son     Past medical history, surgical history, medications, allergies, family history and social history reviewed with patient today and changes made to appropriate areas of the chart.   Review of Systems  Constitutional: Positive for diaphoresis. Negative for chills, fever, malaise/fatigue and weight loss.  HENT: Positive for nosebleeds. Negative for congestion, ear discharge, ear pain, hearing loss, sinus pain, sore throat and tinnitus.   Eyes: Negative.   Respiratory: Positive for cough and wheezing. Negative for hemoptysis, sputum production, shortness of breath and stridor.   Cardiovascular: Positive for leg swelling. Negative for chest pain, palpitations, orthopnea, claudication and PND.  Gastrointestinal: Positive for heartburn. Negative for abdominal pain, blood in stool, constipation, diarrhea, melena, nausea and vomiting.    Genitourinary: Negative.   Musculoskeletal: Positive for joint pain. Negative for back pain, falls, myalgias and neck pain.  Skin: Positive for rash (occasionally, about 2x a month). Negative for itching.  Neurological: Positive for dizziness. Negative for tingling, tremors, sensory change, speech change, focal weakness, seizures, loss of consciousness, weakness and headaches.  Endo/Heme/Allergies: Positive for environmental allergies. Negative for polydipsia. Bruises/bleeds easily.  Psychiatric/Behavioral: Negative.     All other ROS negative except what is listed above and in the HPI.      Objective:    BP (!) 148/83 (BP Location: Left Arm, Patient Position: Sitting, Cuff Size: Normal)  Pulse 92   Temp 98.7 F (37.1 C)   Ht 5' (1.524 m)   Wt 206 lb 3 oz (93.5 kg)   LMP 08/02/2017 (Exact Date)   SpO2 97%   BMI 40.27 kg/m   Wt Readings from Last 3 Encounters:  08/03/17 206 lb 3 oz (93.5 kg)  06/19/17 208 lb 1.6 oz (94.4 kg)  05/06/17 208 lb 9 oz (94.6 kg)    Physical Exam  Results for orders placed or performed in visit on 06/19/17  RA Qn+CCP(IgG/A)+SjoSSA+SjoSSB  Result Value Ref Range   Rhuematoid fact SerPl-aCnc 13.8 0.0 - 13.9 IU/mL   ENA SSA (RO) Ab <0.2 0.0 - 0.9 AI   ENA SSB (LA) Ab <0.2 0.0 - 0.9 AI   Cyclic Citrullin Peptide Ab 2 0 - 19 units  CBC with Differential/Platelet  Result Value Ref Range   WBC 9.0 3.4 - 10.8 x10E3/uL   RBC 4.67 3.77 - 5.28 x10E6/uL   Hemoglobin 15.4 11.1 - 15.9 g/dL   Hematocrit 16.1 09.6 - 46.6 %   MCV 96 79 - 97 fL   MCH 33.0 26.6 - 33.0 pg   MCHC 34.4 31.5 - 35.7 g/dL   RDW 04.5 40.9 - 81.1 %   Platelets 270 150 - 379 x10E3/uL   Neutrophils 70 Not Estab. %   Lymphs 20 Not Estab. %   Monocytes 6 Not Estab. %   Eos 4 Not Estab. %   Basos 0 Not Estab. %   Neutrophils Absolute 6.4 1.4 - 7.0 x10E3/uL   Lymphocytes Absolute 1.8 0.7 - 3.1 x10E3/uL   Monocytes Absolute 0.5 0.1 - 0.9 x10E3/uL   EOS (ABSOLUTE) 0.3 0.0 - 0.4  x10E3/uL   Basophils Absolute 0.0 0.0 - 0.2 x10E3/uL   Immature Granulocytes 0 Not Estab. %   Immature Grans (Abs) 0.0 0.0 - 0.1 x10E3/uL  Comprehensive metabolic panel  Result Value Ref Range   Glucose 100 (H) 65 - 99 mg/dL   BUN 22 6 - 24 mg/dL   Creatinine, Ser 9.14 0.57 - 1.00 mg/dL   GFR calc non Af Amer 103 >59 mL/min/1.73   GFR calc Af Amer 119 >59 mL/min/1.73   BUN/Creatinine Ratio 34 (H) 9 - 23   Sodium 136 134 - 144 mmol/L   Potassium 4.3 3.5 - 5.2 mmol/L   Chloride 99 96 - 106 mmol/L   CO2 23 20 - 29 mmol/L   Calcium 9.3 8.7 - 10.2 mg/dL   Total Protein 6.9 6.0 - 8.5 g/dL   Albumin 4.5 3.5 - 5.5 g/dL   Globulin, Total 2.4 1.5 - 4.5 g/dL   Albumin/Globulin Ratio 1.9 1.2 - 2.2   Bilirubin Total 0.3 0.0 - 1.2 mg/dL   Alkaline Phosphatase 117 39 - 117 IU/L   AST 11 0 - 40 IU/L   ALT 14 0 - 32 IU/L  Ehrlichia antibody panel  Result Value Ref Range   E.Chaffeensis (HME) IgG 1:64 (H) Neg:<1:64   E. Chaffeensis (HME) IgM Titer Negative Neg:<1:20   HGE IgG Titer Negative Neg:<1:64   HGE IgM Titer Negative Neg:<1:20  Babesia microti Antibody Panel  Result Value Ref Range   Babesia microti IgM <1:10 Neg:<1:10   Babesia microti IgG <1:10 Neg:<1:10  Rocky mtn spotted fvr abs pnl(IgG+IgM)  Result Value Ref Range   RMSF IgG Equivocal Negative   RMSF IgM 0.41 0.00 - 0.89 index  Lyme Ab/Western Blot Reflex  Result Value Ref Range   Lyme IgG/IgM Ab <0.91 0.00 - 0.90 ISR  LYME DISEASE AB, QUANT, IGM <0.80 0.00 - 0.79 index  TSH  Result Value Ref Range   TSH 1.240 0.450 - 4.500 uIU/mL  VITAMIN D 25 Hydroxy (Vit-D Deficiency, Fractures)  Result Value Ref Range   Vit D, 25-Hydroxy 13.4 (L) 30.0 - 100.0 ng/mL  ANA  Result Value Ref Range   Anit Nuclear Antibody(ANA) Positive (A) Negative  Sedimentation rate  Result Value Ref Range   Sed Rate 25 0 - 40 mm/hr  CK  Result Value Ref Range   Total CK 52 24 - 173 U/L  RMSF, IgG, IFA  Result Value Ref Range   RMSF, IGG, IFA  1:64 (H) Neg <1:64      Assessment & Plan:   Problem List Items Addressed This Visit      Respiratory   Asthma    Not under good control. Triggered by allergies. Will start singulair and recheck 1 month.       Relevant Medications   montelukast (SINGULAIR) 10 MG tablet   albuterol (PROAIR HFA) 108 (90 Base) MCG/ACT inhaler   Fluticasone-Salmeterol (ADVAIR DISKUS) 250-50 MCG/DOSE AEPB   albuterol (PROVENTIL) (2.5 MG/3ML) 0.083% nebulizer solution   Other Relevant Orders   CBC with Differential/Platelet   Comprehensive metabolic panel   TSH   UA/M w/rflx Culture, Routine     Musculoskeletal and Integument   Osteoarthritis    Likely. Will check x-rays. Await results. Call with any concerns.       Relevant Medications   naproxen (NAPROSYN) 500 MG tablet   Other Relevant Orders   DG Hand Complete Right   DG Hand Complete Left   DG Knee Complete 4 Views Left   DG Knee Complete 4 Views Right     Genitourinary   Benign hypertensive renal disease    Will increase her lisinopril to  and stop her HCTZ. Call with any concerns. Refills given.       Relevant Orders   CBC with Differential/Platelet   Comprehensive metabolic panel   Microalbumin, Urine Waived   TSH   UA/M w/rflx Culture, Routine     Other   Hyperlipidemia    Rechecking levels today. Continue pravastatin. Call with any concerns. Refills given today.      Relevant Medications   pravastatin (PRAVACHOL) 40 MG tablet   lisinopril (PRINIVIL,ZESTRIL) 20 MG tablet   Other Relevant Orders   CBC with Differential/Platelet   Comprehensive metabolic panel   Lipid Panel w/o Chol/HDL Ratio   TSH   UA/M w/rflx Culture, Routine   Tobacco abuse    Not interested in quitting right now. Will call if she changes her mind.       Relevant Orders   CBC with Differential/Platelet   Comprehensive metabolic panel   TSH   UA/M w/rflx Culture, Routine   Polycythemia    Rechecking levels today. Await results.        Relevant Orders   CBC with Differential/Platelet   Comprehensive metabolic panel   TSH   UA/M w/rflx Culture, Routine    Other Visit Diagnoses    Routine general medical examination at a health care facility    -  Primary   Vaccines up to date. Screening labs checked today. Pap- to be done next visit. Mammogram and colonoscopy ordered today. Call with any concerns.    Encounter for screening mammogram for breast cancer       Mammogram ordered today.   Screening for colon cancer       Referral to  GI made today.   Relevant Orders   Ambulatory referral to Gastroenterology   Abnormal menses       Unclear if she is post-menopausal or it's been 6 months since she had a period. Will check labs to see if she's postmenopausal- if she is, to GYN for eval.    Relevant Orders   LH   FSH   Estradiol   Testosterone, free, total([facility])   Prolactin   Rash       Allergy to sulfa, unclear cross reactivity with HCTZ- will stop and recheck 1 month.        Follow up plan: Return 5 weeks, for follow up BP, Rash, menses, asthma, arthritis.   LABORATORY TESTING:  - Pap smear: On menses- will hold off today  IMMUNIZATIONS:   - Tdap: Tetanus vaccination status reviewed: last tetanus booster within 10 years. - Influenza: Postponed to flu season - Pneumovax: Up to date - Prevnar: Not applicable  SCREENING: -Mammogram: Ordered today  - Colonoscopy: Ordered today   PATIENT COUNSELING:   Advised to take 1 mg of folate supplement per day if capable of pregnancy.   Sexuality: Discussed sexually transmitted diseases, partner selection, use of condoms, avoidance of unintended pregnancy  and contraceptive alternatives.   Advised to avoid cigarette smoking.  I discussed with the patient that most people either abstain from alcohol or drink within safe limits (<=14/week and <=4 drinks/occasion for males, <=7/weeks and <= 3 drinks/occasion for females) and that the risk for alcohol disorders  and other health effects rises proportionally with the number of drinks per week and how often a drinker exceeds daily limits.  Discussed cessation/primary prevention of drug use and availability of treatment for abuse.   Diet: Encouraged to adjust caloric intake to maintain  or achieve ideal body weight, to reduce intake of dietary saturated fat and total fat, to limit sodium intake by avoiding high sodium foods and not adding table salt, and to maintain adequate dietary potassium and calcium preferably from fresh fruits, vegetables, and low-fat dairy products.    stressed the importance of regular exercise  Injury prevention: Discussed safety belts, safety helmets, smoke detector, smoking near bedding or upholstery.   Dental health: Discussed importance of regular tooth brushing, flossing, and dental visits.    NEXT PREVENTATIVE PHYSICAL DUE IN 1 YEAR. Return 5 weeks, for follow up BP, Rash, menses, asthma, arthritis.

## 2017-08-03 NOTE — Assessment & Plan Note (Signed)
Not interested in quitting right now. Will call if she changes her mind.  

## 2017-08-03 NOTE — Telephone Encounter (Signed)
Please let her know that her x-rays showed arthritis in her knees and her thumbs. Try the naproxen and we'll see how she does in a month. Thanks!

## 2017-08-04 ENCOUNTER — Telehealth: Payer: Self-pay | Admitting: Family Medicine

## 2017-08-04 DIAGNOSIS — N950 Postmenopausal bleeding: Secondary | ICD-10-CM

## 2017-08-04 NOTE — Telephone Encounter (Signed)
Pt. Given lab results and instructions. States she will follow up with GYN as instructed. Will await appointment.

## 2017-08-04 NOTE — Telephone Encounter (Signed)
Please let her know that her labs came back normal- it's unclear from her levels if she has gone through menopause yet, but I'd like to be safe and send her to the GYN to be checked. I've put in a referral and I'll see her next week.

## 2017-08-04 NOTE — Telephone Encounter (Signed)
Called patient no answer, left a message for patient to return my call.

## 2017-08-05 ENCOUNTER — Telehealth: Payer: Self-pay

## 2017-08-05 NOTE — Telephone Encounter (Signed)
Gastroenterology Pre-Procedure Review  Request Date: 10/26/17  Requesting Physician: Dr. Tobi Bastos   PATIENT REVIEW QUESTIONS: The patient responded to the following health history questions as indicated:    1. Are you having any GI issues? No  2. Do you have a personal history of Polyps? No  3. Do you have a family history of Colon Cancer or Polyps? No  4. Diabetes Mellitus? No  5. Joint replacements in the past 12 months? No  6. Major health problems in the past 3 months? No  7. Any artificial heart valves, MVP, or defibrillator? No     MEDICATIONS & ALLERGIES:    Patient reports the following regarding taking any anticoagulation/antiplatelet therapy:   Plavix, Coumadin, Eliquis, Xarelto, Lovenox, Pradaxa, Brilinta, or Effient? No  Aspirin? No   Patient confirms/reports the following medications:  Current Outpatient Medications  Medication Sig Dispense Refill  . albuterol (PROAIR HFA) 108 (90 Base) MCG/ACT inhaler Inhale 2 puffs into the lungs every 4 (four) hours as needed. 18 Inhaler 6  . albuterol (PROVENTIL) (2.5 MG/3ML) 0.083% nebulizer solution USE 1 VIAL IN NEBULIZER EVERY 4 HOURS 450 mL 6  . DiphenhydrAMINE HCl (ALLERGY MED PO) Take by mouth daily.    . fluticasone (FLONASE) 50 MCG/ACT nasal spray Place 2 sprays into both nostrils daily. 16 g 6  . Fluticasone-Salmeterol (ADVAIR DISKUS) 250-50 MCG/DOSE AEPB TAKE 1 PUFF BY MOUTH TWICE A DAY 60 each 6  . gabapentin (NEURONTIN) 400 MG capsule Take 1 cap in the AM and 2 caps at bedtime 90 capsule 3  . lisinopril (PRINIVIL,ZESTRIL) 20 MG tablet Take 1 tablet (20 mg total) by mouth daily. 90 tablet 1  . montelukast (SINGULAIR) 10 MG tablet Take 1 tablet (10 mg total) by mouth at bedtime. 90 tablet 3  . naproxen (NAPROSYN) 500 MG tablet Take 1 tablet (500 mg total) by mouth 2 (two) times daily with a meal. 60 tablet 6  . omeprazole (PRILOSEC) 20 MG capsule Take 1 capsule (20 mg total) by mouth daily. 30 capsule 3  . pravastatin  (PRAVACHOL) 40 MG tablet TAKE 1 TABLET (40 MG TOTAL) BY MOUTH DAILY. 90 tablet 1   No current facility-administered medications for this visit.     Patient confirms/reports the following allergies:  Allergies  Allergen Reactions  . Sulfa Antibiotics Rash    No orders of the defined types were placed in this encounter.   AUTHORIZATION INFORMATION Primary Insurance: 1D#: Group #:  Secondary Insurance: 1D#: Group #:  SCHEDULE INFORMATION: Date: 10/26/2017 Time: Location: [facility]

## 2017-08-07 LAB — TESTOSTERONE, FREE, TOTAL, SHBG
SEX HORMONE BINDING: 98.8 nmol/L (ref 17.3–125.0)
TESTOSTERONE FREE: 0.9 pg/mL (ref 0.0–4.2)
TESTOSTERONE: 9 ng/dL (ref 3–41)

## 2017-08-07 LAB — COMPREHENSIVE METABOLIC PANEL
ALBUMIN: 4.6 g/dL (ref 3.5–5.5)
ALT: 14 IU/L (ref 0–32)
AST: 16 IU/L (ref 0–40)
Albumin/Globulin Ratio: 1.8 (ref 1.2–2.2)
Alkaline Phosphatase: 112 IU/L (ref 39–117)
BUN / CREAT RATIO: 22 (ref 9–23)
BUN: 15 mg/dL (ref 6–24)
Bilirubin Total: 0.4 mg/dL (ref 0.0–1.2)
CALCIUM: 9.8 mg/dL (ref 8.7–10.2)
CHLORIDE: 100 mmol/L (ref 96–106)
CO2: 25 mmol/L (ref 20–29)
Creatinine, Ser: 0.69 mg/dL (ref 0.57–1.00)
GFR, EST AFRICAN AMERICAN: 117 mL/min/{1.73_m2} (ref >59)
GFR, EST NON AFRICAN AMERICAN: 101 mL/min/{1.73_m2} (ref >59)
GLOBULIN, TOTAL: 2.5 g/dL (ref 1.5–4.5)
Glucose: 99 mg/dL (ref 65–99)
POTASSIUM: 4.5 mmol/L (ref 3.5–5.2)
Sodium: 139 mmol/L (ref 134–144)
TOTAL PROTEIN: 7.1 g/dL (ref 6.0–8.5)

## 2017-08-07 LAB — LUTEINIZING HORMONE: LH: 30.4 m[IU]/mL

## 2017-08-07 LAB — LIPID PANEL W/O CHOL/HDL RATIO
Cholesterol, Total: 215 mg/dL — ABNORMAL HIGH (ref 100–199)
HDL: 79 mg/dL (ref >39)
LDL Calculated: 111 mg/dL — ABNORMAL HIGH (ref 0–99)
Triglycerides: 123 mg/dL (ref 0–149)
VLDL Cholesterol Cal: 25 mg/dL (ref 5–40)

## 2017-08-07 LAB — CBC WITH DIFFERENTIAL/PLATELET

## 2017-08-07 LAB — TSH: TSH: 1.43 u[IU]/mL (ref 0.450–4.500)

## 2017-08-07 LAB — FOLLICLE STIMULATING HORMONE: FSH: 49.3 m[IU]/mL

## 2017-08-07 LAB — ESTRADIOL: ESTRADIOL: 9.2 pg/mL

## 2017-08-07 LAB — PROLACTIN: Prolactin: 7.8 ng/mL (ref 4.8–23.3)

## 2017-08-10 ENCOUNTER — Other Ambulatory Visit: Payer: Self-pay

## 2017-08-10 DIAGNOSIS — Z1211 Encounter for screening for malignant neoplasm of colon: Secondary | ICD-10-CM

## 2017-09-07 ENCOUNTER — Encounter: Payer: Self-pay | Admitting: Obstetrics and Gynecology

## 2017-09-07 ENCOUNTER — Ambulatory Visit: Payer: Medicaid Other | Admitting: Family Medicine

## 2017-09-11 ENCOUNTER — Ambulatory Visit: Payer: Medicaid Other | Admitting: Family Medicine

## 2017-09-24 ENCOUNTER — Ambulatory Visit: Payer: Medicaid Other | Admitting: Family Medicine

## 2017-09-28 ENCOUNTER — Encounter: Payer: Self-pay | Admitting: Family Medicine

## 2017-09-28 ENCOUNTER — Ambulatory Visit: Payer: Medicaid Other | Admitting: Family Medicine

## 2017-09-28 VITALS — BP 115/74 | HR 82 | Temp 98.6°F | Wt 213.1 lb

## 2017-09-28 DIAGNOSIS — I129 Hypertensive chronic kidney disease with stage 1 through stage 4 chronic kidney disease, or unspecified chronic kidney disease: Secondary | ICD-10-CM | POA: Diagnosis not present

## 2017-09-28 DIAGNOSIS — M150 Primary generalized (osteo)arthritis: Secondary | ICD-10-CM | POA: Diagnosis not present

## 2017-09-28 DIAGNOSIS — N950 Postmenopausal bleeding: Secondary | ICD-10-CM | POA: Diagnosis not present

## 2017-09-28 DIAGNOSIS — M159 Polyosteoarthritis, unspecified: Secondary | ICD-10-CM

## 2017-09-28 NOTE — Progress Notes (Signed)
BP 115/74 (BP Location: Left Arm, Patient Position: Sitting, Cuff Size: Large)   Pulse 82   Temp 98.6 F (37 C)   Wt 213 lb 1 oz (96.6 kg)   LMP 08/02/2017 (Exact Date)   SpO2 100%   BMI 41.61 kg/m    Subjective:    Patient ID: Nancy Stout, female    DOB: 05/15/1965, 52 y.o.   MRN: [id number]  HPI: Bernarda Erck is a 52 y.o. female  Chief Complaint  Patient presents with  . Hypertension  . Arthritis   HYPERTENSION Hypertension status: controlled  Satisfied with current treatment? yes Duration of hypertension: chronic BP monitoring frequency:  not checking BP medication side effects:  no Medication compliance: excellent compliance Previous BP meds: lisinopril Aspirin: no Recurrent headaches: no Visual changes: no Palpitations: no Dyspnea: no Chest pain: no Lower extremity edema: no Dizzy/lightheaded: no  Arthritis is doing well on the naproxen. Stiff in the morning, but then eases off.   Relevant past medical, surgical, family and social history reviewed and updated as indicated. Interim medical history since our last visit reviewed. Allergies and medications reviewed and updated.  Review of Systems  Constitutional: Negative.   Respiratory: Negative.   Cardiovascular: Negative.   Musculoskeletal: Positive for arthralgias. Negative for back pain, gait problem, joint swelling, myalgias, neck pain and neck stiffness.  Skin: Negative.   Psychiatric/Behavioral: Negative.     Per HPI unless specifically indicated above     Objective:    BP 115/74 (BP Location: Left Arm, Patient Position: Sitting, Cuff Size: Large)   Pulse 82   Temp 98.6 F (37 C)   Wt 213 lb 1 oz (96.6 kg)   LMP 08/02/2017 (Exact Date)   SpO2 100%   BMI 41.61 kg/m   Wt Readings from Last 3 Encounters:  09/28/17 213 lb 1 oz (96.6 kg)  08/03/17 206 lb 3 oz (93.5 kg)  06/19/17 208 lb 1.6 oz (94.4 kg)    Physical Exam  Constitutional: She is oriented to person, place, and time.  She appears well-developed and well-nourished. No distress.  HENT:  Head: Normocephalic and atraumatic.  Right Ear: Hearing normal.  Left Ear: Hearing normal.  Nose: Nose normal.  Eyes: Conjunctivae and lids are normal. Right eye exhibits no discharge. Left eye exhibits no discharge. No scleral icterus.  Cardiovascular: Normal rate, regular rhythm, normal heart sounds and intact distal pulses. Exam reveals no gallop and no friction rub.  No murmur heard. Pulmonary/Chest: Effort normal and breath sounds normal. No stridor. No respiratory distress. She has no wheezes. She has no rales. She exhibits no tenderness.  Musculoskeletal: Normal range of motion.  Neurological: She is alert and oriented to person, place, and time.  Skin: Skin is warm, dry and intact. Capillary refill takes less than 2 seconds. No rash noted. She is not diaphoretic. No erythema. No pallor.  Psychiatric: She has a normal mood and affect. Her speech is normal and behavior is normal. Judgment and thought content normal. Cognition and memory are normal.  Nursing note and vitals reviewed.   Results for orders placed or performed in visit on 08/03/17  CBC with Differential/Platelet  Result Value Ref Range   WBC CANCELED x10E3/uL   RBC CANCELED    Hemoglobin CANCELED    Hematocrit CANCELED    Platelets CANCELED    Neutrophils CANCELED    Lymphs CANCELED    Monocytes CANCELED    Eos CANCELED    Lymphocytes Absolute CANCELED    EOS (ABSOLUTE)  CANCELED    Basophils Absolute CANCELED   Comprehensive metabolic panel  Result Value Ref Range   Glucose 99 65 - 99 mg/dL   BUN 15 6 - 24 mg/dL   Creatinine, Ser 8.110.69 0.57 - 1.00 mg/dL   GFR calc non Af Amer 101 >59 mL/min/1.73   GFR calc Af Amer 117 >59 mL/min/1.73   BUN/Creatinine Ratio 22 9 - 23   Sodium 139 134 - 144 mmol/L   Potassium 4.5 3.5 - 5.2 mmol/L   Chloride 100 96 - 106 mmol/L   CO2 25 20 - 29 mmol/L   Calcium 9.8 8.7 - 10.2 mg/dL   Total Protein 7.1 6.0 -  8.5 g/dL   Albumin 4.6 3.5 - 5.5 g/dL   Globulin, Total 2.5 1.5 - 4.5 g/dL   Albumin/Globulin Ratio 1.8 1.2 - 2.2   Bilirubin Total 0.4 0.0 - 1.2 mg/dL   Alkaline Phosphatase 112 39 - 117 IU/L   AST 16 0 - 40 IU/L   ALT 14 0 - 32 IU/L  Lipid Panel w/o Chol/HDL Ratio  Result Value Ref Range   Cholesterol, Total 215 (H) 100 - 199 mg/dL   Triglycerides 914123 0 - 149 mg/dL   HDL 79 >78>39 mg/dL   VLDL Cholesterol Cal 25 5 - 40 mg/dL   LDL Calculated 295111 (H) 0 - 99 mg/dL  Microalbumin, Urine Waived  Result Value Ref Range   Microalb, Ur Waived 30 (H) 0 - 19 mg/L   Creatinine, Urine Waived 200 10 - 300 mg/dL   Microalb/Creat Ratio <30 <30 mg/g  TSH  Result Value Ref Range   TSH 1.430 0.450 - 4.500 uIU/mL  UA/M w/rflx Culture, Routine  Result Value Ref Range   Specific Gravity, UA 1.020 1.005 - 1.030   pH, UA 5.5 5.0 - 7.5   Color, UA Yellow Yellow   Appearance Ur Cloudy (A) Clear   Leukocytes, UA Negative Negative   Protein, UA Negative Negative/Trace   Glucose, UA Negative Negative   Ketones, UA Negative Negative   RBC, UA Negative Negative   Bilirubin, UA Negative Negative   Urobilinogen, Ur 0.2 0.2 - 1.0 mg/dL   Nitrite, UA Negative Negative  LH  Result Value Ref Range   LH 30.4 mIU/mL  FSH  Result Value Ref Range   FSH 49.3 mIU/mL  Estradiol  Result Value Ref Range   Estradiol 9.2 pg/mL  Testosterone, free, total([facility])  Result Value Ref Range   Testosterone 9 3 - 41 ng/dL   Testosterone, Free 0.9 0.0 - 4.2 pg/mL   Sex Hormone Binding 98.8 17.3 - 125.0 nmol/L  Prolactin  Result Value Ref Range   Prolactin 7.8 4.8 - 23.3 ng/mL      Assessment & Plan:   Problem List Items Addressed This Visit      Musculoskeletal and Integument   Osteoarthritis    Doing better on her naproxen. Continue current regimen. Continue to monitor. Call with any concerns.         Genitourinary   Benign hypertensive renal disease - Primary    Under good control. Continue  current regimen. Continue to monitor. Recheck 6 months. Call with any concerns.       Relevant Orders   Basic metabolic panel   CBC with Differential/Platelet    Other Visit Diagnoses    Postmenopausal bleeding       Seeing GYN in July. Call with any concerns.        Follow up plan: Return in  about 4 months (around 01/28/2018) for Follow up.

## 2017-09-28 NOTE — Assessment & Plan Note (Signed)
Under good control. Continue current regimen. Continue to monitor. Recheck 6 months. Call with any concerns.  

## 2017-09-28 NOTE — Patient Instructions (Signed)
[facility]  Address: [address], [place], [place] [address]  Phone: [phone]  

## 2017-09-28 NOTE — Assessment & Plan Note (Signed)
Doing better on her naproxen. Continue current regimen. Continue to monitor. Call with any concerns.

## 2017-09-29 ENCOUNTER — Encounter: Payer: Self-pay | Admitting: Family Medicine

## 2017-09-29 LAB — CBC WITH DIFFERENTIAL/PLATELET
BASOS ABS: 0 10*3/uL (ref 0.0–0.2)
Basos: 1 %
EOS (ABSOLUTE): 0.4 10*3/uL (ref 0.0–0.4)
Eos: 5 %
Hematocrit: 45.3 % (ref 34.0–46.6)
Hemoglobin: 15.4 g/dL (ref 11.1–15.9)
IMMATURE GRANS (ABS): 0 10*3/uL (ref 0.0–0.1)
IMMATURE GRANULOCYTES: 0 %
LYMPHS: 22 %
Lymphocytes Absolute: 1.6 10*3/uL (ref 0.7–3.1)
MCH: 33.9 pg — ABNORMAL HIGH (ref 26.6–33.0)
MCHC: 34 g/dL (ref 31.5–35.7)
MCV: 100 fL — ABNORMAL HIGH (ref 79–97)
Monocytes Absolute: 0.7 10*3/uL (ref 0.1–0.9)
Monocytes: 10 %
NEUTROS PCT: 62 %
Neutrophils Absolute: 4.5 10*3/uL (ref 1.4–7.0)
PLATELETS: 267 10*3/uL (ref 150–450)
RBC: 4.54 x10E6/uL (ref 3.77–5.28)
RDW: 13.9 % (ref 12.3–15.4)
WBC: 7.3 10*3/uL (ref 3.4–10.8)

## 2017-09-29 LAB — BASIC METABOLIC PANEL
BUN / CREAT RATIO: 22 (ref 9–23)
BUN: 13 mg/dL (ref 6–24)
CALCIUM: 9.2 mg/dL (ref 8.7–10.2)
CHLORIDE: 103 mmol/L (ref 96–106)
CO2: 24 mmol/L (ref 20–29)
Creatinine, Ser: 0.58 mg/dL (ref 0.57–1.00)
GFR calc Af Amer: 123 mL/min/{1.73_m2} (ref >59)
GFR calc non Af Amer: 107 mL/min/{1.73_m2} (ref >59)
GLUCOSE: 93 mg/dL (ref 65–99)
POTASSIUM: 5.1 mmol/L (ref 3.5–5.2)
Sodium: 139 mmol/L (ref 134–144)

## 2017-10-14 ENCOUNTER — Telehealth: Payer: Self-pay | Admitting: Family Medicine

## 2017-10-14 NOTE — Telephone Encounter (Signed)
I'm happy to write out a new Rx- can we get this written up and I'll sign it?

## 2017-10-14 NOTE — Telephone Encounter (Signed)
Copied from CRM [id number]. Topic: Quick Communication - See Telephone Encounter >> Oct 14, 2017 11:07 AM Windy KalataMichael, Gerrit Rafalski L, NT wrote: CRM for notification. See Telephone encounter for: 10/14/17.  [place] and her Nebulizer machine broke. She is needing a new prescription sent to the pharmacy below. Pt does not have fax number. Please advise.   [address], [place], [place] [id number] Phone# [phone]

## 2017-10-14 NOTE — Telephone Encounter (Signed)
Order written, will get signature and fax. 

## 2017-10-14 NOTE — Telephone Encounter (Signed)
The [facility] only has the kids one, but she doesn't care if she get's that one or not, and she doesn't know if the prescription will have to state that or not.

## 2017-10-15 NOTE — Telephone Encounter (Signed)
OK to do kids one if she calls back.

## 2017-10-15 NOTE — Telephone Encounter (Signed)
Called patient, no answer, left a message asking patient to return my call if she needs anything.

## 2017-10-26 ENCOUNTER — Encounter: Payer: Self-pay | Admitting: Obstetrics and Gynecology

## 2017-10-26 ENCOUNTER — Encounter: Payer: Self-pay | Admitting: Certified Registered"

## 2017-10-26 ENCOUNTER — Encounter: Admission: RE | Payer: Self-pay | Source: Ambulatory Visit

## 2017-10-26 ENCOUNTER — Ambulatory Visit: Admission: RE | Admit: 2017-10-26 | Payer: Medicaid Other | Source: Ambulatory Visit | Admitting: Gastroenterology

## 2017-10-26 SURGERY — COLONOSCOPY WITH PROPOFOL
Anesthesia: General

## 2017-12-09 ENCOUNTER — Other Ambulatory Visit: Payer: Self-pay | Admitting: Family Medicine

## 2017-12-19 DIAGNOSIS — R8290 Unspecified abnormal findings in urine: Secondary | ICD-10-CM | POA: Diagnosis not present

## 2018-01-11 ENCOUNTER — Encounter: Payer: Medicaid Other | Admitting: Family Medicine

## 2018-01-17 ENCOUNTER — Other Ambulatory Visit: Payer: Self-pay

## 2018-01-17 ENCOUNTER — Emergency Department: Payer: Medicaid Other

## 2018-01-17 ENCOUNTER — Emergency Department
Admission: EM | Admit: 2018-01-17 | Discharge: 2018-01-17 | Disposition: A | Discharge status: Home / Self Care | Payer: Medicaid Other | Attending: Emergency Medicine | Admitting: Emergency Medicine

## 2018-01-17 DIAGNOSIS — Y929 Unspecified place or not applicable: Secondary | ICD-10-CM | POA: Diagnosis not present

## 2018-01-17 DIAGNOSIS — S8002XA Contusion of left knee, initial encounter: Secondary | ICD-10-CM | POA: Diagnosis not present

## 2018-01-17 DIAGNOSIS — J449 Chronic obstructive pulmonary disease, unspecified: Secondary | ICD-10-CM | POA: Diagnosis not present

## 2018-01-17 DIAGNOSIS — Z79899 Other long term (current) drug therapy: Secondary | ICD-10-CM | POA: Diagnosis not present

## 2018-01-17 DIAGNOSIS — M25562 Pain in left knee: Secondary | ICD-10-CM | POA: Diagnosis not present

## 2018-01-17 DIAGNOSIS — I10 Essential (primary) hypertension: Secondary | ICD-10-CM | POA: Diagnosis not present

## 2018-01-17 DIAGNOSIS — Y939 Activity, unspecified: Secondary | ICD-10-CM | POA: Diagnosis not present

## 2018-01-17 DIAGNOSIS — W010XXA Fall on same level from slipping, tripping and stumbling without subsequent striking against object, initial encounter: Secondary | ICD-10-CM | POA: Diagnosis not present

## 2018-01-17 DIAGNOSIS — Y999 Unspecified external cause status: Secondary | ICD-10-CM | POA: Diagnosis not present

## 2018-01-17 DIAGNOSIS — F17210 Nicotine dependence, cigarettes, uncomplicated: Secondary | ICD-10-CM | POA: Insufficient documentation

## 2018-01-17 DIAGNOSIS — J45909 Unspecified asthma, uncomplicated: Secondary | ICD-10-CM | POA: Insufficient documentation

## 2018-01-17 DIAGNOSIS — S8992XA Unspecified injury of left lower leg, initial encounter: Secondary | ICD-10-CM | POA: Diagnosis not present

## 2018-01-17 MED ORDER — IBUPROFEN 600 MG PO TABS: 600.0000 mg | ORAL_TABLET | Freq: Three times a day (TID) | ORAL | 0 refills | Status: DC | PRN

## 2018-01-17 MED ORDER — IBUPROFEN 600 MG PO TABS: 600.0000 mg | ORAL_TABLET | Freq: Once | ORAL | Status: DC

## 2018-01-17 MED ORDER — ACETAMINOPHEN 500 MG PO TABS
ORAL_TABLET | ORAL | Status: AC
  Administered 2018-01-17: 1000 mg via ORAL
  Filled 2018-01-17: qty 2

## 2018-01-17 MED ORDER — ACETAMINOPHEN 500 MG PO TABS
1000.0000 mg | ORAL_TABLET | Freq: Once | ORAL | Status: AC
  Administered 2018-01-17: 1000 mg via ORAL

## 2018-01-17 NOTE — Discharge Instructions (Signed)
It was a pleasure to take care of you today, and thank you for coming to our emergency department.  If you have any questions or concerns before leaving please ask the nurse to grab me and I'm more than happy to go through your aftercare instructions again.  If you have any concerns once you are home that you are not improving or are in fact getting worse before you can make it to your follow-up appointment, please do not hesitate to call 911 and come back for further evaluation.  Merrily Brittle, MD   Dg Tibia/fibula Left  Result Date: 01/17/2018 CLINICAL DATA:  Left knee pain after falling. EXAM: LEFT TIBIA AND FIBULA - 2 VIEW COMPARISON:  None. FINDINGS: There is mild femorotibial joint space narrowing with subchondral sclerosis. No fracture or dislocation of the left tibia or fibula. The knee and ankle are approximated. IMPRESSION: Mild medial femorotibial compartment osteoarthrosis. No acute abnormality. Electronically Signed   By: Deatra Robinson M.D.   On: 01/17/2018 03:41

## 2018-01-17 NOTE — ED Triage Notes (Signed)
Patient reports tripped over car mats.  Reports having pain in left lower leg.

## 2018-01-17 NOTE — ED Provider Notes (Signed)
[facility] Emergency Department Provider Note  ____________________________________________   First MD Initiated Contact with Patient 01/17/18 [phone]     (approximate)  I have reviewed the triage vital signs and the nursing notes.   HISTORY  Chief Complaint Leg Injury and Fall   HPI Nancy Stout is a 52 y.o. female who comes to the emergency department with left leg pain after tripping over a car mats while walking this evening and falling landing onto her left shin.  She was able to get up and ambulate.  She did not hit her head.  Her pain was sudden onset severe worse when touching her leg and improved with rest.  No numbness or weakness.  She did not strike her wrists.  She has no neck pain.    Past Medical History:  Diagnosis Date  . Asthma   . COPD (chronic obstructive pulmonary disease) (HCC)   . GERD (gastroesophageal reflux disease)   . Hyperlipidemia   . Hypertension   . Low blood potassium    Due to Stomach Virus  . Low serum sodium    Due to stomach virus    Patient Active Problem List   Diagnosis Date Noted  . Osteoarthritis 08/03/2017  . Eczema of both hands 07/04/2015  . Allergic rhinitis 07/04/2015  . Polycythemia 01/22/2015  . Tobacco abuse 01/17/2015  . Asthma   . Benign hypertensive renal disease 01/11/2015  . Hyperlipidemia 01/11/2015    Past Surgical History:  Procedure Laterality Date  . CESAREAN SECTION    . INCISION AND DRAINAGE ABSCESS Left    leg  . TONSILLECTOMY      Prior to Admission medications   Medication Sig Start Date End Date Taking? Authorizing Provider  albuterol (PROAIR HFA) 108 (90 Base) MCG/ACT inhaler Inhale 2 puffs into the lungs every 4 (four) hours as needed. 08/03/17   Johnson, Megan P, DO  albuterol (PROVENTIL) (2.5 MG/3ML) 0.083% nebulizer solution USE 1 VIAL IN NEBULIZER EVERY 4 HOURS 08/03/17   Johnson, Megan P, DO  DiphenhydrAMINE HCl (ALLERGY MED PO) Take by mouth daily.    [provider]  fluticasone (FLONASE) 50 MCG/ACT nasal spray Place 2 sprays into both nostrils daily. 08/03/17   Johnson, Megan P, DO  Fluticasone-Salmeterol (ADVAIR DISKUS) 250-50 MCG/DOSE AEPB TAKE 1 PUFF BY MOUTH TWICE A DAY 08/03/17   Johnson, Megan P, DO  gabapentin (NEURONTIN) 400 MG capsule Take 1 cap in the AM and 2 caps at bedtime 08/03/17   Johnson, Megan P, DO  montelukast (SINGULAIR) 10 MG tablet Take 1 tablet (10 mg total) by mouth at bedtime. 08/03/17   Johnson, Megan P, DO  naproxen (NAPROSYN) 500 MG tablet Take 1 tablet (500 mg total) by mouth 2 (two) times daily with a meal. 08/03/17   Johnson, Megan P, DO  omeprazole (PRILOSEC) 20 MG capsule TAKE 1 CAPSULE BY MOUTH EVERY DAY 12/09/17   Johnson, Megan P, DO  pravastatin (PRAVACHOL) 40 MG tablet TAKE 1 TABLET (40 MG TOTAL) BY MOUTH DAILY. 08/03/17   Olevia Perches P, DO    Allergies Sulfa antibiotics  Family History  Problem Relation Age of Onset  . Hyperlipidemia Mother   . Hypertension Mother   . Cancer Father        Lung  . Hypertension Father   . Hyperlipidemia Sister   . Drug abuse Son     Social History Social History   Tobacco Use  . Smoking status: Current Every Day Smoker  Packs/day: 0.25    Types: Cigarettes  . Smokeless tobacco: Never Used  Substance Use Topics  . Alcohol use: Yes    Alcohol/week: 0.0 standard drinks    Comment: one or less per day  . Drug use: No    Review of Systems Constitutional: No fever/chills Cardiovascular: Denies chest pain. Respiratory: Denies shortness of breath. Gastrointestinal: No abdominal pain.  No nausea, no vomiting.   Musculoskeletal: Positive for leg pain Neurological: Negative for headaches   ____________________________________________   PHYSICAL EXAM:  VITAL SIGNS: ED Triage Vitals  Enc Vitals Group     BP 01/17/18 0214 139/78     Pulse Rate 01/17/18 0214 90     Resp 01/17/18 0214 18     Temp 01/17/18 0214 97.8 F (36.6 C)     Temp Source  01/17/18 0214 Oral     SpO2 01/17/18 0214 98 %     Weight 01/17/18 0214 205 lb (93 kg)     Height 01/17/18 0214 5\' 3"  (1.6 m)     Head Circumference --      Peak Flow --      Pain Score 01/17/18 0213 7     Pain Loc --      Pain Edu? --      Excl. in GC? --     Constitutional: Alert and oriented x4 appears quite uncomfortable although nontoxic no diaphoresis Head: Atraumatic.  Pupils midrange and brisk Neck: No midline tenderness or step-offs.   Cardiovascular: Regular rate and rhythm Respiratory: Normal respiratory effort.  No retractions. MSK: No tenderness over medial malleolus or lateral malleolus or for 6 cm proximal No tenderness over navicular, midfoot, or fifth metatarsal 2+ dorsalis pedis pulse Skin closed Compartments soft Patient can fire extensor hallucis longus, extensor digitorum longus, flexor hallucis longus, flexor digitorum longus, tibialis anterior, and gastrocnemius Sensation intact to light touch to sural, saphenous, deep peroneal, superficial peroneal, and tibial nerve Somewhat tender over mid shin Neurologic:  Normal speech and language. No gross focal neurologic deficits are appreciated.  Skin:  Skin is warm, dry and intact. No rash noted.    ____________________________________________  LABS (all labs ordered are listed, but only abnormal results are displayed)  Labs Reviewed - No data to display   __________________________________________  EKG   ____________________________________________  RADIOLOGY  X-ray of the tib-fib reviewed by me with chronic changes but no acute disease ____________________________________________   DIFFERENTIAL includes but not limited to  Contusion, hematoma, fracture, sprain   PROCEDURES  Procedure(s) performed: no  Procedures  Critical Care performed: no  ____________________________________________   INITIAL IMPRESSION / ASSESSMENT AND PLAN / ED COURSE  Pertinent labs & imaging results that  were available during my care of the patient were reviewed by me and considered in my medical decision making (see chart for details).   As part of my medical decision making, I reviewed the following data within the electronic MEDICAL RECORD NUMBER History obtained from family if available, nursing notes, old chart and ekg, as well as notes from prior ED visits.  The patient comes to the emergency department after a clear mechanical trip and fall striking her leg.  Given Tylenol with improvement in her symptoms.  X-rays negative.  She is able to ambulate.  Neurovascularly intact.  Medically stable for outpatient management.      ____________________________________________   FINAL CLINICAL IMPRESSION(S) / ED DIAGNOSES  Final diagnoses:  Contusion of left knee, initial encounter      NEW MEDICATIONS STARTED DURING  THIS VISIT:  Discharge Medication List as of 01/17/2018  4:45 AM    START taking these medications   Details  ibuprofen (ADVIL,MOTRIN) 600 MG tablet Take 1 tablet (600 mg total) by mouth every 8 (eight) hours as needed., Starting Sun 01/17/2018, Print         Note:  This document was prepared using Dragon voice recognition software and may include unintentional dictation errors.      Merrily Brittle, MD 01/19/18 [phone]

## 2018-01-17 NOTE — ED Notes (Signed)
Patient transported to X-ray 

## 2018-01-18 ENCOUNTER — Encounter: Payer: Self-pay | Admitting: Family Medicine

## 2018-01-18 ENCOUNTER — Ambulatory Visit: Payer: Medicaid Other | Admitting: Family Medicine

## 2018-01-18 VITALS — BP 147/86 | HR 83 | Temp 98.5°F | Ht 60.0 in | Wt 205.0 lb

## 2018-01-18 DIAGNOSIS — Z1239 Encounter for other screening for malignant neoplasm of breast: Secondary | ICD-10-CM

## 2018-01-18 DIAGNOSIS — S8002XD Contusion of left knee, subsequent encounter: Secondary | ICD-10-CM

## 2018-01-18 NOTE — Patient Instructions (Signed)
Follow up as needed

## 2018-01-18 NOTE — Progress Notes (Signed)
BP (!) 147/86 (BP Location: Left Arm, Patient Position: Sitting, Cuff Size: Normal)   Pulse 83   Temp 98.5 F (36.9 C)   Ht 5' (1.524 m)   Wt 205 lb (93 kg) Comment: Patient reported  LMP 08/02/2017 (Exact Date)   SpO2 97%   BMI 40.04 kg/m    Subjective:    Patient ID: Nancy Stout, female    DOB: 11-05-1965, 52 y.o.   MRN: [id number]  HPI: Nancy Stout is a 52 y.o. female  Chief Complaint  Patient presents with  . ER Follow Up    No tears or fractures   Here today for ER f/u for left knee contusion. Imaging negative for fracture during workup. States still having some pain after being still for long periods of time and then starting back moving, but sxs definitely improingTaking naproxen and tylenol, using ice packs, ace wrap, and keeping elevated. Denies swelling, redness, heat, fevers, chills.   Relevant past medical, surgical, family and social history reviewed and updated as indicated. Interim medical history since our last visit reviewed. Allergies and medications reviewed and updated.  Review of Systems  Per HPI unless specifically indicated above     Objective:    BP (!) 147/86 (BP Location: Left Arm, Patient Position: Sitting, Cuff Size: Normal)   Pulse 83   Temp 98.5 F (36.9 C)   Ht 5' (1.524 m)   Wt 205 lb (93 kg) Comment: Patient reported  LMP 08/02/2017 (Exact Date)   SpO2 97%   BMI 40.04 kg/m   Wt Readings from Last 3 Encounters:  01/18/18 205 lb (93 kg)  01/17/18 205 lb (93 kg)  09/28/17 213 lb 1 oz (96.6 kg)    Physical Exam  Constitutional: She is oriented to person, place, and time. She appears well-developed and well-nourished. No distress.  HENT:  Head: Atraumatic.  Eyes: EOM are normal.  Neck: Normal range of motion. Neck supple.  Cardiovascular: Normal rate, regular rhythm and normal heart sounds.  Pulmonary/Chest: Effort normal and breath sounds normal.  Musculoskeletal: Normal range of motion. She exhibits tenderness (left  lateral knee).  Neurological: She is alert and oriented to person, place, and time.  Skin: Skin is warm and dry.  Minimal bruising left anterior knee  Psychiatric: She has a normal mood and affect. Her behavior is normal.  Nursing note and vitals reviewed.   Results for orders placed or performed in visit on 09/28/17  Basic metabolic panel  Result Value Ref Range   Glucose 93 65 - 99 mg/dL   BUN 13 6 - 24 mg/dL   Creatinine, Ser 4.09 0.57 - 1.00 mg/dL   GFR calc non Af Amer 107 >59 mL/min/1.73   GFR calc Af Amer 123 >59 mL/min/1.73   BUN/Creatinine Ratio 22 9 - 23   Sodium 139 134 - 144 mmol/L   Potassium 5.1 3.5 - 5.2 mmol/L   Chloride 103 96 - 106 mmol/L   CO2 24 20 - 29 mmol/L   Calcium 9.2 8.7 - 10.2 mg/dL  CBC with Differential/Platelet  Result Value Ref Range   WBC 7.3 3.4 - 10.8 x10E3/uL   RBC 4.54 3.77 - 5.28 x10E6/uL   Hemoglobin 15.4 11.1 - 15.9 g/dL   Hematocrit 81.1 91.4 - 46.6 %   MCV 100 (H) 79 - 97 fL   MCH 33.9 (H) 26.6 - 33.0 pg   MCHC 34.0 31.5 - 35.7 g/dL   RDW 78.2 95.6 - 21.3 %   Platelets 267 150 -  450 x10E3/uL   Neutrophils 62 Not Estab. %   Lymphs 22 Not Estab. %   Monocytes 10 Not Estab. %   Eos 5 Not Estab. %   Basos 1 Not Estab. %   Neutrophils Absolute 4.5 1.4 - 7.0 x10E3/uL   Lymphocytes Absolute 1.6 0.7 - 3.1 x10E3/uL   Monocytes Absolute 0.7 0.1 - 0.9 x10E3/uL   EOS (ABSOLUTE) 0.4 0.0 - 0.4 x10E3/uL   Basophils Absolute 0.0 0.0 - 0.2 x10E3/uL   Immature Granulocytes 0 Not Estab. %   Immature Grans (Abs) 0.0 0.0 - 0.1 x10E3/uL      Assessment & Plan:   Problem List Items Addressed This Visit    None    Visit Diagnoses    Contusion of left knee, subsequent encounter    -  Primary   Improving, continue naproxen, tylenol, ace wrap, ice, elevation. F/u if not improving. Work note given to return to work Saturday if feeling better   Screening for breast cancer       Relevant Orders   MM Digital Diagnostic Bilat       Follow up  plan: Return if symptoms worsen or fail to improve.

## 2018-02-18 ENCOUNTER — Other Ambulatory Visit: Payer: Self-pay | Admitting: Family Medicine

## 2018-02-26 ENCOUNTER — Other Ambulatory Visit: Payer: Self-pay | Admitting: Family Medicine

## 2018-02-26 NOTE — Telephone Encounter (Signed)
Requested medication (s) are due for refill today: yes  Requested medication (s) are on the active medication list: NO discontinued (01/18/18) Error  Last refill:  08/03/17  Future visit scheduled: no  Notes to clinic:  ?    Requested Prescriptions  Pending Prescriptions Disp Refills   lisinopril (PRINIVIL,ZESTRIL) 20 MG tablet [Pharmacy Med Name: LISINOPRIL 20 MG TABLET] 30 tablet 5    Sig: TAKE 1 TABLET BY MOUTH EVERY DAY     Cardiovascular:  ACE Inhibitors Failed - 02/26/2018  8:06 AM      Failed - Last BP in normal range    BP Readings from Last 1 Encounters:  01/18/18 (!) 147/86         Passed - Cr in normal range and within 180 days    Creatinine, Ser  Date Value Ref Range Status  09/28/2017 0.58 0.57 - 1.00 mg/dL Final         Passed - K in normal range and within 180 days    Potassium  Date Value Ref Range Status  09/28/2017 5.1 3.5 - 5.2 mmol/L Final         Passed - Patient is not pregnant      Passed - Valid encounter within last 6 months    Recent Outpatient Visits          1 month ago Contusion of left knee, subsequent encounter   [facility] [place], Rachel Elizabeth, PA-C   5 months ago Benign hypertensive renal disease   [facility] [place], Megan P, DO   6 months ago Routine general medical examination at a health care facility   [facility] [place], [place], DO   8 months ago Paresthesias   [facility] [place], Megan P, DO   9 months ago Acute cystitis with hematuria   [facility] [place], [place] P, DO

## 2018-04-02 ENCOUNTER — Other Ambulatory Visit: Payer: Self-pay | Admitting: Family Medicine

## 2018-04-05 NOTE — Telephone Encounter (Signed)
Refill request for lisinopril; refill note dated 02/26/18 reads, "please call for appointment for more refills"; no upcoming visits noted; left message on voicemail [phone].

## 2018-04-09 ENCOUNTER — Other Ambulatory Visit: Payer: Self-pay | Admitting: Family Medicine

## 2018-04-15 ENCOUNTER — Other Ambulatory Visit: Payer: Self-pay | Admitting: Family Medicine

## 2018-05-06 ENCOUNTER — Other Ambulatory Visit: Payer: Self-pay | Admitting: Family Medicine

## 2018-05-07 ENCOUNTER — Other Ambulatory Visit: Payer: Self-pay | Admitting: Family Medicine

## 2018-05-08 ENCOUNTER — Other Ambulatory Visit: Payer: Self-pay | Admitting: Family Medicine

## 2018-05-10 NOTE — Telephone Encounter (Signed)
Filled until appt  Requested Prescriptions  Pending Prescriptions Disp Refills  . pravastatin (PRAVACHOL) 40 MG tablet [Pharmacy Med Name: PRAVASTATIN SODIUM 40 MG TAB] 90 tablet 0    Sig: TAKE 1 TABLET BY MOUTH EVERY DAY     Cardiovascular:  Antilipid - Statins Failed - 05/08/2018 10:08 AM      Failed - Total Cholesterol in normal range and within 360 days    Cholesterol, Total  Date Value Ref Range Status  08/03/2017 215 (H) 100 - 199 mg/dL Final   Cholesterol Piccolo, Waived  Date Value Ref Range Status  01/19/2015 221 (H) <200 mg/dL Final    Comment:                            Desirable                <200                         Borderline High      200- 239                         High                     >239          Failed - LDL in normal range and within 360 days    LDL Calculated  Date Value Ref Range Status  08/03/2017 111 (H) 0 - 99 mg/dL Final         Passed - HDL in normal range and within 360 days    HDL  Date Value Ref Range Status  08/03/2017 79 >39 mg/dL Final         Passed - Triglycerides in normal range and within 360 days    Triglycerides  Date Value Ref Range Status  08/03/2017 123 0 - 149 mg/dL Final   Triglycerides Piccolo,Waived  Date Value Ref Range Status  01/19/2015 91 <150 mg/dL Final    Comment:                            Normal                   <150                         Borderline High     150 - 199                         High                200 - 499                         Very High                >499          Passed - Patient is not pregnant      Passed - Valid encounter within last 12 months    Recent Outpatient Visits          3 months ago Contusion of left knee, subsequent encounter   [facility] [place], [place], [place]   7 months ago Benign hypertensive renal disease  [facility] [place], Megan P, DO   9 months ago Routine general medical examination at a health care facility   [facility] [place], [place] P, [place]   10 months ago Paresthesias   [facility] [place], [place], DO   1 year ago Acute cystitis with hematuria   [facility] Dorcas [place], DO      Future Appointments            In 4 days Laural Benes, Oralia Rud, DO [facility]

## 2018-05-14 ENCOUNTER — Ambulatory Visit: Payer: Medicaid Other | Admitting: Family Medicine

## 2018-05-29 ENCOUNTER — Other Ambulatory Visit: Payer: Self-pay | Admitting: Family Medicine

## 2018-05-31 NOTE — Telephone Encounter (Signed)
Called and left message for pt to call back and discuss symptoms

## 2018-06-14 ENCOUNTER — Other Ambulatory Visit: Payer: Self-pay | Admitting: Family Medicine

## 2018-06-14 NOTE — Telephone Encounter (Signed)
Requested Prescriptions  Pending Prescriptions Disp Refills  . lisinopril (PRINIVIL,ZESTRIL) 20 MG tablet [Pharmacy Med Name: LISINOPRIL 20 MG TABLET] 90 tablet 0    Sig: TAKE 1 TABLET (20 MG TOTAL) BY MOUTH DAILY. PLEASE CALL FOR APPOINTMENT FOR MORE REFILLS     Cardiovascular:  ACE Inhibitors Failed - 06/14/2018  3:08 PM      Failed - Cr in normal range and within 180 days    Creatinine, Ser  Date Value Ref Range Status  09/28/2017 0.58 0.57 - 1.00 mg/dL Final         Failed - K in normal range and within 180 days    Potassium  Date Value Ref Range Status  09/28/2017 5.1 3.5 - 5.2 mmol/L Final         Failed - Last BP in normal range    BP Readings from Last 1 Encounters:  01/18/18 (!) 147/86         Passed - Patient is not pregnant      Passed - Valid encounter within last 6 months    Recent Outpatient Visits          4 months ago Contusion of left knee, subsequent encounter   [facility] Particia Nearing, [place]   8 months ago Benign hypertensive renal disease   [facility] [place], Megan P, DO   10 months ago Routine general medical examination at a health care facility   [facility], [place], DO   12 months ago Paresthesias   [facility], Megan P, DO   1 year ago Acute cystitis with hematuria   [facility] [place], Megan P, DO

## 2018-06-25 ENCOUNTER — Other Ambulatory Visit: Payer: Self-pay | Admitting: Family Medicine

## 2018-06-25 NOTE — Telephone Encounter (Signed)
Requested Prescriptions  Pending Prescriptions Disp Refills  . gabapentin (NEURONTIN) 400 MG capsule [Pharmacy Med Name: GABAPENTIN 400 MG CAPSULE] 90 capsule 0    Sig: TAKE 1 CAP IN THE AM AND 2 CAPS AT BEDTIME     Neurology: Anticonvulsants - gabapentin Passed - 06/25/2018  8:24 AM      Passed - Valid encounter within last 12 months    Recent Outpatient Visits          5 months ago Contusion of left knee, subsequent encounter   [facility] Particia Nearing, [place]   9 months ago Benign hypertensive renal disease   [facility] [place], Megan P, DO   10 months ago Routine general medical examination at a health care facility   [facility] [place], [place], DO   1 year ago Paresthesias   [facility] [place], Megan P, DO   1 year ago Acute cystitis with hematuria   [facility] [place], Megan P, DO

## 2018-07-01 ENCOUNTER — Telehealth: Payer: Self-pay | Admitting: Family Medicine

## 2018-07-01 NOTE — Telephone Encounter (Signed)
Please write Rx for nebulizer and I can sign it tomorrow when I'm in and fax it. Dx. Asthma

## 2018-07-01 NOTE — Telephone Encounter (Signed)
Copied from CRM [id number]. Topic: Quick Communication - Rx Refill/Question >> Jul 01, 2018  9:39 AM Lyn Hollingshead, [facility] wrote: Medication: nebulizer  Pt called to request a RX for a nebulizer.  States that hers broke.  Pt states she has checked and [facility] has them  Preferred Pharmacy (with phone number or street name): [facility] [address]), [place] - [address] [phone] (Phone) [phone] (Fax)    Agent: Please be advised that RX refills may take up to 3 business days. We ask that you follow-up with your pharmacy.

## 2018-07-01 NOTE — Telephone Encounter (Signed)
RX written and placed in provider's folder for signature when she returns to office tomorrow.

## 2018-07-10 ENCOUNTER — Other Ambulatory Visit: Payer: Self-pay | Admitting: Family Medicine

## 2018-08-02 ENCOUNTER — Other Ambulatory Visit: Payer: Self-pay | Admitting: Family Medicine

## 2018-08-06 ENCOUNTER — Other Ambulatory Visit: Payer: Self-pay | Admitting: Family Medicine

## 2018-08-06 NOTE — Telephone Encounter (Signed)
Needs follow-up

## 2018-08-06 NOTE — Telephone Encounter (Signed)
Called pt to schedule, phone number is not active

## 2018-08-06 NOTE — Telephone Encounter (Signed)
Requested medication (s) are due for refill today: yes  Requested medication (s) are on the active medication list: yes  Last refill: 05/10/2018  Future visit scheduled: No  Notes to clinic:  Labs are overdue    Requested Prescriptions  Pending Prescriptions Disp Refills   pravastatin (PRAVACHOL) 40 MG tablet [Pharmacy Med Name: PRAVASTATIN SODIUM 40 MG TAB] 90 tablet 0    Sig: TAKE 1 TABLET BY MOUTH EVERY DAY     Cardiovascular:  Antilipid - Statins Failed - 08/06/2018 10:03 AM      Failed - Total Cholesterol in normal range and within 360 days    Cholesterol, Total  Date Value Ref Range Status  08/03/2017 215 (H) 100 - 199 mg/dL Final   Cholesterol Piccolo, Waived  Date Value Ref Range Status  01/19/2015 221 (H) <200 mg/dL Final    Comment:                            Desirable                <200                         Borderline High      200- 239                         High                     >239          Failed - LDL in normal range and within 360 days    LDL Calculated  Date Value Ref Range Status  08/03/2017 111 (H) 0 - 99 mg/dL Final         Failed - HDL in normal range and within 360 days    HDL  Date Value Ref Range Status  08/03/2017 79 >39 mg/dL Final         Failed - Triglycerides in normal range and within 360 days    Triglycerides  Date Value Ref Range Status  08/03/2017 123 0 - 149 mg/dL Final   Triglycerides Piccolo,Waived  Date Value Ref Range Status  01/19/2015 91 <150 mg/dL Final    Comment:                            Normal                   <150                         Borderline High     150 - 199                         High                200 - 499                         Very High                >499          Passed - Patient is not pregnant      Passed - Valid encounter within last 12 months    Recent Outpatient Visits  6 months ago Contusion of left knee, subsequent encounter   [facility] [place],  [place], [place]   10 months ago Benign hypertensive renal disease   [facility] [place], [place], DO   1 year ago Routine general medical examination at a health care facility   [facility] [place], [place], [place]   1 year ago Paresthesias   [facility] [place], [place], [place]   1 year ago Acute cystitis with hematuria   [facility] [place], [place] P, [place]

## 2018-08-21 ENCOUNTER — Other Ambulatory Visit: Payer: Self-pay | Admitting: Family Medicine

## 2018-08-21 NOTE — Telephone Encounter (Signed)
Requested Prescriptions  Pending Prescriptions Disp Refills  . Fluticasone-Salmeterol (ADVAIR DISKUS) 250-50 MCG/DOSE AEPB [Pharmacy Med Name: ADVAIR 250-50 DISKUS] 60 each 6    Sig: TAKE 1 PUFF BY MOUTH TWICE A DAY     Pulmonology:  Combination Products Passed - 08/21/2018  9:09 AM      Passed - Valid encounter within last 12 months    Recent Outpatient Visits          7 months ago Contusion of left knee, subsequent encounter   [facility] Particia [place], [place]   10 months ago Benign hypertensive renal disease   [facility] [place], Megan P, DO   1 year ago Routine general medical examination at a health care facility   [facility] [place], [place] P, DO   1 year ago Paresthesias   [facility] [place], Megan P, DO   1 year ago Acute cystitis with hematuria   [facility] [place], Megan P, DO

## 2018-08-28 ENCOUNTER — Other Ambulatory Visit: Payer: Self-pay | Admitting: Family Medicine

## 2018-08-28 NOTE — Telephone Encounter (Signed)
Requested medication (s) are due for refill today:  Yes  Requested medication (s) are on the active medication list:  Yes  Future visit scheduled:  No  Last Refill: 05/31/18; #180; no refills  Note to clinic: attempted to call pt. To discuss symptoms and continued need for medication;  Phone # [phone] has been changed or disconnected.   Requested Prescriptions  Pending Prescriptions Disp Refills   naproxen (NAPROSYN) 500 MG tablet [Pharmacy Med Name: NAPROXEN 500 MG TABLET] 180 tablet 0    Sig: TAKE 1 TABLET BY MOUTH 2 (TWO) TIMES DAILY WITH A MEAL.     Analgesics:  NSAIDS Passed - 08/28/2018 10:18 AM      Passed - Cr in normal range and within 360 days    Creatinine, Ser  Date Value Ref Range Status  09/28/2017 0.58 0.57 - 1.00 mg/dL Final         Passed - HGB in normal range and within 360 days    Hemoglobin  Date Value Ref Range Status  09/28/2017 15.4 11.1 - 15.9 g/dL Final         Passed - Patient is not pregnant      Passed - Valid encounter within last 12 months    Recent Outpatient Visits          7 months ago Contusion of left knee, subsequent encounter   [facility] Roosvelt Maser [place], [place]   11 months ago Benign hypertensive renal disease   [facility] [place], Megan P, DO   1 year ago Routine general medical examination at a health care facility   [facility] [place], [place] P, DO   1 year ago Paresthesias   [facility] [place], Megan P, DO   1 year ago Acute cystitis with hematuria   [facility] [place], Megan P, DO

## 2018-09-01 NOTE — Telephone Encounter (Signed)
Phone number in chart is disconnected. Will mail letter.

## 2018-09-05 ENCOUNTER — Telehealth: Payer: Self-pay | Admitting: Family Medicine

## 2018-09-05 NOTE — Telephone Encounter (Signed)
Pt overdue for OV, Courtesy refill has already been given. Pt needs virtual visit.

## 2018-09-07 ENCOUNTER — Encounter: Payer: Self-pay | Admitting: Family Medicine

## 2018-09-07 NOTE — Telephone Encounter (Signed)
Called pt line busy, letter printed to mail to pt.

## 2018-09-10 ENCOUNTER — Other Ambulatory Visit: Payer: Self-pay | Admitting: Family Medicine

## 2018-09-10 NOTE — Telephone Encounter (Signed)
Called to schedule appt, line busy.

## 2018-09-10 NOTE — Telephone Encounter (Signed)
Refill request for lisinopril; last refill 06/14/2018; pharmacy note states, "PLEASE CALL FOR APPOINTMENT FOR MORE REFILLS"; attempted to contact pt; unable to leave message; will route to office for final disposition.

## 2018-09-13 ENCOUNTER — Other Ambulatory Visit: Payer: Self-pay

## 2018-09-13 NOTE — Telephone Encounter (Signed)
[facility]ed a [facility] refill request on Pravastatin sodium 40 mg

## 2018-09-14 NOTE — Telephone Encounter (Signed)
Tried to call patient. Unable to leave VM. Phone sounds like it's busy.

## 2018-09-15 NOTE — Telephone Encounter (Signed)
Called patient. No answer. Unable to leave VM. Line does not ring at all, sounds like it is busy.

## 2018-09-16 NOTE — Telephone Encounter (Signed)
Tried to call patient. Busy line. Unable to leave a voice message.

## 2018-09-25 ENCOUNTER — Other Ambulatory Visit: Payer: Self-pay | Admitting: Family Medicine

## 2018-10-02 ENCOUNTER — Other Ambulatory Visit: Payer: Self-pay | Admitting: Family Medicine

## 2018-10-02 NOTE — Telephone Encounter (Signed)
Pt needs to schedule appt for further- see letter sent out to pt in chart

## 2018-10-16 ENCOUNTER — Other Ambulatory Visit: Payer: Self-pay | Admitting: Family Medicine

## 2018-10-19 ENCOUNTER — Other Ambulatory Visit: Payer: Self-pay

## 2018-10-19 MED ORDER — PRAVASTATIN SODIUM 40 MG PO TABS: ORAL_TABLET | ORAL | 0 refills | Status: DC

## 2018-10-19 MED ORDER — NAPROXEN 500 MG PO TABS: ORAL_TABLET | ORAL | 0 refills | Status: DC

## 2018-10-19 MED ORDER — LISINOPRIL 20 MG PO TABS: 20.0000 mg | ORAL_TABLET | Freq: Every day | ORAL | 0 refills | Status: DC

## 2018-10-19 MED ORDER — OMEPRAZOLE 20 MG PO CPDR: DELAYED_RELEASE_CAPSULE | ORAL | 0 refills | Status: DC

## 2018-10-19 MED ORDER — MONTELUKAST SODIUM 10 MG PO TABS: 10.0000 mg | ORAL_TABLET | Freq: Every day | ORAL | 0 refills | Status: DC

## 2018-10-19 MED ORDER — FLUTICASONE PROPIONATE 50 MCG/ACT NA SUSP: 2.0000 | Freq: Every day | NASAL | 6 refills | Status: DC

## 2018-10-19 NOTE — Telephone Encounter (Signed)
Refilled with 6 refills 07/10/18- should already be at the pharmacy.

## 2018-10-19 NOTE — Telephone Encounter (Signed)
Pt still needs PROAIR HFA 108 (90 Base) MCG/ACT inhaler  Sent to the pharmacy/ lease advise

## 2018-10-19 NOTE — Telephone Encounter (Signed)
Tried calling patient twice, got a busy signal both times. Will try to call again tomorrow.

## 2018-10-19 NOTE — Telephone Encounter (Signed)
Patient has appointment coming up on 11/01/18.

## 2018-10-19 NOTE — Telephone Encounter (Signed)
Called and spoke to the pharmacy. They state that the way we have written the RX that it is only lasting for 18 days based on the dispense amount, directions, and refills. Pharmacy states that in order for it to last for 30 days, we need to write for 2 inhalers or 17 grams.   Will call patient to see if she has enough medication to get to her appointment.

## 2018-10-20 NOTE — Telephone Encounter (Signed)
Attempted to reach pt. Got busy signal.

## 2018-10-20 NOTE — Telephone Encounter (Signed)
Tried calling patient again, still got a busy signal.

## 2018-10-21 NOTE — Telephone Encounter (Signed)
Called patient,busy signal.

## 2018-10-21 NOTE — Telephone Encounter (Signed)
Will close encounter at this time due to inability to reach pt.

## 2018-10-23 ENCOUNTER — Other Ambulatory Visit: Payer: Self-pay | Admitting: Family Medicine

## 2018-10-23 NOTE — Telephone Encounter (Signed)
Requested Prescriptions  Pending Prescriptions Disp Refills  . PROAIR HFA 108 (90 Base) MCG/ACT inhaler [Pharmacy Med Name: PROAIR HFA 90 MCG INHALER] 8.5 g 0    Sig: TAKE 2 PUFFS BY MOUTH EVERY 4 HOURS AS NEEDED     Pulmonology:  Beta Agonists Failed - 10/23/2018 10:12 AM      Failed - One inhaler should last at least one month. If the patient is requesting refills earlier, contact the patient to check for uncontrolled symptoms.      Passed - Valid encounter within last 12 months    Recent Outpatient Visits          9 months ago Contusion of left knee, subsequent encounter   [facility] [place], [place]   1 year ago Benign hypertensive renal disease   [facility] [place], Megan P, DO   1 year ago Routine general medical examination at a health care facility   [place], [place], DO   1 year ago [place], [place], DO   1 year ago Acute cystitis with hematuria   [facility] Valerie Roys, DO      Future Appointments            In 1 week Wynetta Emery, Barb Merino, DO [facility]

## 2018-11-01 ENCOUNTER — Other Ambulatory Visit: Payer: Self-pay

## 2018-11-01 ENCOUNTER — Ambulatory Visit (INDEPENDENT_AMBULATORY_CARE_PROVIDER_SITE_OTHER): Payer: Medicaid Other | Admitting: Family Medicine

## 2018-11-01 ENCOUNTER — Encounter: Payer: Self-pay | Admitting: Family Medicine

## 2018-11-01 VITALS — Ht 60.0 in

## 2018-11-01 DIAGNOSIS — E782 Mixed hyperlipidemia: Secondary | ICD-10-CM

## 2018-11-01 DIAGNOSIS — Z720 Tobacco use: Secondary | ICD-10-CM

## 2018-11-01 DIAGNOSIS — J4540 Moderate persistent asthma, uncomplicated: Secondary | ICD-10-CM

## 2018-11-01 DIAGNOSIS — I129 Hypertensive chronic kidney disease with stage 1 through stage 4 chronic kidney disease, or unspecified chronic kidney disease: Secondary | ICD-10-CM

## 2018-11-01 DIAGNOSIS — E6609 Other obesity due to excess calories: Secondary | ICD-10-CM

## 2018-11-01 MED ORDER — OMEPRAZOLE 20 MG PO CPDR: DELAYED_RELEASE_CAPSULE | ORAL | 0 refills | Status: DC

## 2018-11-01 MED ORDER — LISINOPRIL 20 MG PO TABS: 20.0000 mg | ORAL_TABLET | Freq: Every day | ORAL | 0 refills | Status: DC

## 2018-11-01 MED ORDER — FLUTICASONE-SALMETEROL 250-50 MCG/DOSE IN AEPB: INHALATION_SPRAY | RESPIRATORY_TRACT | 6 refills | Status: DC

## 2018-11-01 MED ORDER — MONTELUKAST SODIUM 10 MG PO TABS: 10.0000 mg | ORAL_TABLET | Freq: Every day | ORAL | 0 refills | Status: DC

## 2018-11-01 MED ORDER — ALBUTEROL SULFATE HFA 108 (90 BASE) MCG/ACT IN AERS: INHALATION_SPRAY | RESPIRATORY_TRACT | 3 refills | Status: DC

## 2018-11-01 MED ORDER — PRAVASTATIN SODIUM 40 MG PO TABS: ORAL_TABLET | ORAL | 0 refills | Status: DC

## 2018-11-01 MED ORDER — NAPROXEN 500 MG PO TABS: ORAL_TABLET | ORAL | 0 refills | Status: DC

## 2018-11-01 MED ORDER — NICOTINE 21 MG/24HR TD PT24: 21.0000 mg | MEDICATED_PATCH | Freq: Every day | TRANSDERMAL | 3 refills | Status: DC

## 2018-11-01 MED ORDER — FLUTICASONE PROPIONATE 50 MCG/ACT NA SUSP: 2.0000 | Freq: Every day | NASAL | 6 refills | Status: DC

## 2018-11-01 MED ORDER — GABAPENTIN 400 MG PO CAPS: 400.0000 mg | ORAL_CAPSULE | Freq: Three times a day (TID) | ORAL | 3 refills | Status: DC

## 2018-11-01 MED ORDER — ALBUTEROL SULFATE (2.5 MG/3ML) 0.083% IN NEBU: 2.5000 mg | INHALATION_SOLUTION | RESPIRATORY_TRACT | 6 refills | Status: DC | PRN

## 2018-11-01 NOTE — Assessment & Plan Note (Signed)
Unclear- has not been in. Will get her in for BP check and labs ASAP. Adjust medicine as needed.

## 2018-11-01 NOTE — Assessment & Plan Note (Signed)
Under good control on current regimen. Continue current regimen. Continue to monitor. Call with any concerns. Refills given. To have labs done ASAP.  

## 2018-11-01 NOTE — Assessment & Plan Note (Signed)
Would like to quit. Smoking 1ppd- will start patches. Follow up 1 month.

## 2018-11-01 NOTE — Assessment & Plan Note (Signed)
Under good control on current regimen. Continue current regimen. Continue to monitor. Call with any concerns. Refills given. To have labs done ASAP.

## 2018-11-01 NOTE — Progress Notes (Signed)
Ht 5' (1.524 m)   LMP 08/02/2017 (Exact Date)   BMI 40.04 kg/m    Subjective:    Patient ID: Nancy SilenceBrenda Bivens, female    DOB: March 11, 1966, 53 y.o.   MRN: [id number]  HPI: Nancy Stout is a 53 y.o. female  Chief Complaint  Patient presents with  . Hypertension  . Hyperlipidemia   HYPERTENSION / HYPERLIPIDEMIA Satisfied with current treatment? yes Duration of hypertension: chronic BP monitoring frequency: not checking BP medication side effects: no Past BP meds: lisinopril Duration of hyperlipidemia: chronic Cholesterol medication side effects: no Cholesterol supplements: none Past cholesterol medications: pravastatin Medication compliance: excellent compliance Aspirin: no Recent stressors: no Recurrent headaches: no Visual changes: no Palpitations: no Dyspnea: no Chest pain: no Lower extremity edema: no Dizzy/lightheaded: no  ASTHMA Asthma status: stable Satisfied with current treatment?: yes Albuterol/rescue inhaler frequency: daily Dyspnea frequency: 1x every 2 weeks Wheezing frequency: rarely Cough frequency: coughing in the AM Nocturnal symptom frequency: rarely  Limitation of activity: yes Current upper respiratory symptoms: no Triggers: heat Aerochamber/spacer use: no Visits to ER or Urgent Care in past year: no Pneumovax: Up to Date Influenza: Up to Date  SMOKING CESSATION Smoking Status: current every day smoker Smoking Amount: 1ppd Smoking Onset: 16-17yo Smoking Quit Date: not set  Smoking triggers: boredom, eating, driving Type of tobacco use: cigarette Children in the house: no Other household members who smoke: no Treatments attempted: patches Pneumovax: UTD   Relevant past medical, surgical, family and social history reviewed and updated as indicated. Interim medical history since our last visit reviewed. Allergies and medications reviewed and updated.  Review of Systems  Constitutional: Negative.   HENT: Negative.    Respiratory: Positive for cough and shortness of breath. Negative for apnea, choking, chest tightness, wheezing and stridor.   Cardiovascular: Negative.   Gastrointestinal: Negative.   Psychiatric/Behavioral: Negative.     Per HPI unless specifically indicated above     Objective:    Ht 5' (1.524 m)   LMP 08/02/2017 (Exact Date)   BMI 40.04 kg/m   Wt Readings from Last 3 Encounters:  01/18/18 205 lb (93 kg)  01/17/18 205 lb (93 kg)  09/28/17 213 lb 1 oz (96.6 kg)    Physical Exam Vitals signs and nursing note reviewed.  Constitutional:      General: She is not in acute distress.    Appearance: Normal appearance. She is not ill-appearing, toxic-appearing or diaphoretic.  HENT:     Head: Normocephalic and atraumatic.     Right Ear: External ear normal.     Left Ear: External ear normal.     Nose: Nose normal.     Mouth/Throat:     Mouth: Mucous membranes are moist.     Pharynx: Oropharynx is clear.  Eyes:     General: No scleral icterus.       Right eye: No discharge.        Left eye: No discharge.     Conjunctiva/sclera: Conjunctivae normal.     Pupils: Pupils are equal, round, and reactive to light.  Neck:     Musculoskeletal: Normal range of motion.  Pulmonary:     Effort: Pulmonary effort is normal. No respiratory distress.     Comments: Speaking in full sentences Musculoskeletal: Normal range of motion.  Skin:    Coloration: Skin is not jaundiced or pale.     Findings: No bruising, erythema, lesion or rash.  Neurological:     Mental Status: She is alert and oriented  to person, place, and time. Mental status is at baseline.  Psychiatric:        Mood and Affect: Mood normal.        Behavior: Behavior normal.        Thought Content: Thought content normal.        Judgment: Judgment normal.     Results for orders placed or performed in visit on 09/28/17  Basic metabolic panel  Result Value Ref Range   Glucose 93 65 - 99 mg/dL   BUN 13 6 - 24 mg/dL    Creatinine, Ser 1.610.58 0.57 - 1.00 mg/dL   GFR calc non Af Amer 107 >59 mL/min/1.73   GFR calc Af Amer 123 >59 mL/min/1.73   BUN/Creatinine Ratio 22 9 - 23   Sodium 139 134 - 144 mmol/L   Potassium 5.1 3.5 - 5.2 mmol/L   Chloride 103 96 - 106 mmol/L   CO2 24 20 - 29 mmol/L   Calcium 9.2 8.7 - 10.2 mg/dL  CBC with Differential/Platelet  Result Value Ref Range   WBC 7.3 3.4 - 10.8 x10E3/uL   RBC 4.54 3.77 - 5.28 x10E6/uL   Hemoglobin 15.4 11.1 - 15.9 g/dL   Hematocrit 09.645.3 04.534.0 - 46.6 %   MCV 100 (H) 79 - 97 fL   MCH 33.9 (H) 26.6 - 33.0 pg   MCHC 34.0 31.5 - 35.7 g/dL   RDW 40.913.9 81.112.3 - 91.415.4 %   Platelets 267 150 - 450 x10E3/uL   Neutrophils 62 Not Estab. %   Lymphs 22 Not Estab. %   Monocytes 10 Not Estab. %   Eos 5 Not Estab. %   Basos 1 Not Estab. %   Neutrophils Absolute 4.5 1.4 - 7.0 x10E3/uL   Lymphocytes Absolute 1.6 0.7 - 3.1 x10E3/uL   Monocytes Absolute 0.7 0.1 - 0.9 x10E3/uL   EOS (ABSOLUTE) 0.4 0.0 - 0.4 x10E3/uL   Basophils Absolute 0.0 0.0 - 0.2 x10E3/uL   Immature Granulocytes 0 Not Estab. %   Immature Grans (Abs) 0.0 0.0 - 0.1 x10E3/uL      Assessment & Plan:   Problem List Items Addressed This Visit      Respiratory   Asthma    Under good control on current regimen. Continue current regimen. Continue to monitor. Call with any concerns. Refills given. To have labs done ASAP.       Relevant Medications   albuterol (PROVENTIL) (2.5 MG/3ML) 0.083% nebulizer solution   Fluticasone-Salmeterol (ADVAIR DISKUS) 250-50 MCG/DOSE AEPB   montelukast (SINGULAIR) 10 MG tablet   albuterol (PROAIR HFA) 108 (90 Base) MCG/ACT inhaler   Other Relevant Orders   CBC with Differential/Platelet   Comprehensive metabolic panel   TSH   UA/M w/rflx Culture, Routine     Genitourinary   Benign hypertensive renal disease    Unclear- has not been in. Will get her in for BP check and labs ASAP. Adjust medicine as needed.       Relevant Orders   CBC with Differential/Platelet    Comprehensive metabolic panel   Microalbumin, Urine Waived   TSH   UA/M w/rflx Culture, Routine     Other   Hyperlipidemia    Under good control on current regimen. Continue current regimen. Continue to monitor. Call with any concerns. Refills given. To have labs done ASAP.       Relevant Medications   lisinopril (ZESTRIL) 20 MG tablet   pravastatin (PRAVACHOL) 40 MG tablet   Other Relevant Orders   CBC  with Differential/Platelet   Comprehensive metabolic panel   Lipid Panel w/o Chol/HDL Ratio   TSH   UA/M w/rflx Culture, Routine   Tobacco abuse    Would like to quit. Smoking 1ppd- will start patches. Follow up 1 month.       Relevant Orders   CBC with Differential/Platelet   Comprehensive metabolic panel   TSH   UA/M w/rflx Culture, Routine    Other Visit Diagnoses    Obesity due to excess calories with serious comorbidity, unspecified classification    -  Primary   Relevant Orders   CBC with Differential/Platelet   Bayer DCA Hb A1c Waived   Comprehensive metabolic panel   TSH   UA/M w/rflx Culture, Routine       Follow up plan: Return in about 4 weeks (around 11/29/2018) for Physical.   . This visit was completed via FaceTime due to the restrictions of the COVID-19 pandemic. All issues as above were discussed and addressed. Physical exam was done as above through visual confirmation on FaceTime. If it was felt that the patient should be evaluated in the office, they were directed there. The patient verbally consented to this visit. . Location of the patient: home . Location of the provider: work . Those involved with this call:  . Provider: Park Liter, DO . CMA: Lesle Chris, [place] . Front Desk/Registration: Don Perking  . Time spent on call: 25 minutes with patient face to face via video conference. More than 50% of this time was spent in counseling and coordination of care. 40 minutes total spent in review of patient's record and preparation of their  chart.

## 2018-11-04 ENCOUNTER — Telehealth: Payer: Self-pay | Admitting: Family Medicine

## 2018-11-04 NOTE — Telephone Encounter (Signed)
Called pt back, no answer busy signal

## 2018-11-04 NOTE — Telephone Encounter (Signed)
Patient had a virtual appt on Mon 11/01/2018 and she stated that the provider said someone would be calling her because she needs blood work and BP check and Urine, but no one has called her.

## 2018-11-09 ENCOUNTER — Other Ambulatory Visit: Payer: Medicaid Other

## 2018-11-09 ENCOUNTER — Other Ambulatory Visit: Payer: Self-pay

## 2018-11-09 DIAGNOSIS — E6609 Other obesity due to excess calories: Secondary | ICD-10-CM

## 2018-11-09 DIAGNOSIS — I129 Hypertensive chronic kidney disease with stage 1 through stage 4 chronic kidney disease, or unspecified chronic kidney disease: Secondary | ICD-10-CM | POA: Diagnosis not present

## 2018-11-09 DIAGNOSIS — Z720 Tobacco use: Secondary | ICD-10-CM | POA: Diagnosis not present

## 2018-11-09 DIAGNOSIS — E782 Mixed hyperlipidemia: Secondary | ICD-10-CM

## 2018-11-09 DIAGNOSIS — J4540 Moderate persistent asthma, uncomplicated: Secondary | ICD-10-CM

## 2018-11-09 LAB — BAYER DCA HB A1C WAIVED: HB A1C (BAYER DCA - WAIVED): 5.5 % (ref <7.0)

## 2018-11-09 LAB — UA/M W/RFLX CULTURE, ROUTINE
Bilirubin, UA: NEGATIVE
Glucose, UA: NEGATIVE
Ketones, UA: NEGATIVE
Leukocytes,UA: NEGATIVE
Nitrite, UA: NEGATIVE
Protein,UA: NEGATIVE
RBC, UA: NEGATIVE
Specific Gravity, UA: 1.025 (ref 1.005–1.030)
Urobilinogen, Ur: 0.2 mg/dL (ref 0.2–1.0)
pH, UA: 5 (ref 5.0–7.5)

## 2018-11-09 LAB — MICROALBUMIN, URINE WAIVED
Creatinine, Urine Waived: 100 mg/dL (ref 10–300)
Microalb, Ur Waived: 30 mg/L — ABNORMAL HIGH (ref 0–19)
Microalb/Creat Ratio: <30 mg/g (ref <30)

## 2018-11-09 NOTE — Telephone Encounter (Signed)
Patient came in today for labs and obtained patient's vitals  BP: 120/78 HR: 93 Temp: 98.3 O2: 98%

## 2018-11-10 LAB — COMPREHENSIVE METABOLIC PANEL
ALT: 14 IU/L (ref 0–32)
AST: 19 IU/L (ref 0–40)
Albumin/Globulin Ratio: 1.8 (ref 1.2–2.2)
Albumin: 4.2 g/dL (ref 3.8–4.9)
Alkaline Phosphatase: 113 IU/L (ref 39–117)
BUN/Creatinine Ratio: 22 (ref 9–23)
BUN: 15 mg/dL (ref 6–24)
Bilirubin Total: 0.3 mg/dL (ref 0.0–1.2)
CO2: 21 mmol/L (ref 20–29)
Calcium: 9.1 mg/dL (ref 8.7–10.2)
Chloride: 99 mmol/L (ref 96–106)
Creatinine, Ser: 0.69 mg/dL (ref 0.57–1.00)
GFR calc Af Amer: 116 mL/min/{1.73_m2} (ref >59)
GFR calc non Af Amer: 100 mL/min/{1.73_m2} (ref >59)
Globulin, Total: 2.3 g/dL (ref 1.5–4.5)
Glucose: 98 mg/dL (ref 65–99)
Potassium: 4.9 mmol/L (ref 3.5–5.2)
Sodium: 137 mmol/L (ref 134–144)
Total Protein: 6.5 g/dL (ref 6.0–8.5)

## 2018-11-10 LAB — LIPID PANEL W/O CHOL/HDL RATIO
Cholesterol, Total: 186 mg/dL (ref 100–199)
HDL: 78 mg/dL (ref >39)
LDL Calculated: 93 mg/dL (ref 0–99)
Triglycerides: 73 mg/dL (ref 0–149)
VLDL Cholesterol Cal: 15 mg/dL (ref 5–40)

## 2018-11-10 LAB — CBC WITH DIFFERENTIAL/PLATELET
Basophils Absolute: 0.1 10*3/uL (ref 0.0–0.2)
Basos: 1 %
EOS (ABSOLUTE): 0.3 10*3/uL (ref 0.0–0.4)
Eos: 4 %
Hematocrit: 44.1 % (ref 34.0–46.6)
Hemoglobin: 15.3 g/dL (ref 11.1–15.9)
Immature Grans (Abs): 0 10*3/uL (ref 0.0–0.1)
Immature Granulocytes: 0 %
Lymphocytes Absolute: 1.9 10*3/uL (ref 0.7–3.1)
Lymphs: 28 %
MCH: 32.4 pg (ref 26.6–33.0)
MCHC: 34.7 g/dL (ref 31.5–35.7)
MCV: 93 fL (ref 79–97)
Monocytes Absolute: 0.5 10*3/uL (ref 0.1–0.9)
Monocytes: 7 %
Neutrophils Absolute: 4.1 10*3/uL (ref 1.4–7.0)
Neutrophils: 60 %
Platelets: 259 10*3/uL (ref 150–450)
RBC: 4.72 x10E6/uL (ref 3.77–5.28)
RDW: 12.9 % (ref 11.7–15.4)
WBC: 6.8 10*3/uL (ref 3.4–10.8)

## 2018-11-10 LAB — TSH: TSH: 1.42 u[IU]/mL (ref 0.450–4.500)

## 2018-11-12 ENCOUNTER — Other Ambulatory Visit: Payer: Self-pay | Admitting: Family Medicine

## 2018-11-12 MED ORDER — LISINOPRIL 20 MG PO TABS: 20.0000 mg | ORAL_TABLET | Freq: Every day | ORAL | 1 refills | Status: DC

## 2018-11-12 MED ORDER — OMEPRAZOLE 20 MG PO CPDR: DELAYED_RELEASE_CAPSULE | ORAL | 1 refills | Status: DC

## 2018-11-12 MED ORDER — PRAVASTATIN SODIUM 40 MG PO TABS: ORAL_TABLET | ORAL | 1 refills | Status: DC

## 2018-11-12 MED ORDER — MONTELUKAST SODIUM 10 MG PO TABS: 10.0000 mg | ORAL_TABLET | Freq: Every day | ORAL | 1 refills | Status: DC

## 2018-11-12 MED ORDER — NAPROXEN 500 MG PO TABS: ORAL_TABLET | ORAL | 1 refills | Status: DC

## 2018-11-12 NOTE — Telephone Encounter (Signed)
Called and left patient a VM asking for her to please return my call.  

## 2018-11-12 NOTE — Telephone Encounter (Signed)
Pt returning call to office. Please call pt °

## 2018-11-12 NOTE — Telephone Encounter (Signed)
Please let her know that everything looks nice and normal. I've sent refills to her pharmacy. Thanks!

## 2018-11-12 NOTE — Telephone Encounter (Signed)
Pt.notified

## 2018-12-22 ENCOUNTER — Ambulatory Visit: Payer: Medicaid Other | Admitting: Family Medicine

## 2018-12-30 ENCOUNTER — Telehealth: Payer: Self-pay | Admitting: Family Medicine

## 2018-12-30 NOTE — Telephone Encounter (Signed)
Requested medication (s) are due for refill today: no  Requested medication (s) are on the active medication list: yes  Last refill:  12/19/2018  Future visit scheduled: yes  Notes to clinic:    One inhaler should last at least one month. If the patient is requesting refills earlier      Requested Prescriptions  Pending Prescriptions Disp Refills   albuterol (VENTOLIN HFA) 108 (90 Base) MCG/ACT inhaler [Pharmacy Med Name: ALBUTEROL HFA (PROAIR) INHALER]  3    Sig: INHALE 2 PUFFS BY MOUTH EVERY 4 HOURS AS NEEDED     Pulmonology:  Beta Agonists Failed - 12/30/2018  2:14 PM      Failed - One inhaler should last at least one month. If the patient is requesting refills earlier, contact the patient to check for uncontrolled symptoms.      Passed - Valid encounter within last 12 months    Recent Outpatient Visits          1 month ago Obesity due to excess calories with serious comorbidity, unspecified classification   [facility] [place], Megan P, DO   11 months ago Contusion of left knee, subsequent encounter   [facility] [place]ney [place], [place]   1 year ago Benign hypertensive renal disease   [facility] [place], Megan P, DO   1 year ago Routine general medical examination at a health care facility   [facility], [place], DO   1 year ago [place], [place], DO      Future Appointments            In 2 months Wynetta Emery, Barb Merino, DO [facility]

## 2018-12-30 NOTE — Telephone Encounter (Signed)
If patient has gone through 4 inhalers in 1 month, she needs an appointment.

## 2018-12-31 MED ORDER — ALBUTEROL SULFATE HFA 108 (90 BASE) MCG/ACT IN AERS: INHALATION_SPRAY | RESPIRATORY_TRACT | 0 refills | Status: DC

## 2018-12-31 NOTE — Telephone Encounter (Signed)
Called pt she is schedule for 01/04/19, but she would like to have prescription sent to pharmacy today. Please advise.

## 2018-12-31 NOTE — Addendum Note (Signed)
Addended by: Valerie Roys on: 12/31/2018 11:03 AM   Modules accepted: Orders

## 2018-12-31 NOTE — Telephone Encounter (Signed)
Patient called back about her refill on the inhalers states that she use it every few hours as prescribed and now with the change in the weather it is needed more than ever. Please call patient at Ph# [phone] with available appointment

## 2019-01-04 ENCOUNTER — Encounter: Payer: Self-pay | Admitting: Family Medicine

## 2019-01-04 ENCOUNTER — Other Ambulatory Visit: Payer: Self-pay

## 2019-01-04 ENCOUNTER — Ambulatory Visit (INDEPENDENT_AMBULATORY_CARE_PROVIDER_SITE_OTHER): Payer: Medicaid Other | Admitting: Family Medicine

## 2019-01-04 VITALS — BP 146/88 | HR 85 | Temp 98.8°F | Ht 61.0 in | Wt 209.0 lb

## 2019-01-04 DIAGNOSIS — J410 Simple chronic bronchitis: Secondary | ICD-10-CM | POA: Diagnosis not present

## 2019-01-04 DIAGNOSIS — J4541 Moderate persistent asthma with (acute) exacerbation: Secondary | ICD-10-CM

## 2019-01-04 DIAGNOSIS — J449 Chronic obstructive pulmonary disease, unspecified: Secondary | ICD-10-CM | POA: Insufficient documentation

## 2019-01-04 MED ORDER — PREDNISONE 10 MG PO TABS: ORAL_TABLET | ORAL | 0 refills | Status: DC

## 2019-01-04 MED ORDER — AZITHROMYCIN 250 MG PO TABS: ORAL_TABLET | ORAL | 0 refills | Status: DC

## 2019-01-04 NOTE — Assessment & Plan Note (Signed)
In exacerbation. Will treat with azithromycin and prednisone taper and start trelegy. Recheck 10 days. Call if not getting better or getting worse.

## 2019-01-04 NOTE — Assessment & Plan Note (Signed)
In exacerbation. Will start trelegy- will treat with prednisone and azithromycin. Recheck 10 days.

## 2019-01-04 NOTE — Progress Notes (Signed)
BP (!) 146/88   Pulse 85   Temp 98.8 F (37.1 C) (Oral)   Ht 5\' 1"  (1.549 m)   Wt 209 lb (94.8 kg)   LMP 08/02/2017 (Exact Date)   SpO2 95%   BMI 39.49 kg/m    Subjective:    Patient ID: Nancy Stout, female    DOB: 1965-05-30, 53 y.o.   MRN: [id number]  HPI: Nancy SilenceBrenda Martis is a 53 y.o. female  Chief Complaint  Patient presents with  . Asthma   ASTHMA Asthma status: exacerbated Satisfied with current treatment?: no Albuterol/rescue inhaler frequency: several times a day- about every 2 hours Dyspnea frequency: several times a day Wheezing frequency: constant Cough frequency: several times a day Nocturnal symptom frequency: nightly Limitation of activity: yes Current upper respiratory symptoms: yes Triggers: Allergies, change in weather Last Spirometry: today Failed/intolerant to following asthma meds:  Asthma meds in past:  Aerochamber/spacer use: no Visits to ER or Urgent Care in past year: yes Pneumovax: Up to Date Influenza: Declined- considering  Relevant past medical, surgical, family and social history reviewed and updated as indicated. Interim medical history since our last visit reviewed. Allergies and medications reviewed and updated.  Review of Systems  Constitutional: Positive for fatigue. Negative for activity change, appetite change, chills, diaphoresis, fever and unexpected weight change.  HENT: Negative.   Eyes: Negative.   Respiratory: Positive for cough, chest tightness, shortness of breath and wheezing. Negative for apnea, choking and stridor.   Cardiovascular: Negative.   Gastrointestinal: Negative.   Psychiatric/Behavioral: Negative.     Per HPI unless specifically indicated above     Objective:    BP (!) 146/88   Pulse 85   Temp 98.8 F (37.1 C) (Oral)   Ht 5\' 1"  (1.549 m)   Wt 209 lb (94.8 kg)   LMP 08/02/2017 (Exact Date)   SpO2 95%   BMI 39.49 kg/m   Wt Readings from Last 3 Encounters:  01/04/19 209 lb (94.8 kg)   01/18/18 205 lb (93 kg)  01/17/18 205 lb (93 kg)    Physical Exam Vitals signs and nursing note reviewed.  Constitutional:      General: She is not in acute distress.    Appearance: Normal appearance. She is not ill-appearing, toxic-appearing or diaphoretic.  HENT:     Head: Normocephalic and atraumatic.     Right Ear: External ear normal.     Left Ear: External ear normal.     Nose: Nose normal.     Mouth/Throat:     Mouth: Mucous membranes are moist.     Pharynx: Oropharynx is clear.  Eyes:     General: No scleral icterus.       Right eye: No discharge.        Left eye: No discharge.     Extraocular Movements: Extraocular movements intact.     Conjunctiva/sclera: Conjunctivae normal.     Pupils: Pupils are equal, round, and reactive to light.  Neck:     Musculoskeletal: Normal range of motion and neck supple.  Cardiovascular:     Rate and Rhythm: Normal rate and regular rhythm.     Pulses: Normal pulses.     Heart sounds: Normal heart sounds. No murmur. No friction rub. No gallop.   Pulmonary:     Effort: Pulmonary effort is normal. No respiratory distress.     Breath sounds: No stridor. Wheezing and rhonchi present. No rales.  Chest:     Chest wall: No tenderness.  Musculoskeletal:  Normal range of motion.  Skin:    General: Skin is warm and dry.     Capillary Refill: Capillary refill takes less than 2 seconds.     Coloration: Skin is not jaundiced or pale.     Findings: No bruising, erythema, lesion or rash.  Neurological:     General: No focal deficit present.     Mental Status: She is alert and oriented to person, place, and time. Mental status is at baseline.  Psychiatric:        Mood and Affect: Mood normal.        Behavior: Behavior normal.        Thought Content: Thought content normal.        Judgment: Judgment normal.     Results for orders placed or performed in visit on 11/09/18  UA/M w/rflx Culture, Routine   Specimen: Urine   URINE  Result Value  Ref Range   Specific Gravity, UA 1.025 1.005 - 1.030   pH, UA 5.0 5.0 - 7.5   Color, UA Yellow Yellow   Appearance Ur Clear Clear   Leukocytes,UA Negative Negative   Protein,UA Negative Negative/Trace   Glucose, UA Negative Negative   Ketones, UA Negative Negative   RBC, UA Negative Negative   Bilirubin, UA Negative Negative   Urobilinogen, Ur 0.2 0.2 - 1.0 mg/dL   Nitrite, UA Negative Negative  TSH  Result Value Ref Range   TSH 1.420 0.450 - 4.500 uIU/mL  Microalbumin, Urine Waived  Result Value Ref Range   Microalb, Ur Waived 30 (H) 0 - 19 mg/L   Creatinine, Urine Waived 100 10 - 300 mg/dL   Microalb/Creat Ratio <30 <30 mg/g  Lipid Panel w/o Chol/HDL Ratio  Result Value Ref Range   Cholesterol, Total 186 100 - 199 mg/dL   Triglycerides 73 0 - 149 mg/dL   HDL 78 >39 mg/dL   VLDL Cholesterol Cal 15 5 - 40 mg/dL   LDL Calculated 93 0 - 99 mg/dL  Comprehensive metabolic panel  Result Value Ref Range   Glucose 98 65 - 99 mg/dL   BUN 15 6 - 24 mg/dL   Creatinine, Ser 0.69 0.57 - 1.00 mg/dL   GFR calc non Af Amer 100 >59 mL/min/1.73   GFR calc Af Amer 116 >59 mL/min/1.73   BUN/Creatinine Ratio 22 9 - 23   Sodium 137 134 - 144 mmol/L   Potassium 4.9 3.5 - 5.2 mmol/L   Chloride 99 96 - 106 mmol/L   CO2 21 20 - 29 mmol/L   Calcium 9.1 8.7 - 10.2 mg/dL   Total Protein 6.5 6.0 - 8.5 g/dL   Albumin 4.2 3.8 - 4.9 g/dL   Globulin, Total 2.3 1.5 - 4.5 g/dL   Albumin/Globulin Ratio 1.8 1.2 - 2.2   Bilirubin Total 0.3 0.0 - 1.2 mg/dL   Alkaline Phosphatase 113 39 - 117 IU/L   AST 19 0 - 40 IU/L   ALT 14 0 - 32 IU/L  Bayer DCA Hb A1c Waived  Result Value Ref Range   HB A1C (BAYER DCA - WAIVED) 5.5 <7.0 %  CBC with Differential/Platelet  Result Value Ref Range   WBC 6.8 3.4 - 10.8 x10E3/uL   RBC 4.72 3.77 - 5.28 x10E6/uL   Hemoglobin 15.3 11.1 - 15.9 g/dL   Hematocrit 44.1 34.0 - 46.6 %   MCV 93 79 - 97 fL   MCH 32.4 26.6 - 33.0 pg   MCHC 34.7 31.5 - 35.7 g/dL  RDW 12.9  11.7 - 15.4 %   Platelets 259 150 - 450 x10E3/uL   Neutrophils 60 Not Estab. %   Lymphs 28 Not Estab. %   Monocytes 7 Not Estab. %   Eos 4 Not Estab. %   Basos 1 Not Estab. %   Neutrophils Absolute 4.1 1.4 - 7.0 x10E3/uL   Lymphocytes Absolute 1.9 0.7 - 3.1 x10E3/uL   Monocytes Absolute 0.5 0.1 - 0.9 x10E3/uL   EOS (ABSOLUTE) 0.3 0.0 - 0.4 x10E3/uL   Basophils Absolute 0.1 0.0 - 0.2 x10E3/uL   Immature Granulocytes 0 Not Estab. %   Immature Grans (Abs) 0.0 0.0 - 0.1 x10E3/uL      Assessment & Plan:   Problem List Items Addressed This Visit      Respiratory   Asthma - Primary    In exacerbation. Will treat with azithromycin and prednisone taper and start trelegy. Recheck 10 days. Call if not getting better or getting worse.       Relevant Medications   predniSONE (DELTASONE) 10 MG tablet   Other Relevant Orders   Spirometry with graph (Completed)       Follow up plan: Return 10 days, for lung recheck.

## 2019-01-11 ENCOUNTER — Telehealth: Payer: Self-pay | Admitting: Family Medicine

## 2019-01-11 DIAGNOSIS — J4541 Moderate persistent asthma with (acute) exacerbation: Secondary | ICD-10-CM

## 2019-01-11 DIAGNOSIS — J410 Simple chronic bronchitis: Secondary | ICD-10-CM

## 2019-01-11 NOTE — Telephone Encounter (Signed)
Pt returning call to nurse, please call pt. °

## 2019-01-11 NOTE — Telephone Encounter (Signed)
Called patient. Left VM for patient to return call to the office. 

## 2019-01-11 NOTE — Telephone Encounter (Signed)
I'm going to refer her to the pulmonologist to see what we can do to get her under better control. Please have her go back to her old inhalers until she sees them

## 2019-01-11 NOTE — Telephone Encounter (Signed)
Routing to provider to advise.  

## 2019-01-11 NOTE — Telephone Encounter (Signed)
Patient notified of Dr. Johnson's message and verbalized understanding.  

## 2019-01-11 NOTE — Telephone Encounter (Signed)
Pt called and stated that the sample inhaler that dr Wynetta Emery gave her made her have joint pain so she has stopped taking. Pt would like to know if she should go back to old inhaler. Please advise

## 2019-01-14 ENCOUNTER — Ambulatory Visit: Payer: Medicaid Other | Admitting: Family Medicine

## 2019-01-14 ENCOUNTER — Other Ambulatory Visit: Payer: Self-pay | Admitting: Family Medicine

## 2019-01-14 NOTE — Telephone Encounter (Signed)
Requested medication (s) are due for refill today: no  Requested medication (s) are on the active medication list: yes  Last refill: 12/31/2018  Future visit scheduled: yes  Notes to clinic:  Review for refill   Requested Prescriptions  Pending Prescriptions Disp Refills   albuterol (VENTOLIN HFA) 108 (90 Base) MCG/ACT inhaler [Pharmacy Med Name: ALBUTEROL HFA (PROAIR) INHALER]  0    Sig: INHALE 2 PUFFS BY MOUTH EVERY 4 HOURS AS NEEDED     Pulmonology:  Beta Agonists Failed - 01/14/2019  2:11 PM      Failed - One inhaler should last at least one month. If the patient is requesting refills earlier, contact the patient to check for uncontrolled symptoms.      Passed - Valid encounter within last 12 months    Recent Outpatient Visits          1 week ago Moderate persistent asthma with acute exacerbation   [facility] [place], Megan P, DO   2 months ago Obesity due to excess calories with serious comorbidity, unspecified classification   [facility] [place], Megan P, DO   12 months ago Contusion of left knee, subsequent encounter   [facility] [place], [place]   1 year ago Benign hypertensive renal disease   [facility] [place], Megan P, DO   1 year ago Routine general medical examination at a health care facility   [place], Barb Merino, DO      Future Appointments            In 1 month Wynetta Emery, Barb Merino, DO [facility], [place]

## 2019-01-18 ENCOUNTER — Institutional Professional Consult (permissible substitution): Payer: Medicaid Other | Admitting: Pulmonary Disease

## 2019-01-20 ENCOUNTER — Ambulatory Visit: Payer: Medicaid Other | Admitting: Pulmonary Disease

## 2019-01-20 ENCOUNTER — Ambulatory Visit
Admission: RE | Admit: 2019-01-20 | Discharge: 2019-01-20 | Disposition: A | Discharge status: Home / Self Care | Payer: Medicaid Other | Source: Ambulatory Visit | Attending: Pulmonary Disease | Admitting: Pulmonary Disease

## 2019-01-20 ENCOUNTER — Other Ambulatory Visit: Payer: Self-pay

## 2019-01-20 ENCOUNTER — Encounter: Payer: Self-pay | Admitting: Pulmonary Disease

## 2019-01-20 VITALS — BP 142/80 | HR 102 | Temp 97.8°F | Ht 61.0 in | Wt 205.8 lb

## 2019-01-20 DIAGNOSIS — J449 Chronic obstructive pulmonary disease, unspecified: Secondary | ICD-10-CM | POA: Insufficient documentation

## 2019-01-20 DIAGNOSIS — E6609 Other obesity due to excess calories: Secondary | ICD-10-CM

## 2019-01-20 DIAGNOSIS — R0602 Shortness of breath: Secondary | ICD-10-CM

## 2019-01-20 DIAGNOSIS — R05 Cough: Secondary | ICD-10-CM | POA: Diagnosis not present

## 2019-01-20 DIAGNOSIS — F17210 Nicotine dependence, cigarettes, uncomplicated: Secondary | ICD-10-CM

## 2019-01-20 DIAGNOSIS — Z6838 Body mass index (BMI) 38.0-38.9, adult: Secondary | ICD-10-CM

## 2019-01-20 MED ORDER — BUDESONIDE-FORMOTEROL FUMARATE 160-4.5 MCG/ACT IN AERO: 2.0000 | INHALATION_SPRAY | Freq: Two times a day (BID) | RESPIRATORY_TRACT | 0 refills | Status: DC

## 2019-01-20 NOTE — Patient Instructions (Signed)
1.  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  2.  We will discontinue Advair and resume Symbicort as you felt that this helped you before.  Use with spacer.  Rinse well after use.  3.  We will get a chest x-ray today.  4.  We will order formal breathing test.  5.  We will see you after all of the above are completed.  Sooner should you have any new breathing difficulties.  6.  For assistance with your Symbicort we have referred you to the [facility] website.  7.  Thank you for trusting [place] with your health care issues.

## 2019-01-20 NOTE — Progress Notes (Signed)
 Subjective:    Patient ID: Nancy Stout, female    DOB: 1965/04/22, 53 y.o.   MRN: [id number]  HPI The patient is a 53 year old current smoker (1 PPD) who presents for evaluation of dyspnea in the setting of a history of asthma.  She is kindly referred by Dr. Duwaine Louder.  Patient states that she was diagnosed with asthma approximately 15 to 20 years ago.  She had been initially on Symbicort  which she felt control her symptoms well.  Subsequently she was switched to Advair and has noted that her symptoms are not as well controlled.  She has to use her albuterol  inhaler 2-3 times per day on average due to poorly controlled symptoms.  She believes the switch to Advair was made due to insurance constraints.  She has noted increasing dyspnea with exertion.  She had an exacerbation of asthma bronchitis in the latter part of September and was treated with azithromycin  Z-Pak and prednisone .  She noted that this helped her.  She was switched to Trelegy briefly but noticed that this aggravated her joint pain.  She states however that while she was taking the Trelegy her breathing was better.  She went back to Advair and her symptoms feel poorly controlled again.  She has not had any fevers, chills or sweats.  No chest pain.  She does have reflux and indigestion frequently.  She is on omeprazole  but takes it only as needed.  Previously she had had a spirometric study performed on 04 January 2019.  This showed an FEV1 of 1.4 L or 57% predicted with an FEV1/FVC of 57% indicating moderate obstructive defect.  Recent COVID-19 test on 9 October was negative.  She has not had any dyspnea, paroxysmal nocturnal dyspnea or lower extremity edema.  Past medical history, surgical history and family history have been reviewed.   Review of Systems  Constitutional: Positive for fatigue.  Eyes: Negative.   Respiratory: Positive for cough, chest tightness, shortness of breath and wheezing. Negative for apnea,  choking and stridor.   Cardiovascular: Negative.   Gastrointestinal:       Esophageal reflux and heartburn.  Endocrine: Negative.   Genitourinary: Negative.   Musculoskeletal: Negative.   Skin: Negative.   Allergic/Immunologic: Negative.   Neurological: Positive for weakness.  Hematological: Negative.   Psychiatric/Behavioral: Negative.   All other systems reviewed and are negative.      Objective:   Physical Exam Vitals signs and nursing note reviewed.  Constitutional:      General: She is not in acute distress.    Appearance: She is obese.     Comments: Plethoric.  HENT:     Head: Normocephalic and atraumatic.     Right Ear: External ear normal.     Left Ear: External ear normal.     Nose:     Comments: Nose/mouth/throat not examined due to masking requirements for COVID 19. Eyes:     General: No scleral icterus.    Conjunctiva/sclera: Conjunctivae normal.     Pupils: Pupils are equal, round, and reactive to light.  Neck:     Musculoskeletal: Neck supple.     Thyroid: No thyromegaly.     Vascular: No JVD.     Trachea: Trachea and phonation normal.  Lymphadenopathy:     Cervical: No cervical adenopathy.  Neurological:     Mental Status: She is alert.           Assessment & Plan:   1. Shortness of breath (Primary)  2.  Asthma-COPD overlap syndrome (HCC) - DG Chest 2 View; Future  3. Tobacco dependence due to cigarettes  4. Class 2 obesity due to excess calories with body mass index (BMI) of 38.0 to 38.9 in adult, unspecified whether serious comorbidity present  Patient Instructions  1.  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  2.  We will discontinue Advair and resume Symbicort  as you felt that this helped you before.  Use with spacer.  Rinse well after use.  3.  We will get a chest x-ray today.  4.  We will order formal breathing test.  5.  We will see you after all of the above are completed.  Sooner should you have any new breathing  difficulties.  6.  For assistance with your Symbicort  we have referred you to the [facility] website.  7.  Thank you for trusting us  with your health care issues.   Please note: late entry documentation due to logistical difficulties during COVID-19 pandemic. This note is filed for information purposes only, and is not intended to be used for billing, nor does it represent the full scope/nature of the visit in question. Please see any associated scanned media linked to date of encounter for additional pertinent information.

## 2019-02-08 ENCOUNTER — Other Ambulatory Visit: Payer: Self-pay | Admitting: Family Medicine

## 2019-02-08 NOTE — Telephone Encounter (Signed)
Requested medication (s) are due for refill today: yes  Requested medication (s) are on the active medication list: yes  Last refill:  01/14/2019  Future visit scheduled: yes  Notes to clinic:  One inhaler should last at least one month Review for refill   Requested Prescriptions  Pending Prescriptions Disp Refills   albuterol (VENTOLIN HFA) 108 (90 Base) MCG/ACT inhaler [Pharmacy Med Name: ALBUTEROL HFA (VENTOLIN) INH]  0    Sig: TAKE 2 PUFFS BY MOUTH EVERY 4 HOURS AS NEEDED     Pulmonology:  Beta Agonists Failed - 02/08/2019 10:49 AM      Failed - One inhaler should last at least one month. If the patient is requesting refills earlier, contact the patient to check for uncontrolled symptoms.      Passed - Valid encounter within last 12 months    Recent Outpatient Visits          1 month ago Moderate persistent asthma with acute exacerbation   [facility] [place], Megan P, DO   3 months ago Obesity due to excess calories with serious comorbidity, unspecified classification   [facility] [place], [place], DO   1 year ago Contusion of left knee, subsequent encounter   [facility], [place]   1 year ago Benign hypertensive renal disease   [facility] [place], Megan P, DO   1 year ago Routine general medical examination at a health care facility   [place], Barb Merino, DO      Future Appointments            In 1 month Wynetta Emery, Barb Merino, DO [facility], [place]

## 2019-02-08 NOTE — Telephone Encounter (Signed)
Routing to provider  

## 2019-02-24 ENCOUNTER — Other Ambulatory Visit: Payer: Self-pay | Admitting: Family Medicine

## 2019-02-24 NOTE — Telephone Encounter (Signed)
Routing to provider  

## 2019-02-24 NOTE — Telephone Encounter (Signed)
Requested medication (s) are due for refill today: no  Requested medication (s) are on the active medication list: yes  Last refill:  02/08/2019  Future visit scheduled: yes  Notes to clinic: one inhaler should last at least on month Review for refill   Requested Prescriptions  Pending Prescriptions Disp Refills   albuterol (VENTOLIN HFA) 108 (90 Base) MCG/ACT inhaler [Pharmacy Med Name: ALBUTEROL HFA (PROAIR) INHALER]  0    Sig: INHALE 2 PUFFS BY MOUTH EVERY 4 HOURS AS NEEDED     Pulmonology:  Beta Agonists Failed - 02/24/2019 10:41 AM      Failed - One inhaler should last at least one month. If the patient is requesting refills earlier, contact the patient to check for uncontrolled symptoms.      Passed - Valid encounter within last 12 months    Recent Outpatient Visits          1 month ago Moderate persistent asthma with acute exacerbation   [facility] [place], Megan P, DO   3 months ago Obesity due to excess calories with serious comorbidity, unspecified classification   [facility] [place], [place], DO   1 year ago Contusion of left knee, subsequent encounter   [facility] [place], [place]   1 year ago Benign hypertensive renal disease   [facility] [place], Megan P, DO   1 year ago Routine general medical examination at a health care facility   [place], Barb Merino, DO      Future Appointments            In 2 weeks Wynetta Emery, Barb Merino, DO [facility], [facility]

## 2019-02-25 NOTE — Telephone Encounter (Signed)
Pt following up on refill  albuterol (VENTOLIN HFA) 108 (90 Base) MCG/ACT inhaler  Pt has appt with pulmonary on 04/03/19  [id number] [place], [place] - [address] [phone] (Phone) [phone] (Fax)

## 2019-02-28 NOTE — Telephone Encounter (Signed)
Called patient, no answer, left a message asking patient to return my call.   

## 2019-02-28 NOTE — Telephone Encounter (Signed)
Called in on 11/3- I have not gotten another refill request that I can see, did she request another?

## 2019-02-28 NOTE — Telephone Encounter (Signed)
Please see message below

## 2019-02-28 NOTE — Telephone Encounter (Signed)
Pt calling back this am to check status of the Rx.

## 2019-02-28 NOTE — Telephone Encounter (Signed)
Pt calling back. Please advise   * yes pt will need a refill on inhaler

## 2019-03-14 ENCOUNTER — Encounter: Payer: Medicaid Other | Admitting: Family Medicine

## 2019-03-15 ENCOUNTER — Telehealth: Payer: Self-pay | Admitting: Pulmonary Disease

## 2019-03-15 MED ORDER — ALBUTEROL SULFATE HFA 108 (90 BASE) MCG/ACT IN AERS: INHALATION_SPRAY | RESPIRATORY_TRACT | 11 refills | Status: DC

## 2019-03-15 MED ORDER — BUDESONIDE-FORMOTEROL FUMARATE 160-4.5 MCG/ACT IN AERO: 2.0000 | INHALATION_SPRAY | Freq: Two times a day (BID) | RESPIRATORY_TRACT | 11 refills | Status: DC

## 2019-03-15 NOTE — Telephone Encounter (Signed)
Called and spoke to pt, who is requesting a printed Rx for symbicort 160 and ventolin.  Pt would like to come by our office and pick up Rx.    LG please advise if okay to prescribe Ventolin, as this was previously prescribed by PCP. Thanks

## 2019-03-15 NOTE — Telephone Encounter (Signed)
Rx for symbicort and Ventolin has been placed up front for pick up. Pt is aware and voiced her understanding. Nothing further is needed.

## 2019-03-15 NOTE — Telephone Encounter (Signed)
Okay to both

## 2019-03-24 ENCOUNTER — Telehealth: Payer: Self-pay | Admitting: Pulmonary Disease

## 2019-03-24 NOTE — Telephone Encounter (Signed)
Spoke to pt and attempted to relay date/time of covid test. Pt stated that she will call back to obtain date/time, as she is currently at the grocery store.   03/28/2019 [facility]

## 2019-03-25 ENCOUNTER — Other Ambulatory Visit: Payer: Self-pay | Admitting: Family Medicine

## 2019-03-25 ENCOUNTER — Telehealth: Payer: Self-pay | Admitting: Pulmonary Disease

## 2019-03-25 MED ORDER — ALBUTEROL SULFATE HFA 108 (90 BASE) MCG/ACT IN AERS: INHALATION_SPRAY | RESPIRATORY_TRACT | 11 refills | Status: DC

## 2019-03-25 MED ORDER — BUDESONIDE-FORMOTEROL FUMARATE 160-4.5 MCG/ACT IN AERO: 2.0000 | INHALATION_SPRAY | Freq: Two times a day (BID) | RESPIRATORY_TRACT | 11 refills | Status: DC

## 2019-03-25 NOTE — Addendum Note (Signed)
Addended by: Maryanna Shape A on: 03/25/2019 09:25 AM   Modules accepted: Orders

## 2019-03-25 NOTE — Telephone Encounter (Signed)
Called and spoke with patient and she is aware of both PFT and COVID test on 04/18/2019 between 8:00 am - 11:00 am.  Phone message sent to nurse so that PFT can be documented and COVID test placed on list to call. [name]

## 2019-03-25 NOTE — Telephone Encounter (Signed)
Called and spoke to pt, regarding date/time of covid test.  Pt will need to reschedule, as her mother in law has an appointment same day/time.

## 2019-03-25 NOTE — Telephone Encounter (Signed)
Called and spoke to pt, who is requesting refills on Symbicort and ventolin.  Per our records, [facility] Pt is aware and voiced her understanding. Nothing further is needed.

## 2019-03-25 NOTE — Telephone Encounter (Signed)
Called and R/S PFT to Tues 04/19/2019 at 10:15 at [facility]. Pt to arrive at 10:00. COVID test to be scheduled on Monday 04/18/2019 at the [place].  [name]

## 2019-03-25 NOTE — Telephone Encounter (Signed)
Noted. PFT has been documented.  Nothing further is needed.

## 2019-04-04 ENCOUNTER — Other Ambulatory Visit: Payer: Medicaid Other

## 2019-04-05 ENCOUNTER — Ambulatory Visit: Payer: Medicaid Other

## 2019-04-18 ENCOUNTER — Encounter: Payer: Medicaid Other | Admitting: Family Medicine

## 2019-04-19 ENCOUNTER — Ambulatory Visit: Payer: Medicaid Other

## 2019-06-11 ENCOUNTER — Other Ambulatory Visit: Payer: Self-pay | Admitting: Family Medicine

## 2019-06-11 NOTE — Telephone Encounter (Signed)
Requested Prescriptions  Pending Prescriptions Disp Refills  . montelukast (SINGULAIR) 10 MG tablet [Pharmacy Med Name: MONTELUKAST SOD 10 MG TABLET] 90 tablet 1    Sig: TAKE 1 TABLET BY MOUTH EVERYDAY AT BEDTIME     Pulmonology:  Leukotriene Inhibitors Passed - 06/11/2019 10:45 AM      Passed - Valid encounter within last 12 months    Recent Outpatient Visits          5 months ago Moderate persistent asthma with acute exacerbation   [facility] [place], Megan P, DO   7 months ago Obesity due to excess calories with serious comorbidity, unspecified classification   [facility] [place], Megan P, DO   1 year ago Contusion of left knee, subsequent encounter   [facility] [place], [place]   1 year ago Benign hypertensive renal disease   [facility] [place], Megan P, DO   1 year ago Routine general medical examination at a health care facility   [facility], [place], DO

## 2019-07-02 ENCOUNTER — Other Ambulatory Visit: Payer: Self-pay | Admitting: Family Medicine

## 2019-07-02 NOTE — Telephone Encounter (Signed)
Requested Prescriptions  Pending Prescriptions Disp Refills  . lisinopril (ZESTRIL) 20 MG tablet [Pharmacy Med Name: LISINOPRIL 20 MG TABLET] 30 tablet 0    Sig: TAKE 1 TABLET BY MOUTH EVERY DAY     Cardiovascular:  ACE Inhibitors Failed - 07/02/2019  5:04 PM      Failed - Cr in normal range and within 180 days    Creatinine, Ser  Date Value Ref Range Status  11/09/2018 0.69 0.57 - 1.00 mg/dL Final         Failed - K in normal range and within 180 days    Potassium  Date Value Ref Range Status  11/09/2018 4.9 3.5 - 5.2 mmol/L Final         Failed - Last BP in normal range    BP Readings from Last 1 Encounters:  01/20/19 (!) 142/80         Passed - Patient is not pregnant      Passed - Valid encounter within last 6 months    Recent Outpatient Visits          5 months ago Moderate persistent asthma with acute exacerbation   [facility] [place], Megan P, DO   8 months ago Obesity due to excess calories with serious comorbidity, unspecified classification   [facility] [place], Megan P, DO   1 year ago Contusion of left knee, subsequent encounter   [facility] [place], [place]   1 year ago Benign hypertensive renal disease   [facility] [place], Megan P, DO   1 year ago Routine general medical examination at a health care facility   [facility], [place], DO

## 2019-07-04 ENCOUNTER — Other Ambulatory Visit: Payer: Self-pay | Admitting: Family Medicine

## 2019-07-17 ENCOUNTER — Other Ambulatory Visit: Payer: Self-pay | Admitting: Family Medicine

## 2019-07-17 NOTE — Telephone Encounter (Signed)
Requested Prescriptions  Pending Prescriptions Disp Refills  . pravastatin (PRAVACHOL) 40 MG tablet [Pharmacy Med Name: PRAVASTATIN SODIUM 40 MG TAB] 90 tablet 1    Sig: TAKE 1 TABLET BY MOUTH EVERY DAY     Cardiovascular:  Antilipid - Statins Passed - 07/17/2019  3:41 PM      Passed - Total Cholesterol in normal range and within 360 days    Cholesterol, Total  Date Value Ref Range Status  11/09/2018 186 100 - 199 mg/dL Final   Cholesterol Piccolo, Waived  Date Value Ref Range Status  01/19/2015 221 (H) <200 mg/dL Final    Comment:                            Desirable                <200                         Borderline High      200- 239                         High                     >239          Passed - LDL in normal range and within 360 days    LDL Calculated  Date Value Ref Range Status  11/09/2018 93 0 - 99 mg/dL Final         Passed - HDL in normal range and within 360 days    HDL  Date Value Ref Range Status  11/09/2018 78 >39 mg/dL Final         Passed - Triglycerides in normal range and within 360 days    Triglycerides  Date Value Ref Range Status  11/09/2018 73 0 - 149 mg/dL Final   Triglycerides Piccolo,Waived  Date Value Ref Range Status  01/19/2015 91 <150 mg/dL Final    Comment:                            Normal                   <150                         Borderline High     150 - 199                         High                200 - 499                         Very High                >499          Passed - Patient is not pregnant      Passed - Valid encounter within last 12 months    Recent Outpatient Visits          6 months ago Moderate persistent asthma with acute exacerbation   [facility] [place], Megan P, DO   8 months ago Obesity due to excess calories with serious comorbidity, unspecified classification  [place], Megan P, DO   1 year ago Contusion of left knee, subsequent encounter   [facility] [place], [place]   1 year ago Benign hypertensive renal disease   [facility] [place], Megan P, DO   1 year ago Routine general medical examination at a health care facility   [facility], [place], [place]

## 2019-08-10 ENCOUNTER — Ambulatory Visit: Payer: Medicaid Other

## 2019-08-16 ENCOUNTER — Other Ambulatory Visit: Payer: Medicaid Other

## 2019-08-17 ENCOUNTER — Ambulatory Visit: Payer: Medicaid Other

## 2019-08-18 ENCOUNTER — Other Ambulatory Visit: Payer: Self-pay | Admitting: Family Medicine

## 2019-08-18 NOTE — Telephone Encounter (Signed)
Requested Prescriptions  Pending Prescriptions Disp Refills  . lisinopril (ZESTRIL) 20 MG tablet [Pharmacy Med Name: LISINOPRIL 20 MG TABLET] 30 tablet 0    Sig: TAKE 1 TABLET BY MOUTH EVERY DAY     Cardiovascular:  ACE Inhibitors Failed - 08/18/2019  5:33 PM      Failed - Cr in normal range and within 180 days    Creatinine, Ser  Date Value Ref Range Status  11/09/2018 0.69 0.57 - 1.00 mg/dL Final         Failed - K in normal range and within 180 days    Potassium  Date Value Ref Range Status  11/09/2018 4.9 3.5 - 5.2 mmol/L Final         Failed - Last BP in normal range    BP Readings from Last 1 Encounters:  01/20/19 (!) 142/80         Failed - Valid encounter within last 6 months    Recent Outpatient Visits          7 months ago Moderate persistent asthma with acute exacerbation   [facility] [place], Megan P, DO   9 months ago Obesity due to excess calories with serious comorbidity, unspecified classification   [facility] [place], Megan P, DO   1 year ago Contusion of left knee, subsequent encounter   [facility] [place], [place]   1 year ago Benign hypertensive renal disease   [facility] [place], [place], DO   2 years ago Routine general medical examination at a health care facility   [facility], [place], [place]             Passed - Patient is not pregnant      Courtesy refill given. Patient needs OV and labs. Left VM for her to return call to schedule appointment.

## 2019-08-22 ENCOUNTER — Other Ambulatory Visit: Payer: Self-pay | Admitting: Family Medicine

## 2019-10-19 ENCOUNTER — Other Ambulatory Visit: Payer: Self-pay | Admitting: Family Medicine

## 2019-11-18 ENCOUNTER — Other Ambulatory Visit: Payer: Self-pay | Admitting: Family Medicine

## 2019-11-18 NOTE — Telephone Encounter (Signed)
Requested medication (s) are due for refill today: yes  Requested medication (s) are on the active medication list: yes  Last refill:  08/18/2019  Future visit scheduled: no  Notes to clinic:  patient has not scheduled a follow up    Requested Prescriptions  Pending Prescriptions Disp Refills   lisinopril (ZESTRIL) 20 MG tablet [Pharmacy Med Name: LISINOPRIL 20 MG TABLET] 30 tablet 0    Sig: TAKE 1 TABLET BY MOUTH EVERY DAY      Cardiovascular:  ACE Inhibitors Failed - 11/18/2019 10:12 AM      Failed - Cr in normal range and within 180 days    Creatinine, Ser  Date Value Ref Range Status  11/09/2018 0.69 0.57 - 1.00 mg/dL Final          Failed - K in normal range and within 180 days    Potassium  Date Value Ref Range Status  11/09/2018 4.9 3.5 - 5.2 mmol/L Final          Failed - Last BP in normal range    BP Readings from Last 1 Encounters:  01/20/19 (!) 142/80          Failed - Valid encounter within last 6 months    Recent Outpatient Visits           10 months ago Moderate persistent asthma with acute exacerbation   [facility] [place], Megan P, DO   1 year ago Obesity due to excess calories with serious comorbidity, unspecified classification   [facility] [place], Megan P, DO   1 year ago Contusion of left knee, subsequent encounter   [facility] [place], [place]   2 years ago Benign hypertensive renal disease   [facility] [place], [place], DO   2 years ago Routine general medical examination at a health care facility   [facility], [place], [place]              Passed - Patient is not pregnant

## 2019-11-18 NOTE — Telephone Encounter (Signed)
Patient last seen 01/04/20

## 2019-11-18 NOTE — Telephone Encounter (Signed)
cf

## 2019-11-24 ENCOUNTER — Other Ambulatory Visit: Payer: Self-pay | Admitting: Family Medicine

## 2019-11-27 ENCOUNTER — Other Ambulatory Visit: Payer: Self-pay | Admitting: Family Medicine

## 2019-11-27 NOTE — Telephone Encounter (Signed)
Requested Prescriptions  Pending Prescriptions Disp Refills  . fluticasone (FLONASE) 50 MCG/ACT nasal spray [Pharmacy Med Name: FLUTICASONE PROP 50 MCG SPRAY] 16 mL 6    Sig: SPRAY 2 SPRAYS INTO EACH NOSTRIL EVERY DAY     Ear, Nose, and Throat: Nasal Preparations - Corticosteroids Passed - 11/27/2019  9:36 AM      Passed - Valid encounter within last 12 months    Recent Outpatient Visits          10 months ago Moderate persistent asthma with acute exacerbation   [facility] [place], Megan P, DO   1 year ago Obesity due to excess calories with serious comorbidity, unspecified classification   [facility] [place], Megan P, DO   1 year ago Contusion of left knee, subsequent encounter   [facility] [place], [place]   2 years ago Benign hypertensive renal disease   [facility] [place] [place]. [place], [place], DO   2 years ago Routine general medical examination at a health care facility   [facility], [place], DO

## 2019-12-14 ENCOUNTER — Other Ambulatory Visit: Payer: Self-pay | Admitting: Family Medicine

## 2019-12-14 MED ORDER — LISINOPRIL 20 MG PO TABS: 20.0000 mg | ORAL_TABLET | Freq: Every day | ORAL | 0 refills | Status: DC

## 2019-12-14 NOTE — Telephone Encounter (Signed)
Pt has apt on 12/21/2019 made per [facility].

## 2019-12-14 NOTE — Telephone Encounter (Signed)
RX REFILL lisinopril (ZESTRIL) 20 MG tablet [[id number]]  PHARMACY [id number] - [place], [place] - [address] Phone:  [phone]  Fax:  [phone]

## 2019-12-14 NOTE — Telephone Encounter (Signed)
Pt verbalized understanding.

## 2019-12-14 NOTE — Telephone Encounter (Signed)
Patient has not been seen in almost a year. Please call and schedule an appointment then route to provider for refill.

## 2019-12-14 NOTE — Telephone Encounter (Signed)
Requested medication (s) are due for refill today overdue  Requested medication (s) are on the active medication list yes  Future visit scheduled yes  Last refill: 08/18/19  Notes to clinic: last RF 08/18/19- sent for review   Requested Prescriptions  Pending Prescriptions Disp Refills   lisinopril (ZESTRIL) 20 MG tablet 30 tablet 0    Sig: Take 1 tablet (20 mg total) by mouth daily.      Cardiovascular:  ACE Inhibitors Failed - 12/14/2019 10:04 AM      Failed - Cr in normal range and within 180 days    Creatinine, Ser  Date Value Ref Range Status  11/09/2018 0.69 0.57 - 1.00 mg/dL Final          Failed - K in normal range and within 180 days    Potassium  Date Value Ref Range Status  11/09/2018 4.9 3.5 - 5.2 mmol/L Final          Failed - Last BP in normal range    BP Readings from Last 1 Encounters:  01/20/19 (!) 142/80          Failed - Valid encounter within last 6 months    Recent Outpatient Visits           11 months ago Moderate persistent asthma with acute exacerbation   [facility] [place], Megan P, DO   1 year ago Obesity due to excess calories with serious comorbidity, unspecified classification   [facility] [place], Megan P, DO   1 year ago Contusion of left knee, subsequent encounter   [facility] [place], [place]   2 years ago Benign hypertensive renal disease   [facility] [place], [place], DO   2 years ago Routine general medical examination at a health care facility   [facility] [place], [place], DO       Future Appointments             In 1 week Laural [place]es, Megan P, DO [facility], [facility]            Passed - Patient is not pregnant          Requested Prescriptions  Pending Prescriptions Disp Refills   lisinopril (ZESTRIL) 20 MG tablet 30 tablet 0    Sig: Take 1 tablet (20 mg total) by mouth daily.      Cardiovascular:  ACE Inhibitors Failed -  12/14/2019 10:04 AM      Failed - Cr in normal range and within 180 days    Creatinine, Ser  Date Value Ref Range Status  11/09/2018 0.69 0.57 - 1.00 mg/dL Final          Failed - K in normal range and within 180 days    Potassium  Date Value Ref Range Status  11/09/2018 4.9 3.5 - 5.2 mmol/L Final          Failed - Last BP in normal range    BP Readings from Last 1 Encounters:  01/20/19 (!) 142/80          Failed - Valid encounter within last 6 months    Recent Outpatient Visits           11 months ago Moderate persistent asthma with acute exacerbation   [facility] [place], Megan P, DO   1 year ago Obesity due to excess calories with serious comorbidity, unspecified classification   [facility] [place], Megan P, DO  1 year ago Contusion of left knee, subsequent encounter   [facility] [place], [place]   2 years ago Benign hypertensive renal disease   [facility] [place], [place], DO   2 years ago Routine general medical examination at a health care facility   [facility] [place], Oralia Rud, DO       Future Appointments             In 1 week Laural Benes, Oralia Rud, DO [facility], [facility]            Passed - Patient is not pregnant

## 2019-12-21 ENCOUNTER — Encounter: Payer: Self-pay | Admitting: Family Medicine

## 2019-12-21 ENCOUNTER — Ambulatory Visit (INDEPENDENT_AMBULATORY_CARE_PROVIDER_SITE_OTHER): Payer: Medicaid Other | Admitting: Family Medicine

## 2019-12-21 ENCOUNTER — Other Ambulatory Visit: Payer: Self-pay

## 2019-12-21 ENCOUNTER — Ambulatory Visit
Admission: RE | Admit: 2019-12-21 | Discharge: 2019-12-21 | Disposition: A | Discharge status: Home / Self Care | Payer: Medicaid Other | Source: Ambulatory Visit | Attending: Family Medicine | Admitting: Family Medicine

## 2019-12-21 VITALS — BP 162/84 | HR 86 | Temp 98.6°F | Ht 61.0 in | Wt 217.0 lb

## 2019-12-21 DIAGNOSIS — Z1231 Encounter for screening mammogram for malignant neoplasm of breast: Secondary | ICD-10-CM | POA: Diagnosis not present

## 2019-12-21 DIAGNOSIS — J410 Simple chronic bronchitis: Secondary | ICD-10-CM

## 2019-12-21 DIAGNOSIS — I129 Hypertensive chronic kidney disease with stage 1 through stage 4 chronic kidney disease, or unspecified chronic kidney disease: Secondary | ICD-10-CM

## 2019-12-21 DIAGNOSIS — E782 Mixed hyperlipidemia: Secondary | ICD-10-CM

## 2019-12-21 DIAGNOSIS — Z1159 Encounter for screening for other viral diseases: Secondary | ICD-10-CM

## 2019-12-21 DIAGNOSIS — D751 Secondary polycythemia: Secondary | ICD-10-CM

## 2019-12-21 LAB — URINALYSIS, ROUTINE W REFLEX MICROSCOPIC
Bilirubin, UA: NEGATIVE
Glucose, UA: NEGATIVE
Ketones, UA: NEGATIVE
Leukocytes,UA: NEGATIVE
Nitrite, UA: NEGATIVE
Protein,UA: NEGATIVE
RBC, UA: NEGATIVE
Specific Gravity, UA: 1.025 (ref 1.005–1.030)
Urobilinogen, Ur: 0.2 mg/dL (ref 0.2–1.0)
pH, UA: 5 (ref 5.0–7.5)

## 2019-12-21 LAB — MICROALBUMIN, URINE WAIVED
Creatinine, Urine Waived: 200 mg/dL (ref 10–300)
Microalb, Ur Waived: 30 mg/L — ABNORMAL HIGH (ref 0–19)
Microalb/Creat Ratio: <30 mg/g (ref <30)

## 2019-12-21 MED ORDER — BUDESONIDE-FORMOTEROL FUMARATE 160-4.5 MCG/ACT IN AERO
2.0000 | INHALATION_SPRAY | Freq: Two times a day (BID) | RESPIRATORY_TRACT | 12 refills | Status: DC
Start: 2019-12-21 — End: 2020-08-22

## 2019-12-21 MED ORDER — FLUTICASONE PROPIONATE 50 MCG/ACT NA SUSP
NASAL | 6 refills | Status: DC
Start: 2019-12-21 — End: 2020-08-22

## 2019-12-21 MED ORDER — GABAPENTIN 400 MG PO CAPS
400.0000 mg | ORAL_CAPSULE | Freq: Three times a day (TID) | ORAL | 1 refills | Status: DC
Start: 2019-12-21 — End: 2020-07-27

## 2019-12-21 MED ORDER — SPIRIVA HANDIHALER 18 MCG IN CAPS: 18.0000 ug | ORAL_CAPSULE | Freq: Every day | RESPIRATORY_TRACT | 12 refills | Status: DC

## 2019-12-21 MED ORDER — MONTELUKAST SODIUM 10 MG PO TABS
ORAL_TABLET | ORAL | 1 refills | Status: DC
Start: 2019-12-21 — End: 2020-05-08

## 2019-12-21 MED ORDER — ALBUTEROL SULFATE HFA 108 (90 BASE) MCG/ACT IN AERS
INHALATION_SPRAY | RESPIRATORY_TRACT | 11 refills | Status: DC
Start: 2019-12-21 — End: 2020-08-10

## 2019-12-21 MED ORDER — OMEPRAZOLE 20 MG PO CPDR
20.0000 mg | DELAYED_RELEASE_CAPSULE | Freq: Every day | ORAL | 1 refills | Status: DC
Start: 2019-12-21 — End: 2020-05-08

## 2019-12-21 MED ORDER — LISINOPRIL 20 MG PO TABS
20.0000 mg | ORAL_TABLET | Freq: Every day | ORAL | 1 refills | Status: DC
Start: 2019-12-21 — End: 2020-05-08

## 2019-12-21 MED ORDER — NAPROXEN 500 MG PO TABS
500.0000 mg | ORAL_TABLET | Freq: Two times a day (BID) | ORAL | 1 refills | Status: DC
Start: 2019-12-21 — End: 2020-08-22

## 2019-12-21 MED ORDER — PRAVASTATIN SODIUM 40 MG PO TABS
40.0000 mg | ORAL_TABLET | Freq: Every day | ORAL | 1 refills | Status: DC
Start: 2019-12-21 — End: 2020-08-10

## 2019-12-21 NOTE — Progress Notes (Signed)
BP (!) 162/84 (BP Location: Left Arm, Cuff Size: Normal)   Pulse 86   Temp 98.6 F (37 C) (Oral)   Ht 5\' 1"  (1.549 m)   Wt 217 lb (98.4 kg)   LMP 08/02/2017 (Exact Date)   SpO2 95%   BMI 41.00 kg/m    Subjective:    Patient ID: [id number], female    DOB: 1965-10-16, 54 y.o.   MRN: [id number]  HPI: Nancy Stout is a 54 y.o. female  Chief Complaint  Patient presents with  . Hypertension  . Hyperlipidemia   HYPERTENSION / HYPERLIPIDEMIA- did not take her lisinopril yesterday, but did not take it today.  Satisfied with current treatment? yes Duration of hypertension: chronic BP monitoring frequency: not checking BP medication side effects: no Past BP meds: lisinopril Duration of hyperlipidemia: chronic Cholesterol medication side effects: no Cholesterol supplements: none Past cholesterol medications: pravastatin Medication compliance: excellent compliance Aspirin: no Recent stressors: no Recurrent headaches: no Visual changes: no Palpitations: no Dyspnea: no Chest pain: no Lower extremity edema: yes Dizzy/lightheaded: no  COPD COPD status: stable Satisfied with current treatment?: yes Oxygen use: no Dyspnea frequency: occasionally Cough frequency: occasionally Rescue inhaler frequency:  occasionally Limitation of activity: no Productive cough: no Pneumovax: Up to Date Influenza: Up to Date  Relevant past medical, surgical, family and social history reviewed and updated as indicated. Interim medical history since our last visit reviewed. Allergies and medications reviewed and updated.  Review of Systems  Constitutional: Negative.   HENT: Negative.   Respiratory: Negative.   Cardiovascular: Negative.   Gastrointestinal: Negative.   Musculoskeletal: Negative.   Psychiatric/Behavioral: Negative.     Per HPI unless specifically indicated above     Objective:    BP (!) 162/84 (BP Location: Left Arm, Cuff Size: Normal)   Pulse 86   Temp  98.6 F (37 C) (Oral)   Ht 5\' 1"  (1.549 m)   Wt 217 lb (98.4 kg)   LMP 08/02/2017 (Exact Date)   SpO2 95%   BMI 41.00 kg/m   Wt Readings from Last 3 Encounters:  12/21/19 217 lb (98.4 kg)  01/20/19 205 lb 12.8 oz (93.4 kg)  01/04/19 209 lb (94.8 kg)    Physical Exam Vitals and nursing note reviewed.  Constitutional:      General: She is not in acute distress.    Appearance: Normal appearance. She is not ill-appearing, toxic-appearing or diaphoretic.  HENT:     Head: Normocephalic and atraumatic.     Right Ear: External ear normal.     Left Ear: External ear normal.     Nose: Nose normal.     Mouth/Throat:     Mouth: Mucous membranes are moist.     Pharynx: Oropharynx is clear.  Eyes:     General: No scleral icterus.       Right eye: No discharge.        Left eye: No discharge.     Extraocular Movements: Extraocular movements intact.     Conjunctiva/sclera: Conjunctivae normal.     Pupils: Pupils are equal, round, and reactive to light.  Cardiovascular:     Rate and Rhythm: Normal rate and regular rhythm.     Pulses: Normal pulses.     Heart sounds: Normal heart sounds. No murmur heard.  No friction rub. No gallop.   Pulmonary:     Effort: Pulmonary effort is normal. No respiratory distress.     Breath sounds: Normal breath sounds. No stridor. No wheezing, rhonchi  or rales.  Chest:     Chest wall: No tenderness.  Musculoskeletal:        General: Normal range of motion.     Cervical back: Normal range of motion and neck supple.  Skin:    General: Skin is warm and dry.     Capillary Refill: Capillary refill takes less than 2 seconds.     Coloration: Skin is not jaundiced or pale.     Findings: No bruising, erythema, lesion or rash.  Neurological:     General: No focal deficit present.     Mental Status: She is alert and oriented to person, place, and time. Mental status is at baseline.  Psychiatric:        Mood and Affect: Mood normal.        Behavior: Behavior  normal.        Thought Content: Thought content normal.        Judgment: Judgment normal.     Results for orders placed or performed in visit on 12/21/19  CBC with Differential/Platelet  Result Value Ref Range   WBC 6.0 3.4 - 10.8 x10E3/uL   RBC 5.21 3.77 - 5.28 x10E6/uL   Hemoglobin 17.3 (H) 11.1 - 15.9 g/dL   Hematocrit 35.3 (H) 61.4 - 46.6 %   MCV 97 79 - 97 fL   MCH 33.2 (H) 26.6 - 33.0 pg   MCHC 34.1 31 - 35 g/dL   RDW 43.1 54.0 - 08.6 %   Platelets 275 150 - 450 x10E3/uL   Neutrophils 58 Not Estab. %   Lymphs 26 Not Estab. %   Monocytes 12 Not Estab. %   Eos 3 Not Estab. %   Basos 1 Not Estab. %   Neutrophils Absolute 3.5 1 - 7 x10E3/uL   Lymphocytes Absolute 1.6 0 - 3 x10E3/uL   Monocytes Absolute 0.7 0 - 0 x10E3/uL   EOS (ABSOLUTE) 0.2 0.0 - 0.4 x10E3/uL   Basophils Absolute 0.1 0 - 0 x10E3/uL   Immature Granulocytes 0 Not Estab. %   Immature Grans (Abs) 0.0 0.0 - 0.1 x10E3/uL  Comprehensive metabolic panel  Result Value Ref Range   Glucose 100 (H) 65 - 99 mg/dL   BUN 13 6 - 24 mg/dL   Creatinine, Ser 7.61 (L) 0.57 - 1.00 mg/dL   GFR calc non Af Amer 107 >59 mL/min/1.73   GFR calc Af Amer 123 >59 mL/min/1.73   BUN/Creatinine Ratio 24 (H) 9 - 23   Sodium 137 134 - 144 mmol/L   Potassium 4.8 3.5 - 5.2 mmol/L   Chloride 99 96 - 106 mmol/L   CO2 24 20 - 29 mmol/L   Calcium 9.7 8.7 - 10.2 mg/dL   Total Protein 7.0 6.0 - 8.5 g/dL   Albumin 4.3 3.8 - 4.9 g/dL   Globulin, Total 2.7 1.5 - 4.5 g/dL   Albumin/Globulin Ratio 1.6 1.2 - 2.2   Bilirubin Total 0.3 0.0 - 1.2 mg/dL   Alkaline Phosphatase 131 (H) 44 - 121 IU/L   AST 17 0 - 40 IU/L   ALT 17 0 - 32 IU/L  Lipid Panel w/o Chol/HDL Ratio  Result Value Ref Range   Cholesterol, Total 208 (H) 100 - 199 mg/dL   Triglycerides 950 0 - 149 mg/dL   HDL 78 >93 mg/dL   VLDL Cholesterol Cal 19 5 - 40 mg/dL   LDL Chol Calc (NIH) 267 (H) 0 - 99 mg/dL  Microalbumin, Urine Waived  Result Value Ref Range  Microalb, Ur  Waived 30 (H) 0 - 19 mg/L   Creatinine, Urine Waived 200 10 - 300 mg/dL   Microalb/Creat Ratio <30 <30 mg/g  TSH  Result Value Ref Range   TSH 2.030 0.450 - 4.500 uIU/mL  Urinalysis, Routine w reflex microscopic  Result Value Ref Range   Specific Gravity, UA 1.025 1.005 - 1.030   pH, UA 5.0 5.0 - 7.5   Color, UA Yellow Yellow   Appearance Ur Clear Clear   Leukocytes,UA Negative Negative   Protein,UA Negative Negative/Trace   Glucose, UA Negative Negative   Ketones, UA Negative Negative   RBC, UA Negative Negative   Bilirubin, UA Negative Negative   Urobilinogen, Ur 0.2 0.2 - 1.0 mg/dL   Nitrite, UA Negative Negative  Hepatitis C Antibody  Result Value Ref Range   Hep C Virus Ab <0.1 0.0 - 0.9 s/co ratio      Assessment & Plan:   Problem List Items Addressed This Visit      Respiratory   COPD (chronic obstructive pulmonary disease) (HCC) - Primary    Not under great control. Will start spiriva. Continue symbicort. Recheck 1 month. Call with any concerns.       Relevant Medications   montelukast (SINGULAIR) 10 MG tablet   fluticasone (FLONASE) 50 MCG/ACT nasal spray   budesonide-formoterol (SYMBICORT) 160-4.5 MCG/ACT inhaler   albuterol (VENTOLIN HFA) 108 (90 Base) MCG/ACT inhaler   tiotropium (SPIRIVA HANDIHALER) 18 MCG inhalation capsule   Other Relevant Orders   CBC with Differential/Platelet (Completed)   Comprehensive metabolic panel (Completed)     Genitourinary   Benign hypertensive renal disease    Not under good control. Did not take her medicine today. Will restart and check next visit. Call with any concerns. Labs drawn today.       Relevant Orders   Comprehensive metabolic panel (Completed)   Microalbumin, Urine Waived (Completed)   TSH (Completed)   Urinalysis, Routine w reflex microscopic (Completed)     Other   Hyperlipidemia    Under good control on current regimen. Continue current regimen. Continue to monitor. Call with any concerns. Refills  given. Labs drawn today.        Relevant Medications   pravastatin (PRAVACHOL) 40 MG tablet   lisinopril (ZESTRIL) 20 MG tablet   Other Relevant Orders   Comprehensive metabolic panel (Completed)   Lipid Panel w/o Chol/HDL Ratio (Completed)   Polycythemia    Rechecking labs today. Await results.       Relevant Orders   CBC with Differential/Platelet (Completed)   Comprehensive metabolic panel (Completed)    Other Visit Diagnoses    Need for hepatitis C screening test       Labs drawn today. Await results.    Relevant Orders   Hepatitis C Antibody (Completed)   Encounter for screening mammogram for malignant neoplasm of breast       Mammogram today. Await results.    Relevant Orders   MM 3D SCREEN BREAST BILATERAL (Completed)       Follow up plan: Return in about 4 weeks (around 01/18/2020) for Physical.

## 2019-12-22 ENCOUNTER — Other Ambulatory Visit: Payer: Self-pay | Admitting: Family Medicine

## 2019-12-22 LAB — CBC WITH DIFFERENTIAL/PLATELET
Basophils Absolute: 0.1 10*3/uL (ref 0.0–0.2)
Basos: 1 %
EOS (ABSOLUTE): 0.2 10*3/uL (ref 0.0–0.4)
Eos: 3 %
Hematocrit: 50.7 % — ABNORMAL HIGH (ref 34.0–46.6)
Hemoglobin: 17.3 g/dL — ABNORMAL HIGH (ref 11.1–15.9)
Immature Grans (Abs): 0 10*3/uL (ref 0.0–0.1)
Immature Granulocytes: 0 %
Lymphocytes Absolute: 1.6 10*3/uL (ref 0.7–3.1)
Lymphs: 26 %
MCH: 33.2 pg — ABNORMAL HIGH (ref 26.6–33.0)
MCHC: 34.1 g/dL (ref 31.5–35.7)
MCV: 97 fL (ref 79–97)
Monocytes Absolute: 0.7 10*3/uL (ref 0.1–0.9)
Monocytes: 12 %
Neutrophils Absolute: 3.5 10*3/uL (ref 1.4–7.0)
Neutrophils: 58 %
Platelets: 275 10*3/uL (ref 150–450)
RBC: 5.21 x10E6/uL (ref 3.77–5.28)
RDW: 12.9 % (ref 11.7–15.4)
WBC: 6 10*3/uL (ref 3.4–10.8)

## 2019-12-22 LAB — COMPREHENSIVE METABOLIC PANEL
ALT: 17 IU/L (ref 0–32)
AST: 17 IU/L (ref 0–40)
Albumin/Globulin Ratio: 1.6 (ref 1.2–2.2)
Albumin: 4.3 g/dL (ref 3.8–4.9)
Alkaline Phosphatase: 131 IU/L — ABNORMAL HIGH (ref 44–121)
BUN/Creatinine Ratio: 24 — ABNORMAL HIGH (ref 9–23)
BUN: 13 mg/dL (ref 6–24)
Bilirubin Total: 0.3 mg/dL (ref 0.0–1.2)
CO2: 24 mmol/L (ref 20–29)
Calcium: 9.7 mg/dL (ref 8.7–10.2)
Chloride: 99 mmol/L (ref 96–106)
Creatinine, Ser: 0.55 mg/dL — ABNORMAL LOW (ref 0.57–1.00)
GFR calc Af Amer: 123 mL/min/{1.73_m2} (ref >59)
GFR calc non Af Amer: 107 mL/min/{1.73_m2} (ref >59)
Globulin, Total: 2.7 g/dL (ref 1.5–4.5)
Glucose: 100 mg/dL — ABNORMAL HIGH (ref 65–99)
Potassium: 4.8 mmol/L (ref 3.5–5.2)
Sodium: 137 mmol/L (ref 134–144)
Total Protein: 7 g/dL (ref 6.0–8.5)

## 2019-12-22 LAB — TSH: TSH: 2.03 u[IU]/mL (ref 0.450–4.500)

## 2019-12-22 LAB — LIPID PANEL W/O CHOL/HDL RATIO
Cholesterol, Total: 208 mg/dL — ABNORMAL HIGH (ref 100–199)
HDL: 78 mg/dL (ref >39)
LDL Chol Calc (NIH): 111 mg/dL — ABNORMAL HIGH (ref 0–99)
Triglycerides: 109 mg/dL (ref 0–149)
VLDL Cholesterol Cal: 19 mg/dL (ref 5–40)

## 2019-12-22 LAB — HEPATITIS C ANTIBODY: Hep C Virus Ab: <0.1 s/co ratio (ref 0.0–0.9)

## 2019-12-23 ENCOUNTER — Encounter: Payer: Self-pay | Admitting: Family Medicine

## 2019-12-25 NOTE — Assessment & Plan Note (Signed)
Not under good control. Did not take her medicine today. Will restart and check next visit. Call with any concerns. Labs drawn today.

## 2019-12-25 NOTE — Assessment & Plan Note (Signed)
Not under great control. Will start spiriva. Continue symbicort. Recheck 1 month. Call with any concerns.

## 2019-12-25 NOTE — Assessment & Plan Note (Signed)
Rechecking labs today. Await results.  

## 2019-12-25 NOTE — Assessment & Plan Note (Signed)
Under good control on current regimen. Continue current regimen. Continue to monitor. Call with any concerns. Refills given. Labs drawn today.   

## 2020-01-23 ENCOUNTER — Encounter: Payer: Medicaid Other | Admitting: Family Medicine

## 2020-05-07 ENCOUNTER — Other Ambulatory Visit: Payer: Self-pay | Admitting: Family Medicine

## 2020-07-23 ENCOUNTER — Other Ambulatory Visit: Payer: Self-pay | Admitting: Family Medicine

## 2020-07-23 NOTE — Telephone Encounter (Signed)
Requested medication (s) are due for refill today: no  Requested medication (s) are on the active medication list: yes  Last refill: 02/17/2020  Future visit scheduled: no  Notes to clinic: Patient canceled physical appointment on 01/23/2020 ' Another appt has not been scheduled     Requested Prescriptions  Pending Prescriptions Disp Refills   gabapentin (NEURONTIN) 400 MG capsule [Pharmacy Med Name: GABAPENTIN 400 MG CAPSULE] 180 capsule 1    Sig: TAKE 1 CAPSULE BY MOUTH 3 TIMES DAILY.      Neurology: Anticonvulsants - gabapentin Passed - 07/23/2020  2:03 PM      Passed - Valid encounter within last 12 months    Recent Outpatient Visits           7 months ago Simple chronic bronchitis (HCC)   [facility] [place], Megan P, DO   1 year ago Moderate persistent asthma with acute exacerbation   [facility] [place], Megan P, DO   1 year ago Obesity due to excess calories with serious comorbidity, unspecified classification   [facility] [place], Megan P, DO   2 years ago Contusion of left knee, subsequent encounter   [facility] [place], [place]   2 years ago Benign hypertensive renal disease   [facility] [place], [place], DO

## 2020-07-23 NOTE — Telephone Encounter (Signed)
Pt had cpe scheduled in October but was cancelled. Called pt to r/s no answer left vm

## 2020-08-10 ENCOUNTER — Other Ambulatory Visit: Payer: Self-pay | Admitting: Family Medicine

## 2020-08-10 NOTE — Telephone Encounter (Signed)
Pt has apt on 08/14/2020 states she can make it over the weekend with out her inhaler

## 2020-08-10 NOTE — Telephone Encounter (Signed)
Requested medication (s) are due for refill today: no  Requested medication (s) are on the active medication list: yes  Last refill:  07/23/2020  Future visit scheduled: no  Notes to clinic:  Patient is due for a follow up appointment  Left vm for patient to callback   Requested Prescriptions  Pending Prescriptions Disp Refills   pravastatin (PRAVACHOL) 40 MG tablet [Pharmacy Med Name: PRAVASTATIN SODIUM 40 MG TAB] 90 tablet 1    Sig: TAKE 1 TABLET BY MOUTH EVERY DAY      Cardiovascular:  Antilipid - Statins Failed - 08/10/2020  1:38 PM      Failed - Total Cholesterol in normal range and within 360 days    Cholesterol, Total  Date Value Ref Range Status  12/21/2019 208 (H) 100 - 199 mg/dL Final   Cholesterol Piccolo, Waived  Date Value Ref Range Status  01/19/2015 221 (H) <200 mg/dL Final    Comment:                            Desirable                <200                         Borderline High      200- 239                         High                     >239           Failed - LDL in normal range and within 360 days    LDL Chol Calc (NIH)  Date Value Ref Range Status  12/21/2019 111 (H) 0 - 99 mg/dL Final          Passed - HDL in normal range and within 360 days    HDL  Date Value Ref Range Status  12/21/2019 78 >39 mg/dL Final          Passed - Triglycerides in normal range and within 360 days    Triglycerides  Date Value Ref Range Status  12/21/2019 109 0 - 149 mg/dL Final   Triglycerides Piccolo,Waived  Date Value Ref Range Status  01/19/2015 91 <150 mg/dL Final    Comment:                            Normal                   <150                         Borderline High     150 - 199                         High                200 - 499                         Very High                >499           Passed - Patient is not pregnant  Passed - Valid encounter within last 12 months    Recent Outpatient Visits           7 months ago Simple  chronic bronchitis (HCC)   [facility] [place], Megan P, DO   1 year ago Moderate persistent asthma with acute exacerbation   [facility] [place], Megan P, DO   1 year ago Obesity due to excess calories with serious comorbidity, unspecified classification   [facility] [place], Megan P, DO   2 years ago Contusion of left knee, subsequent encounter   [facility] Particia [place], [place]   2 years ago Benign hypertensive renal disease   [facility] [place], Megan P, DO                  PROAIR HFA 108 [phone] Base) MCG/ACT inhaler [Pharmacy Med Name: PROAIR HFA 90 MCG INHALER] 8.5 each 11    Sig: TAKE 2 PUFFS BY MOUTH EVERY 4 HOURS AS NEEDED      Pulmonology:  Beta Agonists Failed - 08/10/2020  1:38 PM      Failed - One inhaler should last at least one month. If the patient is requesting refills earlier, contact the patient to check for uncontrolled symptoms.      Passed - Valid encounter within last 12 months    Recent Outpatient Visits           7 months ago Simple chronic bronchitis (HCC)   [facility] [place], Megan P, DO   1 year ago Moderate persistent asthma with acute exacerbation   [facility] [place], Megan P, DO   1 year ago Obesity due to excess calories with serious comorbidity, unspecified classification   [facility] [place], Megan P, DO   2 years ago Contusion of left knee, subsequent encounter   [facility] Particia [place], [place]   2 years ago Benign hypertensive renal disease   [facility] [place], [place], [place]

## 2020-08-14 ENCOUNTER — Ambulatory Visit: Payer: Medicaid Other | Admitting: Family Medicine

## 2020-08-22 ENCOUNTER — Ambulatory Visit (INDEPENDENT_AMBULATORY_CARE_PROVIDER_SITE_OTHER): Payer: Medicaid Other | Admitting: Family Medicine

## 2020-08-22 ENCOUNTER — Other Ambulatory Visit: Payer: Self-pay

## 2020-08-22 ENCOUNTER — Encounter: Payer: Self-pay | Admitting: Family Medicine

## 2020-08-22 VITALS — BP 127/84 | HR 93 | Wt 224.2 lb

## 2020-08-22 DIAGNOSIS — D751 Secondary polycythemia: Secondary | ICD-10-CM

## 2020-08-22 DIAGNOSIS — J4540 Moderate persistent asthma, uncomplicated: Secondary | ICD-10-CM | POA: Diagnosis not present

## 2020-08-22 DIAGNOSIS — E782 Mixed hyperlipidemia: Secondary | ICD-10-CM

## 2020-08-22 DIAGNOSIS — J410 Simple chronic bronchitis: Secondary | ICD-10-CM

## 2020-08-22 DIAGNOSIS — I129 Hypertensive chronic kidney disease with stage 1 through stage 4 chronic kidney disease, or unspecified chronic kidney disease: Secondary | ICD-10-CM

## 2020-08-22 LAB — MICROALBUMIN, URINE WAIVED
Creatinine, Urine Waived: 200 mg/dL (ref 10–300)
Microalb, Ur Waived: 10 mg/L (ref 0–19)
Microalb/Creat Ratio: <30 mg/g (ref <30)

## 2020-08-22 MED ORDER — ALBUTEROL SULFATE (2.5 MG/3ML) 0.083% IN NEBU: 2.5000 mg | INHALATION_SOLUTION | RESPIRATORY_TRACT | 6 refills | Status: DC | PRN

## 2020-08-22 MED ORDER — PRAVASTATIN SODIUM 40 MG PO TABS: 40.0000 mg | ORAL_TABLET | Freq: Every day | ORAL | 1 refills | Status: DC

## 2020-08-22 MED ORDER — NAPROXEN 500 MG PO TABS: 500.0000 mg | ORAL_TABLET | Freq: Two times a day (BID) | ORAL | 1 refills | Status: DC

## 2020-08-22 MED ORDER — FLUTICASONE PROPIONATE 50 MCG/ACT NA SUSP: NASAL | 6 refills | Status: DC

## 2020-08-22 MED ORDER — OMEPRAZOLE 20 MG PO CPDR: DELAYED_RELEASE_CAPSULE | ORAL | 1 refills | Status: DC

## 2020-08-22 MED ORDER — LISINOPRIL 20 MG PO TABS: 1.0000 | ORAL_TABLET | Freq: Every day | ORAL | 1 refills | Status: DC

## 2020-08-22 MED ORDER — ALBUTEROL SULFATE HFA 108 (90 BASE) MCG/ACT IN AERS: INHALATION_SPRAY | RESPIRATORY_TRACT | 6 refills | Status: DC

## 2020-08-22 MED ORDER — GABAPENTIN 400 MG PO CAPS: 400.0000 mg | ORAL_CAPSULE | Freq: Three times a day (TID) | ORAL | 1 refills | Status: DC

## 2020-08-22 MED ORDER — BUDESONIDE-FORMOTEROL FUMARATE 160-4.5 MCG/ACT IN AERO: 2.0000 | INHALATION_SPRAY | Freq: Two times a day (BID) | RESPIRATORY_TRACT | 12 refills | Status: DC

## 2020-08-22 MED ORDER — MONTELUKAST SODIUM 10 MG PO TABS: 10.0000 mg | ORAL_TABLET | Freq: Every day | ORAL | 1 refills | Status: DC

## 2020-08-22 NOTE — Assessment & Plan Note (Signed)
Could not tolerate spiriva. Will try breztri- if she likes it will stop symbicort and change over. Sample given today.  

## 2020-08-22 NOTE — Assessment & Plan Note (Signed)
Under good control on current regimen. Continue current regimen. Continue to monitor. Call with any concerns. Refills given. Labs drawn today.   

## 2020-08-22 NOTE — Assessment & Plan Note (Signed)
Rechecking labs today. Await results. Treat as needed.  °

## 2020-08-22 NOTE — Assessment & Plan Note (Signed)
Could not tolerate spiriva. Will try breztri- if she likes it will stop symbicort and change over. Sample given today.

## 2020-08-22 NOTE — Progress Notes (Signed)
BP 127/84   Pulse 93   Wt 224 lb 3.2 oz (101.7 kg)   LMP 08/02/2017 (Exact Date)   SpO2 96%   BMI 42.36 kg/m    Subjective:    Patient ID: Nancy Stout, female    DOB: [birth year], 55 y.o.   MRN: [id number]  HPI: Nancy Stout is a 55 y.o. female  Chief Complaint  Patient presents with  . Hyperlipidemia  . Hypertension  . Asthma   HYPERTENSION / HYPERLIPIDEMIA Satisfied with current treatment? yes Duration of hypertension: chronic BP monitoring frequency: not checking BP medication side effects: no Past BP meds: lisinopril Duration of hyperlipidemia: chronic Cholesterol medication side effects: no Cholesterol supplements: none Past cholesterol medications: pravastatin Medication compliance: excellent compliance Aspirin: no Recent stressors: no Recurrent headaches: no Visual changes: no Palpitations: no Dyspnea: no Chest pain: no Lower extremity edema: no Dizzy/lightheaded: no  COPD COPD status: stable Satisfied with current treatment?: yes Oxygen use: no Dyspnea frequency: daily Cough frequency: daily Rescue inhaler frequency: 2x  daily  Limitation of activity: yes Productive cough: no Pneumovax: Up to Date Influenza: Not up to Date  Relevant past medical, surgical, family and social history reviewed and updated as indicated. Interim medical history since our last visit reviewed. Allergies and medications reviewed and updated.  Review of Systems  Constitutional: Negative.   Respiratory: Negative.   Cardiovascular: Negative.   Gastrointestinal: Negative.   Musculoskeletal: Negative.   Neurological: Negative.   Psychiatric/Behavioral: Negative.     Per HPI unless specifically indicated above     Objective:    BP 127/84   Pulse 93   Wt 224 lb 3.2 oz (101.7 kg)   LMP 08/02/2017 (Exact Date)   SpO2 96%   BMI 42.36 kg/m   Wt Readings from Last 3 Encounters:  08/22/20 224 lb 3.2 oz (101.7 kg)  12/21/19 217 lb (98.4 kg)  01/20/19 205  lb 12.8 oz (93.4 kg)    Physical Exam Vitals and nursing note reviewed.  Constitutional:      General: She is not in acute distress.    Appearance: Normal appearance. She is not ill-appearing, toxic-appearing or diaphoretic.  HENT:     Head: Normocephalic and atraumatic.     Right Ear: External ear normal.     Left Ear: External ear normal.     Nose: Nose normal.     Mouth/Throat:     Mouth: Mucous membranes are moist.     Pharynx: Oropharynx is clear.  Eyes:     General: No scleral icterus.       Right eye: No discharge.        Left eye: No discharge.     Extraocular Movements: Extraocular movements intact.     Conjunctiva/sclera: Conjunctivae normal.     Pupils: Pupils are equal, round, and reactive to light.  Cardiovascular:     Rate and Rhythm: Normal rate and regular rhythm.     Pulses: Normal pulses.     Heart sounds: Normal heart sounds. No murmur heard. No friction rub. No gallop.   Pulmonary:     Effort: Pulmonary effort is normal. No respiratory distress.     Breath sounds: No stridor. Wheezing present. No rhonchi or rales.  Chest:     Chest wall: No tenderness.  Musculoskeletal:        General: Normal range of motion.     Cervical back: Normal range of motion and neck supple.  Skin:    General: Skin is warm and dry.  Capillary Refill: Capillary refill takes less than 2 seconds.     Coloration: Skin is not jaundiced or pale.     Findings: No bruising, erythema, lesion or rash.  Neurological:     General: No focal deficit present.     Mental Status: She is alert and oriented to person, place, and time. Mental status is at baseline.  Psychiatric:        Mood and Affect: Mood normal.        Behavior: Behavior normal.        Thought Content: Thought content normal.        Judgment: Judgment normal.     Results for orders placed or performed in visit on 12/21/19  CBC with Differential/Platelet  Result Value Ref Range   WBC 6.0 3.4 - 10.8 x10E3/uL   RBC  5.21 3.77 - 5.28 x10E6/uL   Hemoglobin 17.3 (H) 11.1 - 15.9 g/dL   Hematocrit 26.3 (H) 78.5 - 46.6 %   MCV 97 79 - 97 fL   MCH 33.2 (H) 26.6 - 33.0 pg   MCHC 34.1 31.5 - 35.7 g/dL   RDW 88.5 02.7 - 74.1 %   Platelets 275 150 - 450 x10E3/uL   Neutrophils 58 Not Estab. %   Lymphs 26 Not Estab. %   Monocytes 12 Not Estab. %   Eos 3 Not Estab. %   Basos 1 Not Estab. %   Neutrophils Absolute 3.5 1.4 - 7.0 x10E3/uL   Lymphocytes Absolute 1.6 0.7 - 3.1 x10E3/uL   Monocytes Absolute 0.7 0.1 - 0.9 x10E3/uL   EOS (ABSOLUTE) 0.2 0.0 - 0.4 x10E3/uL   Basophils Absolute 0.1 0.0 - 0.2 x10E3/uL   Immature Granulocytes 0 Not Estab. %   Immature Grans (Abs) 0.0 0.0 - 0.1 x10E3/uL  Comprehensive metabolic panel  Result Value Ref Range   Glucose 100 (H) 65 - 99 mg/dL   BUN 13 6 - 24 mg/dL   Creatinine, Ser 2.87 (L) 0.57 - 1.00 mg/dL   GFR calc non Af Amer 107 >59 mL/min/1.73   GFR calc Af Amer 123 >59 mL/min/1.73   BUN/Creatinine Ratio 24 (H) 9 - 23   Sodium 137 134 - 144 mmol/L   Potassium 4.8 3.5 - 5.2 mmol/L   Chloride 99 96 - 106 mmol/L   CO2 24 20 - 29 mmol/L   Calcium 9.7 8.7 - 10.2 mg/dL   Total Protein 7.0 6.0 - 8.5 g/dL   Albumin 4.3 3.8 - 4.9 g/dL   Globulin, Total 2.7 1.5 - 4.5 g/dL   Albumin/Globulin Ratio 1.6 1.2 - 2.2   Bilirubin Total 0.3 0.0 - 1.2 mg/dL   Alkaline Phosphatase 131 (H) 44 - 121 IU/L   AST 17 0 - 40 IU/L   ALT 17 0 - 32 IU/L  Lipid Panel w/o Chol/HDL Ratio  Result Value Ref Range   Cholesterol, Total 208 (H) 100 - 199 mg/dL   Triglycerides 867 0 - 149 mg/dL   HDL 78 >67 mg/dL   VLDL Cholesterol Cal 19 5 - 40 mg/dL   LDL Chol Calc (NIH) 209 (H) 0 - 99 mg/dL  Microalbumin, Urine Waived  Result Value Ref Range   Microalb, Ur Waived 30 (H) 0 - 19 mg/L   Creatinine, Urine Waived 200 10 - 300 mg/dL   Microalb/Creat Ratio <30 <30 mg/g  TSH  Result Value Ref Range   TSH 2.030 0.450 - 4.500 uIU/mL  Urinalysis, Routine w reflex microscopic  Result Value Ref  Range  Specific Gravity, UA 1.025 1.005 - 1.030   pH, UA 5.0 5.0 - 7.5   Color, UA Yellow Yellow   Appearance Ur Clear Clear   Leukocytes,UA Negative Negative   Protein,UA Negative Negative/Trace   Glucose, UA Negative Negative   Ketones, UA Negative Negative   RBC, UA Negative Negative   Bilirubin, UA Negative Negative   Urobilinogen, Ur 0.2 0.2 - 1.0 mg/dL   Nitrite, UA Negative Negative  Hepatitis C Antibody  Result Value Ref Range   Hep C Virus Ab <0.1 0.0 - 0.9 s/co ratio      Assessment & Plan:   Problem List Items Addressed This Visit      Respiratory   Asthma    Could not tolerate spiriva. Will try breztri- if she likes it will stop symbicort and change over. Sample given today.       Relevant Medications   albuterol (PROAIR HFA) 108 (90 Base) MCG/ACT inhaler   albuterol (PROVENTIL) (2.5 MG/3ML) 0.083% nebulizer solution   budesonide-formoterol (SYMBICORT) 160-4.5 MCG/ACT inhaler   montelukast (SINGULAIR) 10 MG tablet   COPD (chronic obstructive pulmonary disease) (HCC) - Primary    Could not tolerate spiriva. Will try breztri- if she likes it will stop symbicort and change over. Sample given today.       Relevant Medications   albuterol (PROAIR HFA) 108 (90 Base) MCG/ACT inhaler   albuterol (PROVENTIL) (2.5 MG/3ML) 0.083% nebulizer solution   budesonide-formoterol (SYMBICORT) 160-4.5 MCG/ACT inhaler   fluticasone (FLONASE) 50 MCG/ACT nasal spray   montelukast (SINGULAIR) 10 MG tablet   Other Relevant Orders   Comprehensive metabolic panel     Genitourinary   Benign hypertensive renal disease    Under good control on current regimen. Continue current regimen. Continue to monitor. Call with any concerns. Refills given. Labs drawn today.        Relevant Orders   Comprehensive metabolic panel   Microalbumin, Urine Waived     Other   Hyperlipidemia    Under good control on current regimen. Continue current regimen. Continue to monitor. Call with any  concerns. Refills given. Labs drawn today.        Relevant Medications   lisinopril (ZESTRIL) 20 MG tablet   pravastatin (PRAVACHOL) 40 MG tablet   Other Relevant Orders   Comprehensive metabolic panel   Lipid Panel Piccolo, Waived   Polycythemia    Rechecking labs today. Await results. Treat as needed.       Relevant Orders   CBC with Differential/Platelet   Comprehensive metabolic panel       Follow up plan: Return in about 6 months (around 02/22/2021) for physical.

## 2020-08-23 LAB — COMPREHENSIVE METABOLIC PANEL
ALT: 22 IU/L (ref 0–32)
AST: 21 IU/L (ref 0–40)
Albumin/Globulin Ratio: 1.7 (ref 1.2–2.2)
Albumin: 4.3 g/dL (ref 3.8–4.9)
Alkaline Phosphatase: 122 IU/L — ABNORMAL HIGH (ref 44–121)
BUN/Creatinine Ratio: 24 — ABNORMAL HIGH (ref 9–23)
BUN: 15 mg/dL (ref 6–24)
Bilirubin Total: 0.3 mg/dL (ref 0.0–1.2)
CO2: 25 mmol/L (ref 20–29)
Calcium: 10 mg/dL (ref 8.7–10.2)
Chloride: 99 mmol/L (ref 96–106)
Creatinine, Ser: 0.63 mg/dL (ref 0.57–1.00)
Globulin, Total: 2.6 g/dL (ref 1.5–4.5)
Glucose: 103 mg/dL — ABNORMAL HIGH (ref 65–99)
Potassium: 5.2 mmol/L (ref 3.5–5.2)
Sodium: 139 mmol/L (ref 134–144)
Total Protein: 6.9 g/dL (ref 6.0–8.5)
eGFR: 105 mL/min/{1.73_m2} (ref >59)

## 2020-08-23 LAB — CBC WITH DIFFERENTIAL/PLATELET
Basophils Absolute: 0.1 10*3/uL (ref 0.0–0.2)
Basos: 1 %
EOS (ABSOLUTE): 0.3 10*3/uL (ref 0.0–0.4)
Eos: 4 %
Hematocrit: 52.6 % — ABNORMAL HIGH (ref 34.0–46.6)
Hemoglobin: 17.9 g/dL — ABNORMAL HIGH (ref 11.1–15.9)
Immature Grans (Abs): 0 10*3/uL (ref 0.0–0.1)
Immature Granulocytes: 0 %
Lymphocytes Absolute: 1.9 10*3/uL (ref 0.7–3.1)
Lymphs: 30 %
MCH: 33.1 pg — ABNORMAL HIGH (ref 26.6–33.0)
MCHC: 34 g/dL (ref 31.5–35.7)
MCV: 97 fL (ref 79–97)
Monocytes Absolute: 0.6 10*3/uL (ref 0.1–0.9)
Monocytes: 9 %
Neutrophils Absolute: 3.6 10*3/uL (ref 1.4–7.0)
Neutrophils: 56 %
Platelets: 270 10*3/uL (ref 150–450)
RBC: 5.41 x10E6/uL — ABNORMAL HIGH (ref 3.77–5.28)
RDW: 12.6 % (ref 11.7–15.4)
WBC: 6.4 10*3/uL (ref 3.4–10.8)

## 2020-08-24 ENCOUNTER — Other Ambulatory Visit: Payer: Self-pay | Admitting: Family Medicine

## 2020-08-24 LAB — SPECIMEN STATUS REPORT

## 2020-08-24 LAB — LIPID PANEL W/O CHOL/HDL RATIO
Cholesterol, Total: 247 mg/dL — ABNORMAL HIGH (ref 100–199)
HDL: 65 mg/dL (ref >39)
LDL Chol Calc (NIH): 162 mg/dL — ABNORMAL HIGH (ref 0–99)
Triglycerides: 116 mg/dL (ref 0–149)
VLDL Cholesterol Cal: 20 mg/dL (ref 5–40)

## 2020-08-24 MED ORDER — PRAVASTATIN SODIUM 80 MG PO TABS: 80.0000 mg | ORAL_TABLET | Freq: Every day | ORAL | 1 refills | Status: DC

## 2020-09-11 ENCOUNTER — Other Ambulatory Visit: Payer: Self-pay

## 2020-09-11 ENCOUNTER — Ambulatory Visit (INDEPENDENT_AMBULATORY_CARE_PROVIDER_SITE_OTHER): Payer: Medicaid Other | Admitting: Internal Medicine

## 2020-09-11 ENCOUNTER — Encounter: Payer: Self-pay | Admitting: Internal Medicine

## 2020-09-11 VITALS — BP 129/77 | HR 84 | Temp 98.4°F | Ht 60.0 in | Wt 229.0 lb

## 2020-09-11 DIAGNOSIS — T7840XA Allergy, unspecified, initial encounter: Secondary | ICD-10-CM | POA: Diagnosis not present

## 2020-09-11 DIAGNOSIS — L509 Urticaria, unspecified: Secondary | ICD-10-CM | POA: Diagnosis not present

## 2020-09-11 DIAGNOSIS — R21 Rash and other nonspecific skin eruption: Secondary | ICD-10-CM | POA: Diagnosis not present

## 2020-09-11 MED ORDER — METHYLPREDNISOLONE 4 MG PO TBPK: ORAL_TABLET | ORAL | 0 refills | Status: DC

## 2020-09-11 MED ORDER — METHYLPREDNISOLONE SODIUM SUCC 40 MG IJ SOLR
80.0000 mg | Freq: Once | INTRAMUSCULAR | Status: AC
  Administered 2020-09-11: 80 mg via INTRAMUSCULAR

## 2020-09-11 NOTE — Progress Notes (Signed)
BP 129/77   Pulse 84   Temp 98.4 F (36.9 C) (Oral)   Ht 5' (1.524 m)   Wt 229 lb (103.9 kg)   LMP 08/02/2017 (Exact Date)   SpO2 97%   BMI 44.72 kg/m    Subjective:    Patient ID: Nancy Stout, female    DOB: [birth year], 55 y.o.   MRN: [id number]  Chief Complaint  Patient presents with  . Rash    Patient states that she was working outside for the past few days andeveloped a rash on her B/L thighs and B/L sides    HPI: Nancy Stout is a 55 y.o. female  Rash This is a new problem. The current episode started in the past 7 days (x 3 days ago, itching and burning on the bil thighs and under the breasts bilaterally ). The rash is characterized by redness and itchiness. Pertinent negatives include no congestion, cough, diarrhea, facial edema, fatigue, fever, joint pain, rhinorrhea, shortness of breath or sore throat.    Chief Complaint  Patient presents with  . Rash    Patient states that she was working outside for the past few days andeveloped a rash on her B/L thighs and B/L sides    Relevant past medical, surgical, family and social history reviewed and updated as indicated. Interim medical history since our last visit reviewed. Allergies and medications reviewed and updated.  Review of Systems  Constitutional: Negative for fatigue and fever.  HENT: Negative for congestion, rhinorrhea and sore throat.   Respiratory: Negative for cough and shortness of breath.   Gastrointestinal: Negative for diarrhea.  Musculoskeletal: Negative for joint pain.  Skin: Positive for rash.    Per HPI unless specifically indicated above     Objective:    BP 129/77   Pulse 84   Temp 98.4 F (36.9 C) (Oral)   Ht 5' (1.524 m)   Wt 229 lb (103.9 kg)   LMP 08/02/2017 (Exact Date)   SpO2 97%   BMI 44.72 kg/m   Wt Readings from Last 3 Encounters:  09/11/20 229 lb (103.9 kg)  08/22/20 224 lb 3.2 oz (101.7 kg)  12/21/19 217 lb (98.4 kg)    Physical Exam Constitutional:       General: She is not in acute distress.    Appearance: Normal appearance. She is obese. She is not ill-appearing or diaphoretic.  Pulmonary:     Effort: No respiratory distress.     Breath sounds: No wheezing.  Skin:    Findings: Erythema, lesion and rash present.     Comments: Hives noted on the bil iner thighs/ under breasts bilaterlaly.   Neurological:     Mental Status: She is alert.     Results for orders placed or performed in visit on 08/22/20  CBC with Differential/Platelet  Result Value Ref Range   WBC 6.4 3.4 - 10.8 x10E3/uL   RBC 5.41 (H) 3.77 - 5.28 x10E6/uL   Hemoglobin 17.9 (H) 11.1 - 15.9 g/dL   Hematocrit 52.6 (H) 34.0 - 46.6 %   MCV 97 79 - 97 fL   MCH 33.1 (H) 26.6 - 33.0 pg   MCHC 34.0 31.5 - 35.7 g/dL   RDW 12.6 11.7 - 15.4 %   Platelets 270 150 - 450 x10E3/uL   Neutrophils 56 Not Estab. %   Lymphs 30 Not Estab. %   Monocytes 9 Not Estab. %   Eos 4 Not Estab. %   Basos 1 Not Estab. %  Neutrophils Absolute 3.6 1.4 - 7.0 x10E3/uL   Lymphocytes Absolute 1.9 0.7 - 3.1 x10E3/uL   Monocytes Absolute 0.6 0.1 - 0.9 x10E3/uL   EOS (ABSOLUTE) 0.3 0.0 - 0.4 x10E3/uL   Basophils Absolute 0.1 0.0 - 0.2 x10E3/uL   Immature Granulocytes 0 Not Estab. %   Immature Grans (Abs) 0.0 0.0 - 0.1 x10E3/uL  Comprehensive metabolic panel  Result Value Ref Range   Glucose 103 (H) 65 - 99 mg/dL   BUN 15 6 - 24 mg/dL   Creatinine, Ser 0.63 0.57 - 1.00 mg/dL   eGFR 105 >59 mL/min/1.73   BUN/Creatinine Ratio 24 (H) 9 - 23   Sodium 139 134 - 144 mmol/L   Potassium 5.2 3.5 - 5.2 mmol/L   Chloride 99 96 - 106 mmol/L   CO2 25 20 - 29 mmol/L   Calcium 10.0 8.7 - 10.2 mg/dL   Total Protein 6.9 6.0 - 8.5 g/dL   Albumin 4.3 3.8 - 4.9 g/dL   Globulin, Total 2.6 1.5 - 4.5 g/dL   Albumin/Globulin Ratio 1.7 1.2 - 2.2   Bilirubin Total 0.3 0.0 - 1.2 mg/dL   Alkaline Phosphatase 122 (H) 44 - 121 IU/L   AST 21 0 - 40 IU/L   ALT 22 0 - 32 IU/L  Microalbumin, Urine Waived  Result  Value Ref Range   Microalb, Ur Waived 10 0 - 19 mg/L   Creatinine, Urine Waived 200 10 - 300 mg/dL   Microalb/Creat Ratio <30 <30 mg/g  Lipid Panel w/o Chol/HDL Ratio  Result Value Ref Range   Cholesterol, Total 247 (H) 100 - 199 mg/dL   Triglycerides 116 0 - 149 mg/dL   HDL 65 >39 mg/dL   VLDL Cholesterol Cal 20 5 - 40 mg/dL   LDL Chol Calc (NIH) 162 (H) 0 - 99 mg/dL  Specimen status report  Result Value Ref Range   specimen status report Comment         Current Outpatient Medications:  .  albuterol (PROAIR HFA) 108 (90 Base) MCG/ACT inhaler, TAKE 2 PUFFS BY MOUTH EVERY 4 HOURS AS NEEDED, Disp: 18 each, Rfl: 6 .  albuterol (PROVENTIL) (2.5 MG/3ML) 0.083% nebulizer solution, Take 3 mLs (2.5 mg total) by nebulization every 4 (four) hours as needed for wheezing or shortness of breath., Disp: 450 mL, Rfl: 6 .  budesonide-formoterol (SYMBICORT) 160-4.5 MCG/ACT inhaler, Inhale 2 puffs into the lungs 2 (two) times daily., Disp: 1 each, Rfl: 12 .  DiphenhydrAMINE HCl (ALLERGY MED PO), Take by mouth daily., Disp: , Rfl:  .  fluticasone (FLONASE) 50 MCG/ACT nasal spray, SPRAY 2 SPRAYS INTO EACH NOSTRIL EVERY DAY, Disp: 16 mL, Rfl: 6 .  gabapentin (NEURONTIN) 400 MG capsule, Take 1 capsule (400 mg total) by mouth 3 (three) times daily., Disp: 270 capsule, Rfl: 1 .  Glucosamine-Chondroitin (JOINT PAIN FORMULA PO), Take by mouth as needed., Disp: , Rfl:  .  lisinopril (ZESTRIL) 20 MG tablet, Take 1 tablet (20 mg total) by mouth daily., Disp: 90 tablet, Rfl: 1 .  methylPREDNISolone (MEDROL DOSEPAK) 4 MG TBPK tablet, Use as directed, Disp: 1 each, Rfl: 0 .  montelukast (SINGULAIR) 10 MG tablet, Take 1 tablet (10 mg total) by mouth at bedtime., Disp: 90 tablet, Rfl: 1 .  naproxen (NAPROSYN) 500 MG tablet, Take 1 tablet (500 mg total) by mouth 2 (two) times daily with a meal., Disp: 180 tablet, Rfl: 1 .  omeprazole (PRILOSEC) 20 MG capsule, TAKE 1 CAPSULE BY MOUTH EVERY DAY, Disp:  90 capsule, Rfl: 1 .   pravastatin (PRAVACHOL) 80 MG tablet, Take 1 tablet (80 mg total) by mouth daily., Disp: 90 tablet, Rfl: 1    Assessment & Plan:  1. Rash / hives sec to allergens in yard/ garden. To use calamine lotion conitnue antihistamines q pm for such  Start pt on medrol dose pak  Wheezing per baseline  conitnue albuterol for such  80 mg solumdrol x 1 administered in the office today.    Problem List Items Addressed This Visit   None   Visit Diagnoses    Rash due to allergy    -  Primary   Relevant Medications   methylPREDNISolone sodium succinate (SOLU-MEDROL) 40 mg/mL injection 80 mg (Completed)   Hives of unknown origin       Relevant Medications   methylPREDNISolone sodium succinate (SOLU-MEDROL) 40 mg/mL injection 80 mg (Completed)       Follow up plan: No follow-ups on file.

## 2020-09-17 ENCOUNTER — Telehealth: Payer: Self-pay | Admitting: Family Medicine

## 2020-09-17 NOTE — Telephone Encounter (Signed)
Pt is calling to let dr Charlotta Newton know she did not take her zpak abx correctly. Pt pick up zpak on 09-12-2020 and on 09-13-2020 she took medication correctly and on 09-14-2020 after lunch time she did not take medication anymore. PT believes the chole med is causing hives . Pt would like to know if md will call in another zpak due to hives. [facility] [address] in [place] phone number [phone]

## 2020-09-18 ENCOUNTER — Encounter: Payer: Self-pay | Admitting: Nurse Practitioner

## 2020-09-18 NOTE — Telephone Encounter (Signed)
Patient notified

## 2020-09-18 NOTE — Telephone Encounter (Signed)
Pt called back. °

## 2020-09-18 NOTE — Telephone Encounter (Signed)
Patient is returning a call to Destiny.  Please call patient back.

## 2020-09-18 NOTE — Telephone Encounter (Signed)
Called patient to answer any additional questions regarding medications. Left message advising patient to give our office a call back to speak with any clinical staff regarding previous message.

## 2020-09-18 NOTE — Telephone Encounter (Signed)
Patient has made an additional call requesting to speak with a member of clinical staff  Please contact when possible

## 2020-09-18 NOTE — Telephone Encounter (Signed)
Called and spoke with patient, she states that she has been missing her cholesterol medication. Was restarted and now she is having hives, she thinks that this may be related to the medication.  She did not take the medication yesterday or today and now the hives are clearing up.   Please advise

## 2020-11-27 ENCOUNTER — Other Ambulatory Visit: Payer: Self-pay | Admitting: Family Medicine

## 2020-11-27 NOTE — Telephone Encounter (Signed)
Requested medication (s) are on the active medication list: yes  Last refill:  08/10/2020  Future visit scheduled:  yes  Notes to clinic:  Failed protocol:  One inhaler should last at least one month. If the patient is requesting refills earlier, contact the patient to check for uncontrolled symptoms  Requested Prescriptions  Pending Prescriptions Disp Refills   PROAIR HFA 108 (90 Base) MCG/ACT inhaler [Pharmacy Med Name: PROAIR HFA 90 MCG INHALER] 8.5 each     Sig: INHALE 2 PUFFS BY MOUTH EVERY 4 HOURS AS NEEDED     Pulmonology:  Beta Agonists Failed - 11/27/2020 11:30 AM      Failed - One inhaler should last at least one month. If the patient is requesting refills earlier, contact the patient to check for uncontrolled symptoms.      Passed - Valid encounter within last 12 months    Recent Outpatient Visits           2 months ago Rash due to allergy   [facility] [place], Avanti, MD   3 months ago Simple chronic bronchitis (HCC)   [facility] [place], Megan P, DO   11 months ago Simple chronic bronchitis (HCC)   [facility] [place], Megan P, DO   1 year ago Moderate persistent asthma with acute exacerbation   [facility] [place], Megan P, DO   2 years ago Obesity due to excess calories with serious comorbidity, unspecified classification   [facility] Dorcas [place], DO       Future Appointments             In 3 months Laural Benes, Oralia Rud, DO [facility]

## 2021-01-04 ENCOUNTER — Other Ambulatory Visit: Payer: Self-pay | Admitting: Nurse Practitioner

## 2021-01-07 ENCOUNTER — Ambulatory Visit (INDEPENDENT_AMBULATORY_CARE_PROVIDER_SITE_OTHER): Payer: Medicaid Other | Admitting: Nurse Practitioner

## 2021-01-07 ENCOUNTER — Encounter: Payer: Self-pay | Admitting: Nurse Practitioner

## 2021-01-07 ENCOUNTER — Ambulatory Visit: Payer: Medicaid Other | Admitting: Nurse Practitioner

## 2021-01-07 ENCOUNTER — Other Ambulatory Visit: Payer: Self-pay

## 2021-01-07 VITALS — BP 110/69 | HR 100 | Ht 60.0 in | Wt 229.0 lb

## 2021-01-07 DIAGNOSIS — L509 Urticaria, unspecified: Secondary | ICD-10-CM | POA: Diagnosis not present

## 2021-01-07 DIAGNOSIS — R768 Other specified abnormal immunological findings in serum: Secondary | ICD-10-CM | POA: Diagnosis not present

## 2021-01-07 DIAGNOSIS — M25561 Pain in right knee: Secondary | ICD-10-CM

## 2021-01-07 DIAGNOSIS — M25562 Pain in left knee: Secondary | ICD-10-CM | POA: Diagnosis not present

## 2021-01-07 DIAGNOSIS — M791 Myalgia, unspecified site: Secondary | ICD-10-CM

## 2021-01-07 MED ORDER — PREDNISONE 10 MG PO TABS: 10.0000 mg | ORAL_TABLET | Freq: Every day | ORAL | 0 refills | Status: DC

## 2021-01-07 NOTE — Progress Notes (Signed)
BP 110/69   Pulse 100   Ht 5' (1.524 m)   Wt 229 lb (103.9 kg)   LMP 08/02/2017 (Exact Date)   BMI 44.72 kg/m    Subjective:    Patient ID: Nancy Stout, female    DOB: [birth year], 55 y.o.   MRN: [id number]  HPI: Nancy Stout is a 55 y.o. female  Chief Complaint  Patient presents with   Urticaria   RASH Duration:  months has been on and off for several months.  Location: generalized  Itching: yes Burning: yes Redness: yes Oozing: no Scaling: no Blisters: no Painful: yes Fevers: no Change in detergents/soaps/personal care products: no Recent illness: no Recent travel:no History of same: yes Context: worse Alleviating factors: hydrocortisone cream Treatments attempted:hydrocortisone cream, benadryl, and lotion/moisturizer Shortness of breath: no  Throat/tongue swelling: no Myalgias/arthralgias: yes- worse in the morning   Relevant past medical, surgical, family and social history reviewed and updated as indicated. Interim medical history since our last visit reviewed. Allergies and medications reviewed and updated.  Review of Systems  Musculoskeletal:  Positive for arthralgias and myalgias.  Skin:        hives   Per HPI unless specifically indicated above     Objective:    BP 110/69   Pulse 100   Ht 5' (1.524 m)   Wt 229 lb (103.9 kg)   LMP 08/02/2017 (Exact Date)   BMI 44.72 kg/m   Wt Readings from Last 3 Encounters:  01/07/21 229 lb (103.9 kg)  09/11/20 229 lb (103.9 kg)  08/22/20 224 lb 3.2 oz (101.7 kg)    Physical Exam Vitals and nursing note reviewed.  Constitutional:      General: She is not in acute distress.    Appearance: Normal appearance. She is normal weight. She is not ill-appearing, toxic-appearing or diaphoretic.  HENT:     Head: Normocephalic.     Right Ear: External ear normal.     Left Ear: External ear normal.     Nose: Nose normal.     Mouth/Throat:     Mouth: Mucous membranes are moist.     Pharynx:  Oropharynx is clear.  Eyes:     General:        Right eye: No discharge.        Left eye: No discharge.     Extraocular Movements: Extraocular movements intact.     Conjunctiva/sclera: Conjunctivae normal.     Pupils: Pupils are equal, round, and reactive to light.  Cardiovascular:     Rate and Rhythm: Normal rate and regular rhythm.     Heart sounds: No murmur heard. Pulmonary:     Effort: Pulmonary effort is normal. No respiratory distress.     Breath sounds: Normal breath sounds. No wheezing or rales.  Musculoskeletal:     Cervical back: Normal range of motion and neck supple.  Skin:    General: Skin is warm and dry.     Capillary Refill: Capillary refill takes less than 2 seconds.     Findings: Rash present.       Neurological:     General: No focal deficit present.     Mental Status: She is alert and oriented to person, place, and time. Mental status is at baseline.  Psychiatric:        Mood and Affect: Mood normal.        Behavior: Behavior normal.        Thought Content: Thought content normal.  Judgment: Judgment normal.    Results for orders placed or performed in visit on 08/22/20  CBC with Differential/Platelet  Result Value Ref Range   WBC 6.4 3.4 - 10.8 x10E3/uL   RBC 5.41 (H) 3.77 - 5.28 x10E6/uL   Hemoglobin 17.9 (H) 11.1 - 15.9 g/dL   Hematocrit 52.6 (H) 34.0 - 46.6 %   MCV 97 79 - 97 fL   MCH 33.1 (H) 26.6 - 33.0 pg   MCHC 34.0 31.5 - 35.7 g/dL   RDW 12.6 11.7 - 15.4 %   Platelets 270 150 - 450 x10E3/uL   Neutrophils 56 Not Estab. %   Lymphs 30 Not Estab. %   Monocytes 9 Not Estab. %   Eos 4 Not Estab. %   Basos 1 Not Estab. %   Neutrophils Absolute 3.6 1.4 - 7.0 x10E3/uL   Lymphocytes Absolute 1.9 0.7 - 3.1 x10E3/uL   Monocytes Absolute 0.6 0.1 - 0.9 x10E3/uL   EOS (ABSOLUTE) 0.3 0.0 - 0.4 x10E3/uL   Basophils Absolute 0.1 0.0 - 0.2 x10E3/uL   Immature Granulocytes 0 Not Estab. %   Immature Grans (Abs) 0.0 0.0 - 0.1 x10E3/uL   Comprehensive metabolic panel  Result Value Ref Range   Glucose 103 (H) 65 - 99 mg/dL   BUN 15 6 - 24 mg/dL   Creatinine, Ser 0.63 0.57 - 1.00 mg/dL   eGFR 105 >59 mL/min/1.73   BUN/Creatinine Ratio 24 (H) 9 - 23   Sodium 139 134 - 144 mmol/L   Potassium 5.2 3.5 - 5.2 mmol/L   Chloride 99 96 - 106 mmol/L   CO2 25 20 - 29 mmol/L   Calcium 10.0 8.7 - 10.2 mg/dL   Total Protein 6.9 6.0 - 8.5 g/dL   Albumin 4.3 3.8 - 4.9 g/dL   Globulin, Total 2.6 1.5 - 4.5 g/dL   Albumin/Globulin Ratio 1.7 1.2 - 2.2   Bilirubin Total 0.3 0.0 - 1.2 mg/dL   Alkaline Phosphatase 122 (H) 44 - 121 IU/L   AST 21 0 - 40 IU/L   ALT 22 0 - 32 IU/L  Microalbumin, Urine Waived  Result Value Ref Range   Microalb, Ur Waived 10 0 - 19 mg/L   Creatinine, Urine Waived 200 10 - 300 mg/dL   Microalb/Creat Ratio <30 <30 mg/g  Lipid Panel w/o Chol/HDL Ratio  Result Value Ref Range   Cholesterol, Total 247 (H) 100 - 199 mg/dL   Triglycerides 116 0 - 149 mg/dL   HDL 65 >39 mg/dL   VLDL Cholesterol Cal 20 5 - 40 mg/dL   LDL Chol Calc (NIH) 162 (H) 0 - 99 mg/dL  Specimen status report  Result Value Ref Range   specimen status report Comment       Assessment & Plan:   Problem List Items Addressed This Visit   None Visit Diagnoses     Hives    -  Primary   Complete course of prednisone. Will draw labs to rule out autoimmune. Referral placed for patient to see allergy due to ongoing hives.    Relevant Orders   C-reactive protein   Sed Rate (ESR)   Systemic Lupus Profile A   Antinuclear Antib (ANA)   CBC w/Diff   Comp Met (CMET)   Lyme disease, western blot   Rocky mtn spotted fvr abs pnl(IgG+IgM)   Ambulatory referral to Allergy   Myalgia       Relevant Orders   C-reactive protein   Sed Rate (ESR)  Systemic Lupus Profile A   Antinuclear Antib (ANA)   CBC w/Diff   Comp Met (CMET)   Lyme disease, western blot   Rocky mtn spotted fvr abs pnl(IgG+IgM)   Arthralgia of both knees       Relevant  Orders   C-reactive protein   Sed Rate (ESR)   Systemic Lupus Profile A   Antinuclear Antib (ANA)   CBC w/Diff   Comp Met (CMET)   Lyme disease, western blot   Rocky mtn spotted fvr abs pnl(IgG+IgM)        Follow up plan: Return if symptoms worsen or fail to improve.

## 2021-01-08 ENCOUNTER — Ambulatory Visit: Payer: Medicaid Other | Admitting: Family Medicine

## 2021-01-10 ENCOUNTER — Telehealth: Payer: Self-pay | Admitting: Family Medicine

## 2021-01-10 NOTE — Telephone Encounter (Signed)
Copied from CRM [id number]. Topic: General - Other >> Jan 10, 2021 11:28 AM Leafy Ro wrote: Reason for CRM: Pt received a call from sulibeya concerning blood work and has a couple of more questions and would like a return call

## 2021-01-10 NOTE — Telephone Encounter (Signed)
Patient wants to know if hives are being caused by lupus. Please advise.

## 2021-01-10 NOTE — Telephone Encounter (Signed)
Patient advised as below.  

## 2021-01-10 NOTE — Progress Notes (Signed)
Hi Nancy Stout.  Your lab work shows that your autoimmune marker is positive. I want you to see a rheumatologist for further evaluation.  I have placed the referral.  Please let me know if you have any questions.

## 2021-01-10 NOTE — Addendum Note (Signed)
Addended by: Larae Grooms on: 01/10/2021 09:55 AM   Modules accepted: Orders

## 2021-01-10 NOTE — Telephone Encounter (Signed)
The labs don't give a specific autoimmune disorder that she may.  However, it could be caused by an autoimmune condition like lupus.  Seeing the Rhematologist will give her more answers.

## 2021-01-15 LAB — COMPREHENSIVE METABOLIC PANEL
ALT: 23 IU/L (ref 0–32)
AST: 24 IU/L (ref 0–40)
Albumin/Globulin Ratio: 1.7 (ref 1.2–2.2)
Albumin: 4.5 g/dL (ref 3.8–4.9)
Alkaline Phosphatase: 112 IU/L (ref 44–121)
BUN/Creatinine Ratio: 24 — ABNORMAL HIGH (ref 9–23)
BUN: 18 mg/dL (ref 6–24)
Bilirubin Total: 0.5 mg/dL (ref 0.0–1.2)
CO2: 23 mmol/L (ref 20–29)
Calcium: 9.6 mg/dL (ref 8.7–10.2)
Chloride: 100 mmol/L (ref 96–106)
Creatinine, Ser: 0.74 mg/dL (ref 0.57–1.00)
Globulin, Total: 2.6 g/dL (ref 1.5–4.5)
Glucose: 94 mg/dL (ref 70–99)
Potassium: 4.6 mmol/L (ref 3.5–5.2)
Sodium: 139 mmol/L (ref 134–144)
Total Protein: 7.1 g/dL (ref 6.0–8.5)
eGFR: 95 mL/min/{1.73_m2} (ref >59)

## 2021-01-15 LAB — SYSTEMIC LUPUS PROFILE A
Chromatin Ab SerPl-aCnc: <0.2 AI (ref 0.0–0.9)
ENA RNP Ab: 0.2 AI (ref 0.0–0.9)
ENA SM Ab Ser-aCnc: <0.2 AI (ref 0.0–0.9)
ENA SSA (RO) Ab: <0.2 AI (ref 0.0–0.9)
ENA SSB (LA) Ab: <0.2 AI (ref 0.0–0.9)
Rheumatoid fact SerPl-aCnc: 11.3 IU/mL (ref <14.0)
dsDNA Ab: 11 IU/mL — ABNORMAL HIGH (ref 0–9)

## 2021-01-15 LAB — C-REACTIVE PROTEIN: CRP: 12 mg/L — ABNORMAL HIGH (ref 0–10)

## 2021-01-15 LAB — LYME DISEASE, WESTERN BLOT
IgG P18 Ab.: ABSENT
IgG P23 Ab.: ABSENT
IgG P30 Ab.: ABSENT
IgG P39 Ab.: ABSENT
IgG P41 Ab.: ABSENT
IgG P45 Ab.: ABSENT
IgG P66 Ab.: ABSENT
IgG P93 Ab.: ABSENT
IgM P23 Ab.: ABSENT
IgM P39 Ab.: ABSENT
IgM P41 Ab.: ABSENT
Lyme IgG Wb: NEGATIVE
Lyme IgM Wb: NEGATIVE

## 2021-01-15 LAB — CBC WITH DIFFERENTIAL/PLATELET
Basophils Absolute: 0 10*3/uL (ref 0.0–0.2)
Basos: 0 %
EOS (ABSOLUTE): 0.3 10*3/uL (ref 0.0–0.4)
Eos: 4 %
Hematocrit: 49.4 % — ABNORMAL HIGH (ref 34.0–46.6)
Hemoglobin: 17.1 g/dL — ABNORMAL HIGH (ref 11.1–15.9)
Immature Grans (Abs): 0 10*3/uL (ref 0.0–0.1)
Immature Granulocytes: 0 %
Lymphocytes Absolute: 2 10*3/uL (ref 0.7–3.1)
Lymphs: 28 %
MCH: 32.4 pg (ref 26.6–33.0)
MCHC: 34.6 g/dL (ref 31.5–35.7)
MCV: 94 fL (ref 79–97)
Monocytes Absolute: 0.7 10*3/uL (ref 0.1–0.9)
Monocytes: 9 %
Neutrophils Absolute: 4.2 10*3/uL (ref 1.4–7.0)
Neutrophils: 59 %
Platelets: 271 10*3/uL (ref 150–450)
RBC: 5.27 x10E6/uL (ref 3.77–5.28)
RDW: 12 % (ref 11.7–15.4)
WBC: 7.2 10*3/uL (ref 3.4–10.8)

## 2021-01-15 LAB — ROCKY MTN SPOTTED FVR ABS PNL(IGG+IGM)
RMSF IgG: NEGATIVE
RMSF IgM: 0.5 index (ref 0.00–0.89)

## 2021-01-15 LAB — SEDIMENTATION RATE: Sed Rate: 8 mm/hr (ref 0–40)

## 2021-01-15 LAB — ANA: Anti Nuclear Antibody (ANA): POSITIVE — AB

## 2021-01-16 NOTE — Progress Notes (Signed)
Please let patient know that her Lyme test was negative.

## 2021-01-21 ENCOUNTER — Telehealth: Payer: Self-pay | Admitting: *Deleted

## 2021-01-21 ENCOUNTER — Other Ambulatory Visit: Payer: Self-pay | Admitting: Family Medicine

## 2021-01-21 NOTE — Telephone Encounter (Signed)
Requested medication (s) are due for refill today - no  Requested medication (s) are on the active medication list -yes  Future visit scheduled -yes  Last refill: 01/04/21 8.5 each  Notes to clinic: Request RF: Call to patient- patient states she left her inhaler in [place] over the weekend and instead of driving back to get it - she is requesting RF. Advised would send message to provider for review.  Requested Prescriptions  Pending Prescriptions Disp Refills   albuterol (VENTOLIN HFA) 108 (90 Base) MCG/ACT inhaler [Pharmacy Med Name: ALBUTEROL HFA (PROAIR) INHALER] 8.5 each 0    Sig: INHALE 2 PUFFS BY MOUTH EVERY 4 HOURS AS NEEDED     Pulmonology:  Beta Agonists Failed - 01/21/2021  9:31 AM      Failed - One inhaler should last at least one month. If the patient is requesting refills earlier, contact the patient to check for uncontrolled symptoms.      Passed - Valid encounter within last 12 months    Recent Outpatient Visits           2 weeks ago Hives   [facility] [place], Clydie Braun, NP   4 months ago Rash due to allergy   [facility] Vigg, Avanti, MD   5 months ago Simple chronic bronchitis [facility])   [facility] [place], Megan P, DO   1 year ago Simple chronic bronchitis [facility])   [facility] [place], Megan P, DO   2 years ago Moderate persistent asthma with acute exacerbation   [facility] Dorcas Carrow, DO       Future Appointments             In 1 month Johnson, Megan P, DO [facility], [facility]               Requested Prescriptions  Pending Prescriptions Disp Refills   albuterol (VENTOLIN HFA) 108 (90 Base) MCG/ACT inhaler [Pharmacy Med Name: ALBUTEROL HFA (PROAIR) INHALER] 8.5 each 0    Sig: INHALE 2 PUFFS BY MOUTH EVERY 4 HOURS AS NEEDED     Pulmonology:  Beta Agonists Failed - 01/21/2021  9:31 AM      Failed - One inhaler should last at least one month. If the patient is  requesting refills earlier, contact the patient to check for uncontrolled symptoms.      Passed - Valid encounter within last 12 months    Recent Outpatient Visits           2 weeks ago Hives   [facility] Larae Grooms, NP   4 months ago Rash due to allergy   [facility] Vigg, Avanti, MD   5 months ago Simple chronic bronchitis [facility])   [facility] [place], Megan P, DO   1 year ago Simple chronic bronchitis [facility])   [facility] [place], Megan P, DO   2 years ago Moderate persistent asthma with acute exacerbation   [facility] Dorcas Carrow, DO       Future Appointments             In 1 month Johnson, Oralia Rud, DO [facility], [facility]

## 2021-01-21 NOTE — Telephone Encounter (Signed)
Routing to provider. Can Chantix be sent in for the patient?

## 2021-01-21 NOTE — Telephone Encounter (Signed)
Call to patient regarding a refill request- patient is requesting Rx for Chantix. Patient advised would send her request to provider- will need new Rx.

## 2021-01-22 NOTE — Telephone Encounter (Signed)
Needs appt

## 2021-01-23 NOTE — Telephone Encounter (Signed)
Pt scheduled for Tuesday 10/25

## 2021-01-29 ENCOUNTER — Ambulatory Visit: Payer: Medicaid Other | Admitting: Family Medicine

## 2021-02-07 ENCOUNTER — Telehealth: Payer: Self-pay | Admitting: Family Medicine

## 2021-02-07 ENCOUNTER — Ambulatory Visit: Payer: Self-pay | Admitting: *Deleted

## 2021-02-07 NOTE — Telephone Encounter (Signed)
Summary: hives   The patient has been experiencing hives alternating daily for months   The patient shares that the hives are appearing from the neck down   The patient shares that the hives are raised, red and itching   Please contact further      Reason for Disposition  [1] Hives has occurred 3 or more times in the last year AND [2] the cause was not found  Answer Assessment - Initial Assessment Questions 1. APPEARANCE: "What does the rash look like?"      Red bumps-swelled 2. LOCATION: "Where is the rash located?"      Under neck, right thigh,side, under arms, R abdomen 3. NUMBER: "How many hives are there?"      4 red areas 4. SIZE: "How big are the hives?" (inches, cm, compare to coins) "Do they all look the same or is there lots of variation in shape and size?"      Large area- larger than hand 5. ONSET: "When did the hives begin?" (Hours or days ago)      Comes and goes- mostly in morning 6. ITCHING: "Does it itch?" If Yes, ask: "How bad is the itch?"    - MILD: doesn't interfere with normal activities   - MODERATE-SEVERE: interferes with work, school, sleep, or other activities      Moderate/severe- itches when not there 7. RECURRENT PROBLEM: "Have you had hives before?" If Yes, ask: "When was the last time?" and "What happened that time?"      Unknown cause- steriod helped but when stopped hives came back 8. TRIGGERS: "Were you exposed to any new food, plant, cosmetic product or animal just before the hives began?"     unsure 9. OTHER SYMPTOMS: "Do you have any other symptoms?" (e.g., fever, tongue swelling, difficulty breathing, abdominal pain)     no 10. PREGNANCY: "Is there any chance you are pregnant?" "When was your last menstrual period?"       na  Protocols used: Hives-A-AH

## 2021-02-07 NOTE — Telephone Encounter (Signed)
See notes below for hives concern.  Patient has cancelled her last 2 follow up appointments (10/04 & 10/25) as well.  Please advise as needed.

## 2021-02-07 NOTE — Telephone Encounter (Signed)
Can discuss at appointment. Please schedule

## 2021-02-07 NOTE — Telephone Encounter (Signed)
Copied from CRM [id number]. Topic: Referral - Request for Referral >> Feb 07, 2021  9:28 AM Gaetana Michaelis A wrote: Has patient seen PCP for this complaint? No. *If NO, is insurance requiring patient see PCP for this issue before PCP can refer them? Referral for which specialty: Rheumatology  Preferred provider/office: Patient needs to be seen by a Prov that accepts [employer]  Reason for referral: Immune concerns

## 2021-02-07 NOTE — Telephone Encounter (Signed)
Patient states her hives are back- they come and go- itching is worst. Steriod treatment helps- but the hives come back after she finishes the medication. Patient states she need referral to different Rheumatologist- they do not take her insurance.

## 2021-02-07 NOTE — Telephone Encounter (Signed)
Routing to referral coordinator. Can we resend this referral per patient request please?

## 2021-02-08 ENCOUNTER — Other Ambulatory Visit: Payer: Self-pay

## 2021-02-08 ENCOUNTER — Ambulatory Visit (INDEPENDENT_AMBULATORY_CARE_PROVIDER_SITE_OTHER): Payer: Medicaid Other | Admitting: Nurse Practitioner

## 2021-02-08 ENCOUNTER — Ambulatory Visit: Payer: Self-pay | Admitting: *Deleted

## 2021-02-08 ENCOUNTER — Other Ambulatory Visit: Payer: Self-pay | Admitting: Family Medicine

## 2021-02-08 ENCOUNTER — Encounter: Payer: Self-pay | Admitting: Nurse Practitioner

## 2021-02-08 VITALS — BP 137/76 | HR 86 | Temp 98.2°F | Wt 229.2 lb

## 2021-02-08 DIAGNOSIS — L509 Urticaria, unspecified: Secondary | ICD-10-CM | POA: Insufficient documentation

## 2021-02-08 DIAGNOSIS — L508 Other urticaria: Secondary | ICD-10-CM | POA: Insufficient documentation

## 2021-02-08 MED ORDER — PREDNISONE 10 MG PO TABS: 10.0000 mg | ORAL_TABLET | Freq: Every day | ORAL | 0 refills | Status: DC

## 2021-02-08 NOTE — Telephone Encounter (Signed)
Requested medication (s) are due for refill today - no  Requested medication (s) are on the active medication list -yes  Future visit scheduled -yes  Last refill: 01/21/21 8.5  Notes to clinic: Request RF: patient states she is doing well- still has half of haler left- she wants to make sure provider is aware Proair is not available and she wants Rx on file when needed. Advised will send PCP message requesting future fills  Requested Prescriptions  Pending Prescriptions Disp Refills   VENTOLIN HFA 108 (90 Base) MCG/ACT inhaler [Pharmacy Med Name: VENTOLIN HFA 90 MCG INHALER] 18 each     Sig: TAKE 2 PUFFS BY MOUTH EVERY 4 HOURS AS NEEDED     Pulmonology:  Beta Agonists Failed - 02/08/2021 11:25 AM      Failed - One inhaler should last at least one month. If the patient is requesting refills earlier, contact the patient to check for uncontrolled symptoms.      Passed - Valid encounter within last 12 months    Recent Outpatient Visits           Today Hives   [facility] Larae Grooms, NP   1 month ago Hives   [facility] [place], Clydie Braun, NP   5 months ago Rash due to allergy   [facility] Vigg, Avanti, MD   5 months ago Simple chronic bronchitis [facility])   [facility] [place], Megan P, DO   1 year ago Simple chronic bronchitis (HCC)   [facility] [place], Oralia Rud, DO       Future Appointments             In 2 weeks Laural Benes, Oralia Rud, DO [facility], [facility]               Requested Prescriptions  Pending Prescriptions Disp Refills   VENTOLIN HFA 108 (90 Base) MCG/ACT inhaler [Pharmacy Med Name: VENTOLIN HFA 90 MCG INHALER] 18 each     Sig: TAKE 2 PUFFS BY MOUTH EVERY 4 HOURS AS NEEDED     Pulmonology:  Beta Agonists Failed - 02/08/2021 11:25 AM      Failed - One inhaler should last at least one month. If the patient is requesting refills earlier, contact the patient to check for uncontrolled  symptoms.      Passed - Valid encounter within last 12 months    Recent Outpatient Visits           Today Hives   [facility] Larae Grooms, NP   1 month ago Hives   [facility] [place], Clydie Braun, NP   5 months ago Rash due to allergy   [facility] Vigg, Avanti, MD   5 months ago Simple chronic bronchitis [facility])   [facility] [place], Megan P, DO   1 year ago Simple chronic bronchitis [facility])   [facility] [place], Oralia Rud, DO       Future Appointments             In 2 weeks Laural Benes, Oralia Rud, DO [facility], [facility]

## 2021-02-08 NOTE — Telephone Encounter (Signed)
Did not need to speak with pt.   She was triaged yesterday and has an appt this morning at 10:20 for the hives. I answered a question for the agent.  I forwarded this comment to [facility] as an FY.

## 2021-02-08 NOTE — Progress Notes (Signed)
BP 137/76   Pulse 86   Temp 98.2 F (36.8 C) (Oral)   Wt 229 lb 3.2 oz (104 kg)   LMP 08/02/2017 (Exact Date)   SpO2 97%   BMI 44.76 kg/m    Subjective:    Patient ID: Nancy Stout, female    DOB: 1965-06-02, 55 y.o.   MRN: [id number]  HPI: Nancy Stout is a 55 y.o. female  Chief Complaint  Patient presents with   Urticaria    Pt states she is still breaking out in hives and itching all over   RASH Duration:  months has been on and off for several months. Last episode was at the beginning of October.  Location: generalized  Itching: yes Burning: yes Redness: yes Oozing: no Scaling: no Blisters: no Painful: yes Fevers: no Change in detergents/soaps/personal care products: no Recent illness: no Recent travel:no History of same: yes Context: worse Alleviating factors: hydrocortisone cream Treatments attempted:hydrocortisone cream, benadryl, and lotion/moisturizer Shortness of breath: no  Throat/tongue swelling: no Myalgias/arthralgias: yes- worse in the morning   Relevant past medical, surgical, family and social history reviewed and updated as indicated. Interim medical history since our last visit reviewed. Allergies and medications reviewed and updated.  Review of Systems  Musculoskeletal:  Positive for arthralgias and myalgias.  Skin:        hives   Per HPI unless specifically indicated above     Objective:    BP 137/76   Pulse 86   Temp 98.2 F (36.8 C) (Oral)   Wt 229 lb 3.2 oz (104 kg)   LMP 08/02/2017 (Exact Date)   SpO2 97%   BMI 44.76 kg/m   Wt Readings from Last 3 Encounters:  02/08/21 229 lb 3.2 oz (104 kg)  01/07/21 229 lb (103.9 kg)  09/11/20 229 lb (103.9 kg)    Physical Exam Vitals and nursing note reviewed.  Constitutional:      General: She is not in acute distress.    Appearance: Normal appearance. She is normal weight. She is not ill-appearing, toxic-appearing or diaphoretic.  HENT:     Head: Normocephalic.      Right Ear: External ear normal.     Left Ear: External ear normal.     Nose: Nose normal.     Mouth/Throat:     Mouth: Mucous membranes are moist.     Pharynx: Oropharynx is clear.  Eyes:     General:        Right eye: No discharge.        Left eye: No discharge.     Extraocular Movements: Extraocular movements intact.     Conjunctiva/sclera: Conjunctivae normal.     Pupils: Pupils are equal, round, and reactive to light.  Cardiovascular:     Rate and Rhythm: Normal rate and regular rhythm.     Heart sounds: No murmur heard. Pulmonary:     Effort: Pulmonary effort is normal. No respiratory distress.     Breath sounds: Normal breath sounds. No wheezing or rales.  Musculoskeletal:     Cervical back: Normal range of motion and neck supple.  Skin:    General: Skin is warm and dry.     Capillary Refill: Capillary refill takes less than 2 seconds.     Findings: Rash present.       Neurological:     General: No focal deficit present.     Mental Status: She is alert and oriented to person, place, and time. Mental status is at baseline.  Psychiatric:        Mood and Affect: Mood normal.        Behavior: Behavior normal.        Thought Content: Thought content normal.        Judgment: Judgment normal.    Results for orders placed or performed in visit on 01/07/21  C-reactive protein  Result Value Ref Range   CRP 12 (H) 0 - 10 mg/L  Sed Rate (ESR)  Result Value Ref Range   Sed Rate 8 0 - 40 mm/hr  Systemic Lupus Profile A  Result Value Ref Range   ENA RNP Ab 0.2 0.0 - 0.9 AI   ENA SM Ab Ser-aCnc <0.2 0.0 - 0.9 AI   Rhuematoid fact SerPl-aCnc 11.3 <14.0 IU/mL   Chromatin Ab SerPl-aCnc <0.2 0.0 - 0.9 AI   ENA SSA (RO) Ab <0.2 0.0 - 0.9 AI   ENA SSB (LA) Ab <0.2 0.0 - 0.9 AI   dsDNA Ab 11 (H) 0 - 9 IU/mL  Antinuclear Antib (ANA)  Result Value Ref Range   Anti Nuclear Antibody (ANA) Positive (A) Negative  CBC w/Diff  Result Value Ref Range   WBC 7.2 3.4 - 10.8 x10E3/uL    RBC 5.27 3.77 - 5.28 x10E6/uL   Hemoglobin 17.1 (H) 11.1 - 15.9 g/dL   Hematocrit 49.4 (H) 34.0 - 46.6 %   MCV 94 79 - 97 fL   MCH 32.4 26.6 - 33.0 pg   MCHC 34.6 31.5 - 35.7 g/dL   RDW 12.0 11.7 - 15.4 %   Platelets 271 150 - 450 x10E3/uL   Neutrophils 59 Not Estab. %   Lymphs 28 Not Estab. %   Monocytes 9 Not Estab. %   Eos 4 Not Estab. %   Basos 0 Not Estab. %   Neutrophils Absolute 4.2 1.4 - 7.0 x10E3/uL   Lymphocytes Absolute 2.0 0.7 - 3.1 x10E3/uL   Monocytes Absolute 0.7 0.1 - 0.9 x10E3/uL   EOS (ABSOLUTE) 0.3 0.0 - 0.4 x10E3/uL   Basophils Absolute 0.0 0.0 - 0.2 x10E3/uL   Immature Granulocytes 0 Not Estab. %   Immature Grans (Abs) 0.0 0.0 - 0.1 x10E3/uL  Comp Met (CMET)  Result Value Ref Range   Glucose 94 70 - 99 mg/dL   BUN 18 6 - 24 mg/dL   Creatinine, Ser 0.74 0.57 - 1.00 mg/dL   eGFR 95 >59 mL/min/1.73   BUN/Creatinine Ratio 24 (H) 9 - 23   Sodium 139 134 - 144 mmol/L   Potassium 4.6 3.5 - 5.2 mmol/L   Chloride 100 96 - 106 mmol/L   CO2 23 20 - 29 mmol/L   Calcium 9.6 8.7 - 10.2 mg/dL   Total Protein 7.1 6.0 - 8.5 g/dL   Albumin 4.5 3.8 - 4.9 g/dL   Globulin, Total 2.6 1.5 - 4.5 g/dL   Albumin/Globulin Ratio 1.7 1.2 - 2.2   Bilirubin Total 0.5 0.0 - 1.2 mg/dL   Alkaline Phosphatase 112 44 - 121 IU/L   AST 24 0 - 40 IU/L   ALT 23 0 - 32 IU/L  Lyme disease, western blot  Result Value Ref Range     IgG P93 Ab. Absent      IgG P66 Ab. Absent      IgG P58 Ab. Present (A)      IgG P45 Ab. Absent      IgG P41 Ab. Absent      IgG P39 Ab. Absent      IgG  P30 Ab. Absent      IgG P28 Ab. Present (A)      IgG P23 Ab. Absent      IgG P18 Ab. Absent    Lyme IgG Wb Negative      IgM P41 Ab. Absent      IgM P39 Ab. Absent      IgM P23 Ab. Absent    Lyme IgM Wb Negative   Rocky mtn spotted fvr abs pnl(IgG+IgM)  Result Value Ref Range   RMSF IgG Negative Negative   RMSF IgM 0.50 0.00 - 0.89 index      Assessment & Plan:   Problem List Items Addressed This  Visit       Musculoskeletal and Integument   Hives - Primary    Chronic.  Last episode was one month ago.  Will repeat steroid course that improved symptoms one month ago.  Discussed lab work from last visit and positive ANA.  Patient has not been able to get into Rheumatology yet.  Will reach out to referral coordinator to see if we can assist patient in getting in sooner.          Follow up plan: Return if symptoms worsen or fail to improve.  A total of 30 minutes were spent on this encounter today.  When total time is documented, this includes both the face-to-face and non-face-to-face time personally spent before, during and after the visit on the date of the encounter discussing lab results from last visit, the possibility of autoimmune disorder causing patient's symptoms, and the importance in getting into see Rheumatology.

## 2021-02-08 NOTE — Assessment & Plan Note (Signed)
Chronic.  Last episode was one month ago.  Will repeat steroid course that improved symptoms one month ago.  Discussed lab work from last visit and positive ANA.  Patient has not been able to get into Rheumatology yet.  Will reach out to referral coordinator to see if we can assist patient in getting in sooner.

## 2021-02-18 ENCOUNTER — Other Ambulatory Visit: Payer: Self-pay | Admitting: Family Medicine

## 2021-02-22 ENCOUNTER — Other Ambulatory Visit: Payer: Self-pay | Admitting: Family Medicine

## 2021-02-22 NOTE — Telephone Encounter (Signed)
Requested Prescriptions  Pending Prescriptions Disp Refills  . lisinopril (ZESTRIL) 20 MG tablet [Pharmacy Med Name: LISINOPRIL 20 MG TABLET] 90 tablet 0    Sig: TAKE 1 TABLET BY MOUTH EVERY DAY     Cardiovascular:  ACE Inhibitors Passed - 02/22/2021  1:38 AM      Passed - Cr in normal range and within 180 days    Creatinine, Ser  Date Value Ref Range Status  01/07/2021 0.74 0.57 - 1.00 mg/dL Final         Passed - K in normal range and within 180 days    Potassium  Date Value Ref Range Status  01/07/2021 4.6 3.5 - 5.2 mmol/L Final         Passed - Patient is not pregnant      Passed - Last BP in normal range    BP Readings from Last 1 Encounters:  02/08/21 137/76         Passed - Valid encounter within last 6 months    Recent Outpatient Visits          2 weeks ago Hives   [facility] Larae Grooms, NP   1 month ago Hives   [facility] [place], Clydie Braun, NP   5 months ago Rash due to allergy   [facility] Vigg, Avanti, MD   6 months ago Simple chronic bronchitis [facility])   [facility] [place], Megan P, DO   1 year ago Simple chronic bronchitis [facility])   [facility] [place], [place], DO      Future Appointments            In 3 days Laural Benes, Oralia Rud, DO [facility]           . pravastatin (PRAVACHOL) 80 MG tablet [Pharmacy Med Name: PRAVASTATIN SODIUM 80 MG TAB] 90 tablet 1    Sig: TAKE 1 TABLET BY MOUTH EVERY DAY     Cardiovascular:  Antilipid - Statins Failed - 02/22/2021  1:38 AM      Failed - Total Cholesterol in normal range and within 360 days    Cholesterol, Total  Date Value Ref Range Status  08/22/2020 247 (H) 100 - 199 mg/dL Final   Cholesterol Piccolo, Waived  Date Value Ref Range Status  01/19/2015 221 (H) <200 mg/dL Final    Comment:                            Desirable                <200                         Borderline High      200- 239                          High                     >239          Failed - LDL in normal range and within 360 days    LDL Chol Calc (NIH)  Date Value Ref Range Status  08/22/2020 162 (H) 0 - 99 mg/dL Final         Passed - HDL in normal range and within 360 days    HDL  Date Value Ref Range Status  08/22/2020 65 >39 mg/dL Final         Passed - Triglycerides in normal range and within 360 days    Triglycerides  Date Value Ref Range Status  08/22/2020 116 0 - 149 mg/dL Final   Triglycerides Piccolo,Waived  Date Value Ref Range Status  01/19/2015 91 <150 mg/dL Final    Comment:                            Normal                   <150                         Borderline High     150 - 199                         High                200 - 499                         Very High                >499          Passed - Patient is not pregnant      Passed - Valid encounter within last 12 months    Recent Outpatient Visits          2 weeks ago Hives   [facility] Larae Grooms, NP   1 month ago Hives   [facility] [place], Clydie Braun, NP   5 months ago Rash due to allergy   [facility] Vigg, Avanti, MD   6 months ago Simple chronic bronchitis [facility])   [facility] [place], Megan P, DO   1 year ago Simple chronic bronchitis [facility])   [facility] [place], Oralia Rud, DO      Future Appointments            In 3 days Laural Benes, Oralia Rud, DO [facility], [facility]

## 2021-02-24 IMAGING — CR DG CHEST 2V
1 series · 2 of 2 positions shown · non-contrast
Comparison: None.

CLINICAL DATA: Pt reports productive cough x 30 yrs. Current
smoker. Hx of HTN, COPD and asthma.

EXAM:
CHEST - 2 VIEW

[Series 1: dg chest 2 view · 0.14mm/px · 2 of 2 slices shown]
[im 1/2]
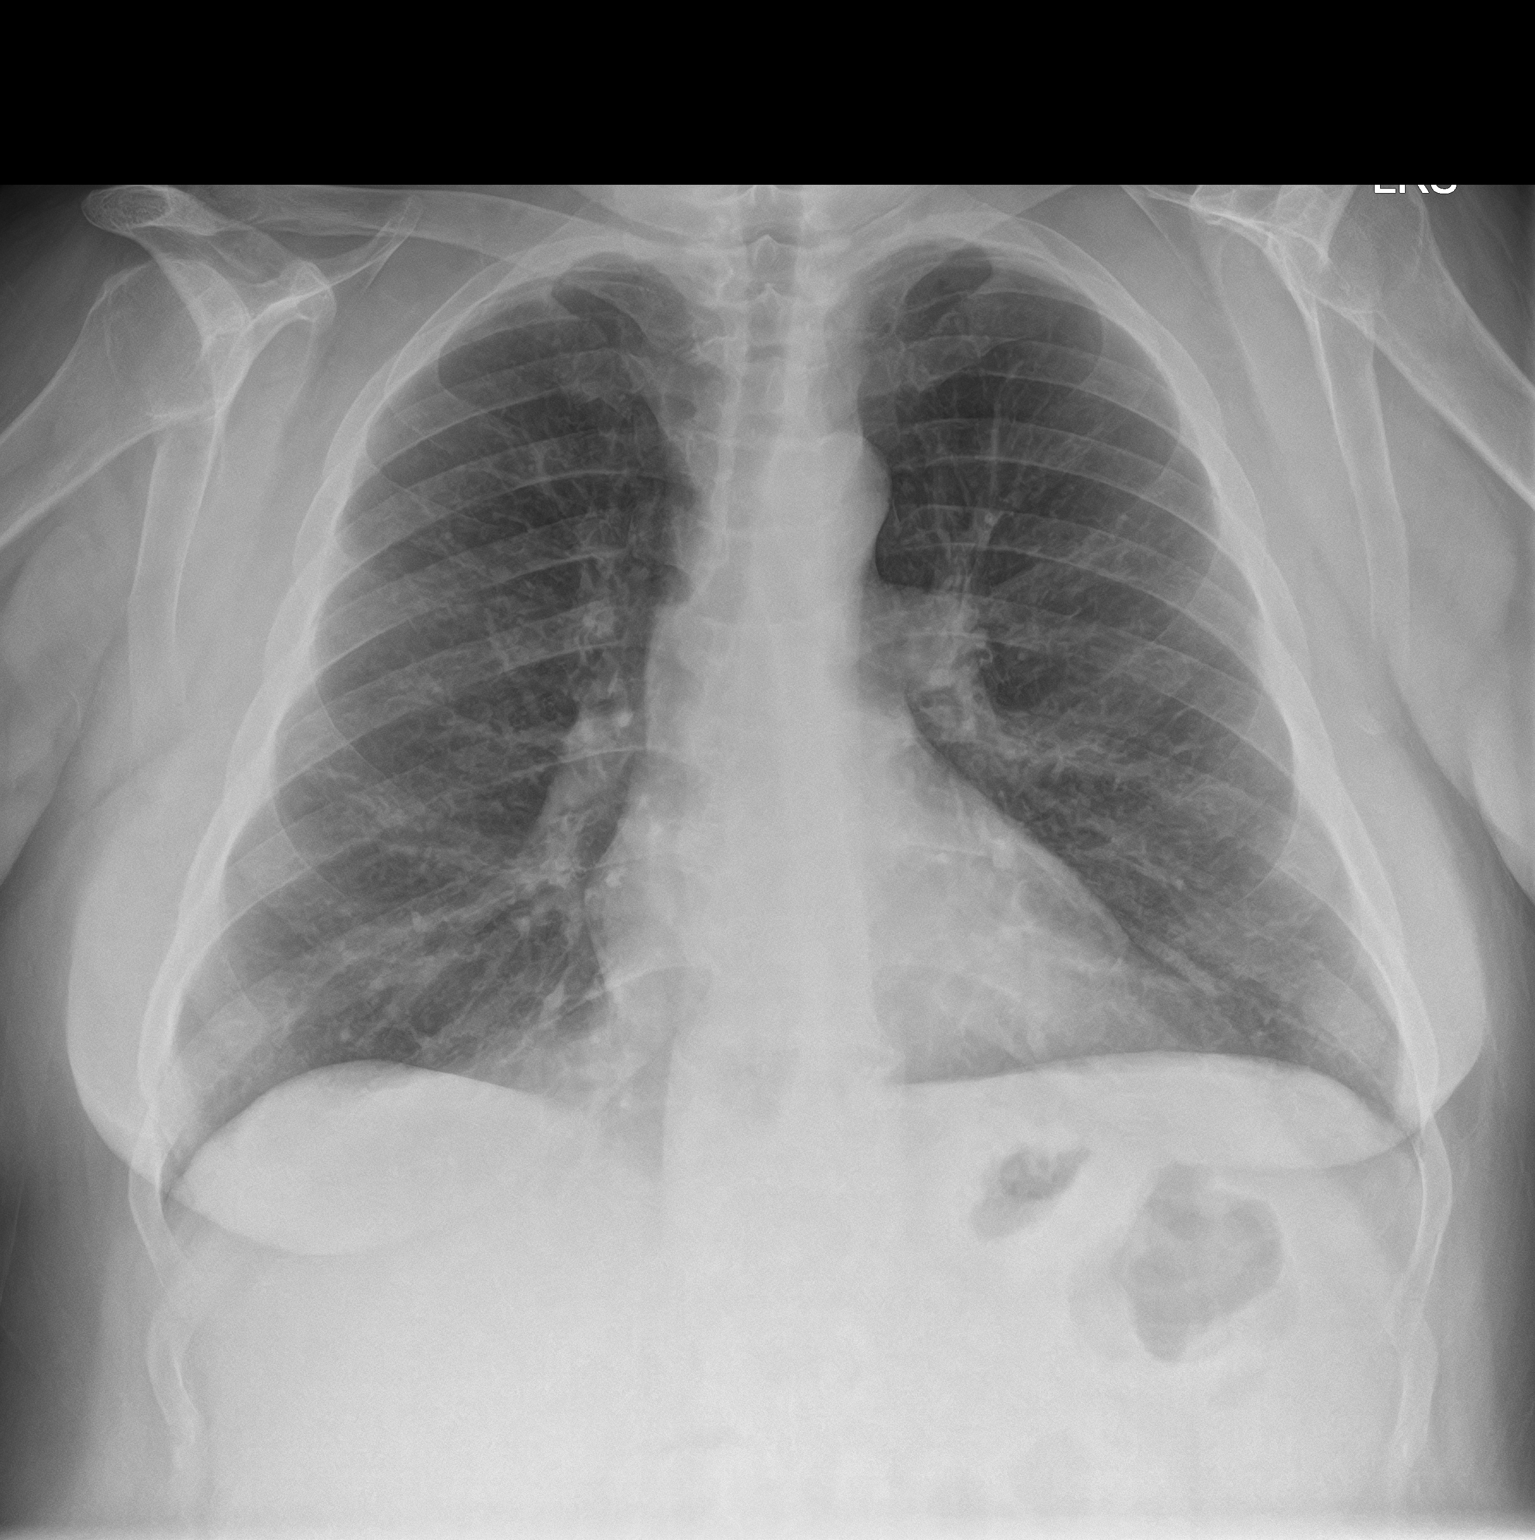
[im 2/2]
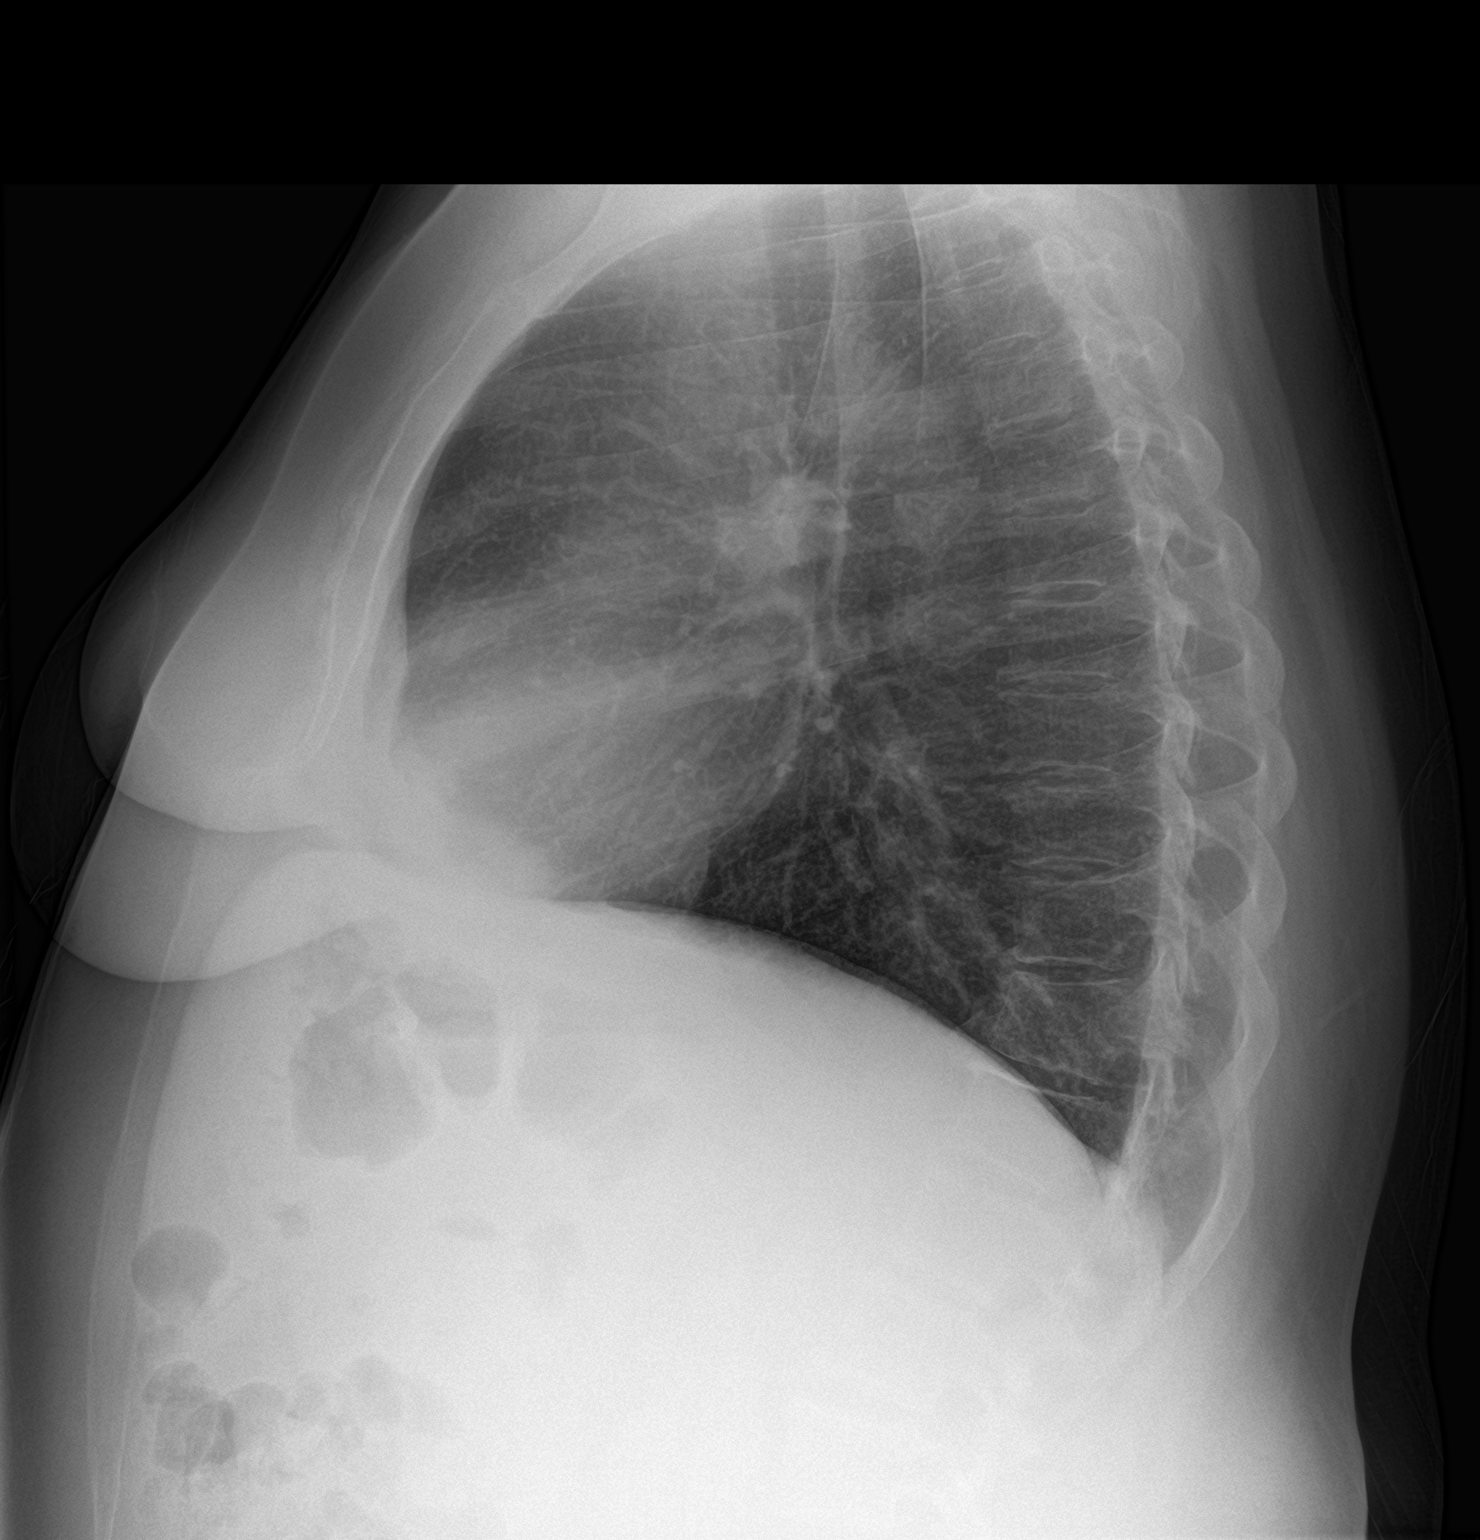

[2 of 2 positions shown; findings below may reference images not displayed]

FINDINGS: The heart size and mediastinal contours are within normal limits.
Both lungs are clear. There is mild anterior wedge morphology of T11
and T12.
IMPRESSION: No active cardiopulmonary disease.  The

## 2021-02-25 ENCOUNTER — Ambulatory Visit (INDEPENDENT_AMBULATORY_CARE_PROVIDER_SITE_OTHER): Payer: Medicaid Other | Admitting: Family Medicine

## 2021-02-25 ENCOUNTER — Encounter: Payer: Self-pay | Admitting: Family Medicine

## 2021-02-25 ENCOUNTER — Other Ambulatory Visit: Payer: Self-pay

## 2021-02-25 VITALS — BP 106/69 | HR 97 | Temp 98.5°F | Ht 60.0 in | Wt 230.8 lb

## 2021-02-25 DIAGNOSIS — Z1211 Encounter for screening for malignant neoplasm of colon: Secondary | ICD-10-CM

## 2021-02-25 DIAGNOSIS — J4540 Moderate persistent asthma, uncomplicated: Secondary | ICD-10-CM | POA: Diagnosis not present

## 2021-02-25 DIAGNOSIS — J410 Simple chronic bronchitis: Secondary | ICD-10-CM

## 2021-02-25 DIAGNOSIS — D751 Secondary polycythemia: Secondary | ICD-10-CM

## 2021-02-25 DIAGNOSIS — E782 Mixed hyperlipidemia: Secondary | ICD-10-CM | POA: Diagnosis not present

## 2021-02-25 DIAGNOSIS — L508 Other urticaria: Secondary | ICD-10-CM | POA: Diagnosis not present

## 2021-02-25 DIAGNOSIS — K529 Noninfective gastroenteritis and colitis, unspecified: Secondary | ICD-10-CM | POA: Diagnosis not present

## 2021-02-25 DIAGNOSIS — Z1231 Encounter for screening mammogram for malignant neoplasm of breast: Secondary | ICD-10-CM

## 2021-02-25 DIAGNOSIS — I129 Hypertensive chronic kidney disease with stage 1 through stage 4 chronic kidney disease, or unspecified chronic kidney disease: Secondary | ICD-10-CM | POA: Diagnosis not present

## 2021-02-25 DIAGNOSIS — L509 Urticaria, unspecified: Secondary | ICD-10-CM

## 2021-02-25 DIAGNOSIS — Z720 Tobacco use: Secondary | ICD-10-CM | POA: Diagnosis not present

## 2021-02-25 LAB — URINALYSIS, ROUTINE W REFLEX MICROSCOPIC
Bilirubin, UA: NEGATIVE
Glucose, UA: NEGATIVE
Leukocytes,UA: NEGATIVE
Nitrite, UA: NEGATIVE
RBC, UA: NEGATIVE
Specific Gravity, UA: 1.025 (ref 1.005–1.030)
Urobilinogen, Ur: 1 mg/dL (ref 0.2–1.0)
pH, UA: 5.5 (ref 5.0–7.5)

## 2021-02-25 LAB — MICROSCOPIC EXAMINATION
Bacteria, UA: NONE SEEN
RBC, Urine: NONE SEEN /hpf (ref 0–2)
WBC, UA: NONE SEEN /hpf (ref 0–5)

## 2021-02-25 LAB — MICROALBUMIN, URINE WAIVED
Creatinine, Urine Waived: 300 mg/dL (ref 10–300)
Microalb, Ur Waived: 80 mg/L — ABNORMAL HIGH (ref 0–19)

## 2021-02-25 MED ORDER — ALBUTEROL SULFATE (2.5 MG/3ML) 0.083% IN NEBU: 2.5000 mg | INHALATION_SOLUTION | RESPIRATORY_TRACT | 6 refills | Status: DC | PRN

## 2021-02-25 MED ORDER — LISINOPRIL 20 MG PO TABS: 20.0000 mg | ORAL_TABLET | Freq: Every day | ORAL | 1 refills | Status: DC

## 2021-02-25 MED ORDER — MONTELUKAST SODIUM 10 MG PO TABS: 10.0000 mg | ORAL_TABLET | Freq: Every day | ORAL | 1 refills | Status: DC

## 2021-02-25 MED ORDER — OMEPRAZOLE 20 MG PO CPDR
DELAYED_RELEASE_CAPSULE | ORAL | 1 refills | Status: DC
Start: 2021-02-25 — End: 2021-08-19

## 2021-02-25 MED ORDER — ALBUTEROL SULFATE HFA 108 (90 BASE) MCG/ACT IN AERS: 2.0000 | INHALATION_SPRAY | Freq: Four times a day (QID) | RESPIRATORY_TRACT | 6 refills | Status: DC | PRN

## 2021-02-25 MED ORDER — FAMOTIDINE 20 MG PO TABS: 20.0000 mg | ORAL_TABLET | Freq: Two times a day (BID) | ORAL | 1 refills | Status: DC

## 2021-02-25 MED ORDER — GABAPENTIN 400 MG PO CAPS: 400.0000 mg | ORAL_CAPSULE | Freq: Three times a day (TID) | ORAL | 1 refills | Status: DC

## 2021-02-25 MED ORDER — FLUTICASONE PROPIONATE 50 MCG/ACT NA SUSP: NASAL | 6 refills | Status: DC

## 2021-02-25 MED ORDER — CETIRIZINE HCL 10 MG PO TABS: 10.0000 mg | ORAL_TABLET | Freq: Every day | ORAL | 4 refills | Status: DC

## 2021-02-25 MED ORDER — NICOTINE 21 MG/24HR TD PT24: 21.0000 mg | MEDICATED_PATCH | Freq: Every day | TRANSDERMAL | 1 refills | Status: DC

## 2021-02-25 MED ORDER — NAPROXEN 500 MG PO TABS: 500.0000 mg | ORAL_TABLET | Freq: Two times a day (BID) | ORAL | 1 refills | Status: DC

## 2021-02-25 MED ORDER — PRAVASTATIN SODIUM 80 MG PO TABS: 80.0000 mg | ORAL_TABLET | Freq: Every day | ORAL | 1 refills | Status: DC

## 2021-02-25 MED ORDER — BUDESONIDE-FORMOTEROL FUMARATE 160-4.5 MCG/ACT IN AERO: 2.0000 | INHALATION_SPRAY | Freq: Two times a day (BID) | RESPIRATORY_TRACT | 12 refills | Status: DC

## 2021-02-25 MED ORDER — BUPROPION HCL ER (SR) 150 MG PO TB12: ORAL_TABLET | ORAL | 2 refills | Status: DC

## 2021-02-25 NOTE — Assessment & Plan Note (Signed)
Under good control on current regimen. Continue current regimen. Continue to monitor. Call with any concerns. Refills given. Labs drawn today.   

## 2021-02-25 NOTE — Progress Notes (Signed)
BP 106/69   Pulse 97   Temp 98.5 F (36.9 C)   Ht 5' (1.524 m)   Wt 230 lb 12.8 oz (104.7 kg)   LMP 08/02/2017 (Exact Date)   SpO2 96%   BMI 45.08 kg/m    Subjective:    Patient ID: Nancy Stout, female    DOB: 1965/09/21, 55 y.o.   MRN: [id number]  HPI: Nancy Stout is a 55 y.o. female  Chief Complaint  Patient presents with   Hypertension   Hyperlipidemia   COPD   Urticaria   Hives has been bad. Has only been taking benadryl and singulair. Has not seen allergy yet.   HYPERTENSION / HYPERLIPIDEMIA Satisfied with current treatment? yes Duration of hypertension: chronic BP medication side effects: no Past BP meds: lisinopril Duration of hyperlipidemia: chronic Cholesterol medication side effects: no Cholesterol supplements: none Past cholesterol medications: pravastatin Medication compliance: excellent compliance Aspirin: no Recent stressors: no Recurrent headaches: no Visual changes: no Palpitations: no Dyspnea: no Chest pain: no Lower extremity edema: no Dizzy/lightheaded: no  SMOKING CESSATION Smoking Status: current every day smoking Smoking Amount: 1+ ppd Smoking Onset: 55yo  Smoking Quit Date: not set Smoking triggers: stress  Type of tobacco use: cigarettes Children in the house: yes Other household members who smoke: yes Treatments attempted: patches Pneumovax: UTD  Relevant past medical, surgical, family and social history reviewed and updated as indicated. Interim medical history since our last visit reviewed. Allergies and medications reviewed and updated.   Review of Systems  Constitutional: Negative.  Negative for chills, diaphoresis, fever, malaise/fatigue and weight loss.  HENT: Negative.    Eyes: Negative.   Respiratory:  Positive for cough and wheezing. Negative for hemoptysis, sputum production and shortness of breath.   Cardiovascular:  Positive for leg swelling. Negative for chest pain, palpitations, orthopnea,  claudication and PND.  Gastrointestinal:  Positive for diarrhea. Negative for abdominal pain, blood in stool, constipation, heartburn, melena, nausea and vomiting.  Genitourinary: Negative.   Musculoskeletal: Negative.   Skin:  Positive for itching and rash.  Neurological: Negative.   Endo/Heme/Allergies:  Negative for environmental allergies and polydipsia. Bruises/bleeds easily.  Psychiatric/Behavioral: Negative.    All other ROS negative except what is listed above and in the HPI.   Per HPI unless specifically indicated above     Objective:    BP 106/69   Pulse 97   Temp 98.5 F (36.9 C)   Ht 5' (1.524 m)   Wt 230 lb 12.8 oz (104.7 kg)   LMP 08/02/2017 (Exact Date)   SpO2 96%   BMI 45.08 kg/m   Wt Readings from Last 3 Encounters:  02/25/21 230 lb 12.8 oz (104.7 kg)  02/08/21 229 lb 3.2 oz (104 kg)  01/07/21 229 lb (103.9 kg)    Physical Exam Vitals and nursing note reviewed.  Constitutional:      General: She is not in acute distress.    Appearance: Normal appearance. She is not ill-appearing, toxic-appearing or diaphoretic.  HENT:     Head: Normocephalic and atraumatic.     Right Ear: External ear normal.     Left Ear: External ear normal.     Nose: Nose normal.     Mouth/Throat:     Mouth: Mucous membranes are moist.     Pharynx: Oropharynx is clear.  Eyes:     General: No scleral icterus.       Right eye: No discharge.        Left eye: No discharge.  Extraocular Movements: Extraocular movements intact.     Conjunctiva/sclera: Conjunctivae normal.     Pupils: Pupils are equal, round, and reactive to light.  Cardiovascular:     Rate and Rhythm: Normal rate and regular rhythm.     Pulses: Normal pulses.     Heart sounds: Normal heart sounds. No murmur heard.   No friction rub. No gallop.  Pulmonary:     Effort: Pulmonary effort is normal. No respiratory distress.     Breath sounds: Normal breath sounds. No stridor. No wheezing, rhonchi or rales.  Chest:      Chest wall: No tenderness.  Musculoskeletal:        General: Normal range of motion.     Cervical back: Normal range of motion and neck supple.  Skin:    General: Skin is warm and dry.     Capillary Refill: Capillary refill takes less than 2 seconds.     Coloration: Skin is not jaundiced or pale.     Findings: No bruising, erythema, lesion or rash.  Neurological:     General: No focal deficit present.     Mental Status: She is alert and oriented to person, place, and time. Mental status is at baseline.  Psychiatric:        Mood and Affect: Mood normal.        Behavior: Behavior normal.        Thought Content: Thought content normal.        Judgment: Judgment normal.    Results for orders placed or performed in visit on 01/07/21  C-reactive protein  Result Value Ref Range   CRP 12 (H) 0 - 10 mg/L  Sed Rate (ESR)  Result Value Ref Range   Sed Rate 8 0 - 40 mm/hr  Systemic Lupus Profile A  Result Value Ref Range   ENA RNP Ab 0.2 0.0 - 0.9 AI   ENA SM Ab Ser-aCnc <0.2 0.0 - 0.9 AI   Rhuematoid fact SerPl-aCnc 11.3 <14.0 IU/mL   Chromatin Ab SerPl-aCnc <0.2 0.0 - 0.9 AI   ENA SSA (RO) Ab <0.2 0.0 - 0.9 AI   ENA SSB (LA) Ab <0.2 0.0 - 0.9 AI   dsDNA Ab 11 (H) 0 - 9 IU/mL  Antinuclear Antib (ANA)  Result Value Ref Range   Anti Nuclear Antibody (ANA) Positive (A) Negative  CBC w/Diff  Result Value Ref Range   WBC 7.2 3.4 - 10.8 x10E3/uL   RBC 5.27 3.77 - 5.28 x10E6/uL   Hemoglobin 17.1 (H) 11.1 - 15.9 g/dL   Hematocrit 49.4 (H) 34.0 - 46.6 %   MCV 94 79 - 97 fL   MCH 32.4 26.6 - 33.0 pg   MCHC 34.6 31.5 - 35.7 g/dL   RDW 12.0 11.7 - 15.4 %   Platelets 271 150 - 450 x10E3/uL   Neutrophils 59 Not Estab. %   Lymphs 28 Not Estab. %   Monocytes 9 Not Estab. %   Eos 4 Not Estab. %   Basos 0 Not Estab. %   Neutrophils Absolute 4.2 1.4 - 7.0 x10E3/uL   Lymphocytes Absolute 2.0 0.7 - 3.1 x10E3/uL   Monocytes Absolute 0.7 0.1 - 0.9 x10E3/uL   EOS (ABSOLUTE) 0.3 0.0 - 0.4  x10E3/uL   Basophils Absolute 0.0 0.0 - 0.2 x10E3/uL   Immature Granulocytes 0 Not Estab. %   Immature Grans (Abs) 0.0 0.0 - 0.1 x10E3/uL  Comp Met (CMET)  Result Value Ref Range   Glucose 94 70 -  99 mg/dL   BUN 18 6 - 24 mg/dL   Creatinine, Ser 0.74 0.57 - 1.00 mg/dL   eGFR 95 >59 mL/min/1.73   BUN/Creatinine Ratio 24 (H) 9 - 23   Sodium 139 134 - 144 mmol/L   Potassium 4.6 3.5 - 5.2 mmol/L   Chloride 100 96 - 106 mmol/L   CO2 23 20 - 29 mmol/L   Calcium 9.6 8.7 - 10.2 mg/dL   Total Protein 7.1 6.0 - 8.5 g/dL   Albumin 4.5 3.8 - 4.9 g/dL   Globulin, Total 2.6 1.5 - 4.5 g/dL   Albumin/Globulin Ratio 1.7 1.2 - 2.2   Bilirubin Total 0.5 0.0 - 1.2 mg/dL   Alkaline Phosphatase 112 44 - 121 IU/L   AST 24 0 - 40 IU/L   ALT 23 0 - 32 IU/L  Lyme disease, western blot  Result Value Ref Range     IgG P93 Ab. Absent      IgG P66 Ab. Absent      IgG P58 Ab. Present (A)      IgG P45 Ab. Absent      IgG P41 Ab. Absent      IgG P39 Ab. Absent      IgG P30 Ab. Absent      IgG P28 Ab. Present (A)      IgG P23 Ab. Absent      IgG P18 Ab. Absent    Lyme IgG Wb Negative      IgM P41 Ab. Absent      IgM P39 Ab. Absent      IgM P23 Ab. Absent    Lyme IgM Wb Negative   Rocky mtn spotted fvr abs pnl(IgG+IgM)  Result Value Ref Range   RMSF IgG Negative Negative   RMSF IgM 0.50 0.00 - 0.89 index      Assessment & Plan:   Problem List Items Addressed This Visit       Respiratory   Asthma    Under good control on current regimen. Continue current regimen. Continue to monitor. Call with any concerns. Refills given. Labs drawn today.      Relevant Medications   albuterol (PROVENTIL) (2.5 MG/3ML) 0.083% nebulizer solution   budesonide-formoterol (SYMBICORT) 160-4.5 MCG/ACT inhaler   montelukast (SINGULAIR) 10 MG tablet   albuterol (VENTOLIN HFA) 108 (90 Base) MCG/ACT inhaler   Other Relevant Orders   Comprehensive metabolic panel   COPD (chronic obstructive pulmonary disease) (HCC)     Under good control on current regimen. Continue current regimen. Continue to monitor. Call with any concerns. Refills given. Labs drawn today.       Relevant Medications   cetirizine (ZYRTEC) 10 MG tablet   albuterol (PROVENTIL) (2.5 MG/3ML) 0.083% nebulizer solution   budesonide-formoterol (SYMBICORT) 160-4.5 MCG/ACT inhaler   fluticasone (FLONASE) 50 MCG/ACT nasal spray   montelukast (SINGULAIR) 10 MG tablet   albuterol (VENTOLIN HFA) 108 (90 Base) MCG/ACT inhaler   nicotine (NICODERM CQ) 21 mg/24hr patch   Other Relevant Orders   Comprehensive metabolic panel     Musculoskeletal and Integument   Chronic urticaria    Will add zyrtec and pepcid. Continue singulair. Will check on referral to allergy. Continue to monitor.         Genitourinary   Benign hypertensive renal disease    Under good control on current regimen. Continue current regimen. Continue to monitor. Call with any concerns. Refills given. Labs drawn today.       Relevant Orders   Comprehensive metabolic  panel   TSH   Microalbumin, Urine Waived     Other   Hyperlipidemia - Primary    Under good control on current regimen. Continue current regimen. Continue to monitor. Call with any concerns. Refills given. Labs drawn today.       Relevant Medications   lisinopril (ZESTRIL) 20 MG tablet   pravastatin (PRAVACHOL) 80 MG tablet   Other Relevant Orders   Comprehensive metabolic panel   Lipid Panel w/o Chol/HDL Ratio   Tobacco abuse    Would like to quit. Will start wellbutrin. Call with any concerns. Continue to monitor.       Relevant Orders   CBC with Differential/Platelet   Comprehensive metabolic panel   Urinalysis, Routine w reflex microscopic   Polycythemia    Rechecking labs today. Await results. Treat as needed.       Relevant Orders   CBC with Differential/Platelet   Comprehensive metabolic panel   Other Visit Diagnoses     Chronic diarrhea       Will check stool studies. Await  results. Callw ith any concerns.    Relevant Orders   Comprehensive metabolic panel   Stool C-Diff Toxin Assay   Fecal occult blood, imunochemical([facility])   Ova and parasite examination   Stool Culture   Fecal leukocytes   Screening for colon cancer       Cologuard ordered today.   Relevant Orders   Cologuard   Encounter for screening mammogram for malignant neoplasm of breast       Mammogram ordered today.        Follow up plan: Return in about 2 weeks (around 03/11/2021), or pap.

## 2021-02-25 NOTE — Assessment & Plan Note (Signed)
Will add zyrtec and pepcid. Continue singulair. Will check on referral to allergy. Continue to monitor.

## 2021-02-25 NOTE — Assessment & Plan Note (Signed)
Rechecking labs today. Await results. Treat as needed.  °

## 2021-02-25 NOTE — Patient Instructions (Signed)
Please call to schedule your mammogram and/or bone density: °[facility]  °Address: [address], [place], [place] [address]  °Phone: [phone] ° °

## 2021-02-25 NOTE — Assessment & Plan Note (Signed)
Would like to quit. Will start wellbutrin. Call with any concerns. Continue to monitor.

## 2021-02-25 NOTE — Progress Notes (Deleted)
BP 106/69   Pulse 97   Temp 98.5 F (36.9 C)   Ht 5' (1.524 m)   Wt 230 lb 12.8 oz (104.7 kg)   LMP 08/02/2017 (Exact Date)   SpO2 96%   BMI 45.08 kg/m    Subjective:    Patient ID: Nancy Stout, female    DOB: 1966-04-06, 55 y.o.   MRN: [id number]  HPI: Nancy Stout is a 55 y.o. female presenting on 02/25/2021 for comprehensive medical examination. Current medical complaints include:       Menopausal Symptoms: no  Depression Screen done today and results listed below:  Depression screen [facility] 2/9 02/25/2021 09/11/2020 12/21/2019 08/03/2017 05/06/2017  Decreased Interest 0 0 0 0 0  Down, Depressed, Hopeless 0 0 0 0 0  PHQ - 2 Score 0 0 0 0 0  Altered sleeping 2 - 0 - 0  Tired, decreased energy 2 - 1 - 1  Change in appetite 0 - 0 - 0  Feeling bad or failure about yourself  0 - 0 - 0  Trouble concentrating 0 - 0 - 0  Moving slowly or fidgety/restless 0 - 0 - 0  Suicidal thoughts 0 - 0 - 0  PHQ-9 Score 4 - 1 - 1  Difficult doing work/chores - - Not difficult at all - -    Past Medical History:  Past Medical History:  Diagnosis Date   Asthma    COPD (chronic obstructive pulmonary disease) (HCC)    GERD (gastroesophageal reflux disease)    Hyperlipidemia    Hypertension    Low blood potassium    Due to Stomach Virus   Low serum sodium    Due to stomach virus    Surgical History:  Past Surgical History:  Procedure Laterality Date   CESAREAN SECTION     INCISION AND DRAINAGE ABSCESS Left    leg   TONSILLECTOMY      Medications:  Current Outpatient Medications on File Prior to Visit  Medication Sig   albuterol (PROVENTIL) (2.5 MG/3ML) 0.083% nebulizer solution Take 3 mLs (2.5 mg total) by nebulization every 4 (four) hours as needed for wheezing or shortness of breath.   albuterol (VENTOLIN HFA) 108 (90 Base) MCG/ACT inhaler TAKE 2 PUFFS BY MOUTH EVERY 4 HOURS AS NEEDED   budesonide-formoterol (SYMBICORT) 160-4.5 MCG/ACT inhaler Inhale 2 puffs into the  lungs 2 (two) times daily.   fluticasone (FLONASE) 50 MCG/ACT nasal spray SPRAY 2 SPRAYS INTO EACH NOSTRIL EVERY DAY   gabapentin (NEURONTIN) 400 MG capsule Take 1 capsule (400 mg total) by mouth 3 (three) times daily.   Glucosamine-Chondroitin (JOINT PAIN FORMULA PO) Take by mouth as needed.   lisinopril (ZESTRIL) 20 MG tablet TAKE 1 TABLET BY MOUTH EVERY DAY   montelukast (SINGULAIR) 10 MG tablet Take 1 tablet (10 mg total) by mouth at bedtime.   Multiple Vitamin (MULTIVITAMIN) tablet Take 1 tablet by mouth daily.   naproxen (NAPROSYN) 500 MG tablet Take 1 tablet (500 mg total) by mouth 2 (two) times daily with a meal.   omeprazole (PRILOSEC) 20 MG capsule TAKE 1 CAPSULE BY MOUTH EVERY DAY   pravastatin (PRAVACHOL) 80 MG tablet TAKE 1 TABLET BY MOUTH EVERY DAY   No current facility-administered medications on file prior to visit.    Allergies:  Allergies  Allergen Reactions   Pravastatin Rash   Sulfa Antibiotics Rash   Spiriva Respimat [Tiotropium Bromide Monohydrate] Other (See Comments)    Joint pain    Social History:  Social History   Socioeconomic History   Marital status: Single    Spouse name: Not on file   Number of children: Not on file   Years of education: Not on file   Highest education level: Not on file  Occupational History   Not on file  Tobacco Use   Smoking status: Every Day    Packs/day: 1.00    Years: 30.00    Pack years: 30.00    Types: Cigarettes   Smokeless tobacco: Never  Vaping Use   Vaping Use: Never used  Substance and Sexual Activity   Alcohol use: Yes    Alcohol/week: 0.0 standard drinks    Comment: one or less per day   Drug use: No   Sexual activity: Yes    Birth control/protection: None  Other Topics Concern   Not on file  Social History Narrative   Not on file   Social Determinants of Health   Financial Resource Strain: Not on file  Food Insecurity: Not on file  Transportation Needs: Not on file  Physical Activity: Not on  file  Stress: Not on file  Social Connections: Not on file  Intimate Partner Violence: Not on file   Social History   Tobacco Use  Smoking Status Every Day   Packs/day: 1.00   Years: 30.00   Pack years: 30.00   Types: Cigarettes  Smokeless Tobacco Never   Social History   Substance and Sexual Activity  Alcohol Use Yes   Alcohol/week: 0.0 standard drinks   Comment: one or less per day    Family History:  Family History  Problem Relation Age of Onset   Hyperlipidemia Mother    Hypertension Mother    Cancer Mother    Cancer Father        Lung   Hypertension Father    Hyperlipidemia Sister    Drug abuse Son    Breast cancer Sister     Past medical history, surgical history, medications, allergies, family history and social history reviewed with patient today and changes made to appropriate areas of the chart.   Review of Systems  Constitutional: Negative.  Negative for chills, diaphoresis, fever, malaise/fatigue and weight loss.  HENT: Negative.    Eyes: Negative.   Respiratory:  Positive for cough and wheezing. Negative for hemoptysis, sputum production and shortness of breath.   Cardiovascular:  Positive for leg swelling. Negative for chest pain, palpitations, orthopnea, claudication and PND.  Gastrointestinal:  Positive for diarrhea. Negative for abdominal pain, blood in stool, constipation, heartburn, melena, nausea and vomiting.  Genitourinary: Negative.   Musculoskeletal: Negative.   Skin:  Positive for itching and rash.  Neurological: Negative.   Endo/Heme/Allergies:  Negative for environmental allergies and polydipsia. Bruises/bleeds easily.  Psychiatric/Behavioral: Negative.    All other ROS negative except what is listed above and in the HPI.      Objective:    BP 106/69   Pulse 97   Temp 98.5 F (36.9 C)   Ht 5' (1.524 m)   Wt 230 lb 12.8 oz (104.7 kg)   LMP 08/02/2017 (Exact Date)   SpO2 96%   BMI 45.08 kg/m   Wt Readings from Last 3  Encounters:  02/25/21 230 lb 12.8 oz (104.7 kg)  02/08/21 229 lb 3.2 oz (104 kg)  01/07/21 229 lb (103.9 kg)    Physical Exam  Results for orders placed or performed in visit on 01/07/21  C-reactive protein  Result Value Ref Range   CRP 12 (  H) 0 - 10 mg/L  Sed Rate (ESR)  Result Value Ref Range   Sed Rate 8 0 - 40 mm/hr  Systemic Lupus Profile A  Result Value Ref Range   ENA RNP Ab 0.2 0.0 - 0.9 AI   ENA SM Ab Ser-aCnc <0.2 0.0 - 0.9 AI   Rhuematoid fact SerPl-aCnc 11.3 <14.0 IU/mL   Chromatin Ab SerPl-aCnc <0.2 0.0 - 0.9 AI   ENA SSA (RO) Ab <0.2 0.0 - 0.9 AI   ENA SSB (LA) Ab <0.2 0.0 - 0.9 AI   dsDNA Ab 11 (H) 0 - 9 IU/mL  Antinuclear Antib (ANA)  Result Value Ref Range   Anti Nuclear Antibody (ANA) Positive (A) Negative  CBC w/Diff  Result Value Ref Range   WBC 7.2 3.4 - 10.8 x10E3/uL   RBC 5.27 3.77 - 5.28 x10E6/uL   Hemoglobin 17.1 (H) 11.1 - 15.9 g/dL   Hematocrit 49.4 (H) 34.0 - 46.6 %   MCV 94 79 - 97 fL   MCH 32.4 26.6 - 33.0 pg   MCHC 34.6 31.5 - 35.7 g/dL   RDW 12.0 11.7 - 15.4 %   Platelets 271 150 - 450 x10E3/uL   Neutrophils 59 Not Estab. %   Lymphs 28 Not Estab. %   Monocytes 9 Not Estab. %   Eos 4 Not Estab. %   Basos 0 Not Estab. %   Neutrophils Absolute 4.2 1.4 - 7.0 x10E3/uL   Lymphocytes Absolute 2.0 0.7 - 3.1 x10E3/uL   Monocytes Absolute 0.7 0.1 - 0.9 x10E3/uL   EOS (ABSOLUTE) 0.3 0.0 - 0.4 x10E3/uL   Basophils Absolute 0.0 0.0 - 0.2 x10E3/uL   Immature Granulocytes 0 Not Estab. %   Immature Grans (Abs) 0.0 0.0 - 0.1 x10E3/uL  Comp Met (CMET)  Result Value Ref Range   Glucose 94 70 - 99 mg/dL   BUN 18 6 - 24 mg/dL   Creatinine, Ser 0.74 0.57 - 1.00 mg/dL   eGFR 95 >59 mL/min/1.73   BUN/Creatinine Ratio 24 (H) 9 - 23   Sodium 139 134 - 144 mmol/L   Potassium 4.6 3.5 - 5.2 mmol/L   Chloride 100 96 - 106 mmol/L   CO2 23 20 - 29 mmol/L   Calcium 9.6 8.7 - 10.2 mg/dL   Total Protein 7.1 6.0 - 8.5 g/dL   Albumin 4.5 3.8 - 4.9 g/dL    Globulin, Total 2.6 1.5 - 4.5 g/dL   Albumin/Globulin Ratio 1.7 1.2 - 2.2   Bilirubin Total 0.5 0.0 - 1.2 mg/dL   Alkaline Phosphatase 112 44 - 121 IU/L   AST 24 0 - 40 IU/L   ALT 23 0 - 32 IU/L  Lyme disease, western blot  Result Value Ref Range     IgG P93 Ab. Absent      IgG P66 Ab. Absent      IgG P58 Ab. Present (A)      IgG P45 Ab. Absent      IgG P41 Ab. Absent      IgG P39 Ab. Absent      IgG P30 Ab. Absent      IgG P28 Ab. Present (A)      IgG P23 Ab. Absent      IgG P18 Ab. Absent    Lyme IgG Wb Negative      IgM P41 Ab. Absent      IgM P39 Ab. Absent      IgM P23 Ab. Absent    Lyme IgM  Wb Negative   Rocky mtn spotted fvr abs pnl(IgG+IgM)  Result Value Ref Range   RMSF IgG Negative Negative   RMSF IgM 0.50 0.00 - 0.89 index      Assessment & Plan:   Problem List Items Addressed This Visit   None Visit Diagnoses     Routine general medical examination at a health care facility    -  Primary   Relevant Orders   CBC with Differential/Platelet   Comprehensive metabolic panel   Lipid Panel w/o Chol/HDL Ratio   Urinalysis, Routine w reflex microscopic   TSH   Microalbumin, Urine Waived   Cytology - PAP        Follow up plan: No follow-ups on file.   LABORATORY TESTING:  - Pap smear: {Blank IOCODI:56788::"BBH done","not applicable","up to date","done elsewhere"}  IMMUNIZATIONS:   - Tdap: Tetanus vaccination status reviewed: {tetanus status:315746}. - Influenza: {Blank single:19197::"Up to date","Administered today","Postponed to flu season","Refused","Given elsewhere"} - Pneumovax: {Blank single:19197::"Up to date","Administered today","Not applicable","Refused","Given elsewhere"} - Prevnar: {Blank single:19197::"Up to date","Administered today","Not applicable","Refused","Given elsewhere"} - COVID: {Blank single:19197::"Up to date","Administered today","Not applicable","Refused","Given elsewhere"} - HPV: {Blank single:19197::"Up to date","Administered  today","Not applicable","Refused","Given elsewhere"} - Shingrix vaccine: {Blank single:19197::"Up to date","Administered today","Not applicable","Refused","Given elsewhere"}  SCREENING: -Mammogram: {Blank single:19197::"Up to date","Ordered today","Not applicable","Refused","Done elsewhere"}  - Colonoscopy: {Blank single:19197::"Up to date","Ordered today","Not applicable","Refused","Done elsewhere"}  - Bone Density: {Blank single:19197::"Up to date","Ordered today","Not applicable","Refused","Done elsewhere"}  -Hearing Test: {Blank single:19197::"Up to date","Ordered today","Not applicable","Refused","Done elsewhere"}  -Spirometry: {Blank single:19197::"Up to date","Ordered today","Not applicable","Refused","Done elsewhere"}   PATIENT COUNSELING:   Advised to take 1 mg of folate supplement per day if capable of pregnancy.   Sexuality: Discussed sexually transmitted diseases, partner selection, use of condoms, avoidance of unintended pregnancy  and contraceptive alternatives.   Advised to avoid cigarette smoking.  I discussed with the patient that most people either abstain from alcohol or drink within safe limits (<=14/week and <=4 drinks/occasion for males, <=7/weeks and <= 3 drinks/occasion for females) and that the risk for alcohol disorders and other health effects rises proportionally with the number of drinks per week and how often a drinker exceeds daily limits.  Discussed cessation/primary prevention of drug use and availability of treatment for abuse.   Diet: Encouraged to adjust caloric intake to maintain  or achieve ideal body weight, to reduce intake of dietary saturated fat and total fat, to limit sodium intake by avoiding high sodium foods and not adding table salt, and to maintain adequate dietary potassium and calcium preferably from fresh fruits, vegetables, and low-fat dairy products.    stressed the importance of regular exercise  Injury prevention: Discussed safety  belts, safety helmets, smoke detector, smoking near bedding or upholstery.   Dental health: Discussed importance of regular tooth brushing, flossing, and dental visits.    NEXT PREVENTATIVE PHYSICAL DUE IN 1 YEAR. No follow-ups on file.

## 2021-02-26 ENCOUNTER — Encounter: Payer: Self-pay | Admitting: Family Medicine

## 2021-02-26 LAB — CBC WITH DIFFERENTIAL/PLATELET
Basophils Absolute: 0 10*3/uL (ref 0.0–0.2)
Basos: 1 %
EOS (ABSOLUTE): 0.2 10*3/uL (ref 0.0–0.4)
Eos: 3 %
Hematocrit: 46.1 % (ref 34.0–46.6)
Hemoglobin: 16 g/dL — ABNORMAL HIGH (ref 11.1–15.9)
Immature Grans (Abs): 0 10*3/uL (ref 0.0–0.1)
Immature Granulocytes: 0 %
Lymphocytes Absolute: 1.1 10*3/uL (ref 0.7–3.1)
Lymphs: 16 %
MCH: 33.3 pg — ABNORMAL HIGH (ref 26.6–33.0)
MCHC: 34.7 g/dL (ref 31.5–35.7)
MCV: 96 fL (ref 79–97)
Monocytes Absolute: 0.7 10*3/uL (ref 0.1–0.9)
Monocytes: 9 %
Neutrophils Absolute: 5.1 10*3/uL (ref 1.4–7.0)
Neutrophils: 71 %
Platelets: 248 10*3/uL (ref 150–450)
RBC: 4.8 x10E6/uL (ref 3.77–5.28)
RDW: 13.1 % (ref 11.7–15.4)
WBC: 7.2 10*3/uL (ref 3.4–10.8)

## 2021-02-26 LAB — COMPREHENSIVE METABOLIC PANEL
ALT: 32 IU/L (ref 0–32)
AST: 33 IU/L (ref 0–40)
Albumin/Globulin Ratio: 2 (ref 1.2–2.2)
Albumin: 4.3 g/dL (ref 3.8–4.9)
Alkaline Phosphatase: 104 IU/L (ref 44–121)
BUN/Creatinine Ratio: 32 — ABNORMAL HIGH (ref 9–23)
BUN: 17 mg/dL (ref 6–24)
Bilirubin Total: 0.3 mg/dL (ref 0.0–1.2)
CO2: 25 mmol/L (ref 20–29)
Calcium: 9.3 mg/dL (ref 8.7–10.2)
Chloride: 99 mmol/L (ref 96–106)
Creatinine, Ser: 0.53 mg/dL — ABNORMAL LOW (ref 0.57–1.00)
Globulin, Total: 2.1 g/dL (ref 1.5–4.5)
Glucose: 94 mg/dL (ref 70–99)
Potassium: 4.9 mmol/L (ref 3.5–5.2)
Sodium: 138 mmol/L (ref 134–144)
Total Protein: 6.4 g/dL (ref 6.0–8.5)
eGFR: 109 mL/min/{1.73_m2} (ref >59)

## 2021-02-26 LAB — LIPID PANEL W/O CHOL/HDL RATIO
Cholesterol, Total: 198 mg/dL (ref 100–199)
HDL: 88 mg/dL (ref >39)
LDL Chol Calc (NIH): 97 mg/dL (ref 0–99)
Triglycerides: 74 mg/dL (ref 0–149)
VLDL Cholesterol Cal: 13 mg/dL (ref 5–40)

## 2021-02-26 LAB — TSH: TSH: 1.71 u[IU]/mL (ref 0.450–4.500)

## 2021-03-12 ENCOUNTER — Ambulatory Visit: Payer: Medicaid Other | Admitting: Family Medicine

## 2021-03-19 ENCOUNTER — Other Ambulatory Visit: Payer: Self-pay | Admitting: Family Medicine

## 2021-03-19 NOTE — Telephone Encounter (Signed)
Requested Prescriptions  Pending Prescriptions Disp Refills   buPROPion (WELLBUTRIN SR) 150 MG 12 hr tablet [Pharmacy Med Name: BUPROPION HCL SR 150 MG TABLET] 180 tablet 0    Sig: TAKE 1 TAB IN THE AM FOR 1 WEEK, THEN INCREASE TO 1 TAB TWICE A DAY , PICK A DAY IN THE 2ND WEEK TO TRY TO QUIT     Psychiatry: Antidepressants - bupropion Passed - 03/19/2021  1:35 PM      Passed - Last BP in normal range    BP Readings from Last 1 Encounters:  02/25/21 106/69         Passed - Valid encounter within last 6 months    Recent Outpatient Visits          3 weeks ago Mixed hyperlipidemia   [facility] [place], [place], DO   1 month ago Hives   [facility] [place], Clydie Braun, NP   2 months ago Hives   [facility] [place], Clydie Braun, NP   6 months ago Rash due to allergy   [facility] Vigg, Avanti, MD   6 months ago Simple chronic bronchitis [facility])   [facility] [place], [place], DO      Future Appointments            In 3 weeks Laural Benes, Oralia Rud, DO [facility]

## 2021-04-09 ENCOUNTER — Ambulatory Visit: Payer: Medicaid Other | Admitting: Family Medicine

## 2021-04-25 NOTE — Progress Notes (Signed)
Office Visit Note  Patient: Nancy Stout             Date of Birth: October 16, 1965           MRN: [id number]             PCP: Valerie Roys, DO Referring: Jon Billings, NP Visit Date: 04/26/2021   Subjective:  No chief complaint on file.   History of Present Illness: Nancy Stout is a 56 y.o. female here for evaluation of positive ANA with hives and joint and muscle pains.  She has a history of chronic joint pain in multiple areas worst in bilateral knees that has been going on for years but is significantly worse during the past few months.  She started developing hives last summer and affecting her upper legs and torso.  The individual lesions last for a few days at a time before flattening with some residual erythema before eventual complete resolution.  These were extremely itchy while present and would burn or sting after being scratched.  She did not see any residual hyperpigmentation or bruising.  Several brief treatments with prednisone or Solu-Medrol completely resolved the skin rashes before they would return after stopping medicine.  More recently she was started on additional antihistamines with Zyrtec and Pepcid in addition to her usual Singulair and hives not returned on this regimen. Joint pain is doing worse with popping and locking sensations in the left knee intermittently.  In her hands worst affected joints are in both thumbs.  She occasionally notices swelling in the knees but more of the time is stiff and painful without visible changes.  She takes Naprosyn 500 mg as needed which is partially helpful for joint pains but often has persistent aching.  Lab evaluation was positive for ANA antibodies with a low double-stranded DNA titer. Labs also showed erythrocytosis with a hemoglobin of 17.1.  Using CPAP now she is also noticed some improvement to shoulder pain or soreness with sleeping more on her back.  Labs reviewed 02/2021 UA 1+ protein  01/2021 ANA  pos dsDNA 11 RNP, SM, RF, chromatin, SSA, SSB neg ESR 8 CRP 12 CBC Hgb 17.1 CMP unremarkable Lyme WB neg RMSF neg  Activities of Daily Living:  Patient reports morning stiffness for 45 minutes.   Patient Reports nocturnal pain.  Difficulty dressing/grooming: Reports Difficulty climbing stairs: Reports Difficulty getting out of chair: Denies Difficulty using hands for taps, buttons, cutlery, and/or writing: Reports  Review of Systems  Constitutional:  Negative for fatigue.  HENT:  Positive for mouth dryness and nose dryness. Negative for mouth sores.   Eyes:  Negative for pain, itching and dryness.  Respiratory:  Positive for shortness of breath. Negative for difficulty breathing.   Cardiovascular:  Negative for chest pain and palpitations.  Gastrointestinal:  Positive for diarrhea. Negative for blood in stool and constipation.  Endocrine: Negative for increased urination.  Genitourinary:  Negative for difficulty urinating.  Musculoskeletal:  Positive for joint pain, joint pain, joint swelling, myalgias, morning stiffness, muscle tenderness and myalgias.  Skin:  Positive for color change. Negative for rash and redness.  Allergic/Immunologic: Negative for susceptible to infections.  Neurological:  Positive for numbness. Negative for dizziness, headaches, memory loss and weakness.  Hematological:  Positive for bruising/bleeding tendency.  Psychiatric/Behavioral:  Negative for confusion.    PMFS History:  Patient Active Problem List   Diagnosis Date Noted   Positive ANA (antinuclear antibody) 04/26/2021   Bilateral thumb pain 04/26/2021   Chronic urticaria  02/08/2021   COPD (chronic obstructive pulmonary disease) ([place]) 01/04/2019   Osteoarthritis 08/03/2017   Eczema of both hands 07/04/2015   Allergic rhinitis 07/04/2015   Polycythemia 01/22/2015   Tobacco abuse 01/17/2015   Asthma    Benign hypertensive renal disease 01/11/2015   Hyperlipidemia 01/11/2015    Past  Medical History:  Diagnosis Date   Asthma    COPD (chronic obstructive pulmonary disease) (HCC)    GERD (gastroesophageal reflux disease)    Hyperlipidemia    Hypertension    Low blood potassium    Due to Stomach Virus   Low serum sodium    Due to stomach virus    Family History  Problem Relation Age of Onset   Hyperlipidemia Mother    Hypertension Mother    Cancer Mother    Cancer Father        Lung   Hypertension Father    Hyperlipidemia Sister    Breast cancer Sister    Past Surgical History:  Procedure Laterality Date   CESAREAN SECTION     INCISION AND DRAINAGE ABSCESS Left    leg   TONSILLECTOMY     Social History   Social History Narrative   Not on file   Immunization History  Administered Date(s) Administered   Pneumococcal Polysaccharide-23 05/16/2014   Tdap 05/16/2014, 04/06/2017     Objective: Vital Signs: BP 133/83 (BP Location: Right Arm, Patient Position: Sitting, Cuff Size: Large)    Pulse 80    Ht 5' (1.524 m)    Wt 242 lb 3.2 oz (109.9 kg)    LMP 08/02/2017 (Exact Date)    BMI 47.30 kg/m    Physical Exam Constitutional:      Appearance: She is obese.  HENT:     Mouth/Throat:     Mouth: Mucous membranes are moist.     Pharynx: Oropharynx is clear.  Cardiovascular:     Rate and Rhythm: Normal rate and regular rhythm.  Pulmonary:     Effort: Pulmonary effort is normal.     Breath sounds: Normal breath sounds.  Skin:    General: Skin is warm and dry.     Comments: Extensive chronic skin changes with solar lentigines and mixed hyperpigmented and hypopigmented patches on exposed surfaces of arms Normal nailfold capillary appearance  Neurological:     Mental Status: She is alert.  Psychiatric:        Mood and Affect: Mood normal.     Musculoskeletal Exam:  Neck full ROM no tenderness Shoulders full ROM no tenderness or swelling Elbows full ROM no tenderness or swelling Wrists full ROM no tenderness or swelling Fingers full ROM  tenderness to pressure on thumbs worst at MCP joints, some hyperextension present Knees full ROM, left knee tenderness, mild patellofemoral crepitus Ankles full ROM no tenderness or swelling   Investigation: No additional findings.  Imaging: XR Hand 2 View Left  Result Date: 04/27/2021 X-ray left hand 2 views PA and lateral There are extensive cystic changes present throughout carpal bones.  Advanced first [facility] joint osteoarthritis with mild subluxation extensive bone spurring and cystic changes.  Visualized MCP PIP and DIP joint spaces appear normal.  No visible erosions or abnormal calcifications. Impression Advanced first [place] joint osteoarthritis  XR Hand 2 View Right  Result Date: 04/27/2021 X-ray right hand 2 views Radiocarpal joint space appears normal.  Advanced degenerative changes in the first [facility] joint with joint space loss osteophytes cystic changes and mild subluxation.  MCP PIP and DIP  joints spaces appear normal.  No erosions or abnormal calcifications seen.  Bone mineralization suggestive for generalized osteopenia. Impression Advanced first [place] joint osteoarthritis  XR KNEE 3 VIEW LEFT  Result Date: 04/27/2021 X-ray left knee 3 views Medial compartment osteoarthritis with severe loss of joint space and early osteophyte formation.  Patellofemoral joint arthritis on lateral side.  There are no erosions chondrocalcinosis or visible joint effusion. Impression Moderately severe medial compartment osteoarthritis.  Appears advancing from 2019 x-ray also with some increased lateral displacement or varus deformity  XR KNEE 3 VIEW RIGHT  Result Date: 04/27/2021 X-ray right knee 3 views Advanced medial compartment osteoarthritis with nearly complete joint space loss and early osteophytes.  There is patellofemoral joint arthritis on the lateral side with mild deviation.z no erosions chondrocalcinosis or visible joint effusion. Impression There is moderately severe medial compartment  osteoarthritis does not appear significantly progressed compared to 2019 x-ray   Recent Labs: Lab Results  Component Value Date   WBC 7.2 02/25/2021   HGB 16.0 (H) 02/25/2021   PLT 248 02/25/2021   NA 138 02/25/2021   K 4.9 02/25/2021   CL 99 02/25/2021   CO2 25 02/25/2021   GLUCOSE 94 02/25/2021   BUN 17 02/25/2021   CREATININE 0.53 (L) 02/25/2021   BILITOT 0.3 02/25/2021   ALKPHOS 104 02/25/2021   AST 33 02/25/2021   ALT 32 02/25/2021   PROT 6.4 02/25/2021   ALBUMIN 4.3 02/25/2021   CALCIUM 9.3 02/25/2021   GFRAA 123 12/21/2019    Speciality Comments: No specialty comments available.  Procedures:  No procedures performed Allergies: Pravastatin, Sulfa antibiotics, and Spiriva respimat [tiotropium bromide monohydrate]   Assessment / Plan:     Visit Diagnoses: Positive ANA (antinuclear antibody) Chronic urticaria - Plan: Anti-DNA antibody, double-stranded, ANA, C3 and C4  Chronic urticaria for about 6 months with positive autoantibodies no other concerning features described so far.  We will check ANA for concentration titer repeat the double-stranded DNA also checking serum complements.  Overall do not see other specific clinical criteria for systemic lupus or related autoimmune disorders.  Markers for Sjogren's syndrome as a cause of rash arthralgia and dry eyes already previously negative and no visible oral changes.  Primary osteoarthritis involving multiple joints - Plan: XR Hand 2 View Right, XR Hand 2 View Left, XR KNEE 3 VIEW RIGHT, XR KNEE 3 VIEW LEFT  Joint pains involving multiple sites worst in the hands and knees obtaining updated x-rays of the knees and new x-rays in the hands with no prior comparator.  Changes are consistent with advanced osteoarthritis of the first [facility] joint of both hands.  In the knees there is moderately advanced osteoarthritis of the right knee not much difference compared to 2019 films left knee appears to have some progression with worsening  joint space loss and varus deformity.  Orders: Orders Placed This Encounter  Procedures   XR Hand 2 View Right   XR Hand 2 View Left   XR KNEE 3 VIEW RIGHT   XR KNEE 3 VIEW LEFT   Anti-DNA antibody, double-stranded   ANA   C3 and C4   No orders of the defined types were placed in this encounter.   Follow-Up Instructions: Return in about 2 weeks (around 05/10/2021) for New pt OA/?dsDNA+hives f/u 2wks.   Collier Salina, MD  Note - This record has been created using [employer].  Chart creation errors have been sought, but may not always  have been located. Such creation  errors do not reflect on  the standard of medical care.

## 2021-04-26 ENCOUNTER — Ambulatory Visit: Payer: Self-pay

## 2021-04-26 ENCOUNTER — Encounter: Payer: Self-pay | Admitting: Internal Medicine

## 2021-04-26 ENCOUNTER — Ambulatory Visit (INDEPENDENT_AMBULATORY_CARE_PROVIDER_SITE_OTHER): Payer: Medicaid Other | Admitting: Internal Medicine

## 2021-04-26 ENCOUNTER — Other Ambulatory Visit: Payer: Self-pay

## 2021-04-26 VITALS — BP 133/83 | HR 80 | Ht 60.0 in | Wt 242.2 lb

## 2021-04-26 DIAGNOSIS — M159 Polyosteoarthritis, unspecified: Secondary | ICD-10-CM | POA: Diagnosis not present

## 2021-04-26 DIAGNOSIS — M79644 Pain in right finger(s): Secondary | ICD-10-CM

## 2021-04-26 DIAGNOSIS — L508 Other urticaria: Secondary | ICD-10-CM

## 2021-04-26 DIAGNOSIS — R768 Other specified abnormal immunological findings in serum: Secondary | ICD-10-CM | POA: Diagnosis not present

## 2021-04-26 DIAGNOSIS — M25561 Pain in right knee: Secondary | ICD-10-CM

## 2021-04-26 DIAGNOSIS — M25562 Pain in left knee: Secondary | ICD-10-CM | POA: Diagnosis not present

## 2021-04-26 DIAGNOSIS — M79645 Pain in left finger(s): Secondary | ICD-10-CM

## 2021-04-29 ENCOUNTER — Other Ambulatory Visit: Payer: Self-pay | Admitting: Family Medicine

## 2021-04-29 LAB — C3 AND C4
C3 Complement: 129 mg/dL (ref 83–193)
C4 Complement: 26 mg/dL (ref 15–57)

## 2021-04-29 LAB — ANA: Anti Nuclear Antibody (ANA): NEGATIVE

## 2021-04-29 LAB — ANTI-DNA ANTIBODY, DOUBLE-STRANDED: ds DNA Ab: 11 IU/mL — ABNORMAL HIGH

## 2021-04-29 NOTE — Telephone Encounter (Signed)
Requested Prescriptions  Pending Prescriptions Disp Refills   nicotine (NICODERM CQ - DOSED IN MG/24 HOURS) 21 mg/24hr patch [Pharmacy Med Name: NICOTINE 21 MG/24HR PATCH] 28 patch 2    Sig: PLACE 1 PATCH ONTO THE SKIN DAILY.     Psychiatry:  Drug Dependence Therapy Passed - 04/29/2021 10:22 AM      Passed - Valid encounter within last 12 months    Recent Outpatient Visits          2 months ago Mixed hyperlipidemia   [facility] [place], [place], DO   2 months ago Hives   [facility] Larae Grooms, NP   3 months ago Hives   [facility] [place], Clydie Braun, NP   7 months ago Rash due to allergy   [facility] Vigg, Avanti, MD   8 months ago Simple chronic bronchitis [facility])   [facility] Dorcas Carrow, DO      Future Appointments            In 1 week Rice, Jamesetta [place], MD [facility] Rheumatology

## 2021-05-09 NOTE — Progress Notes (Signed)
Office Visit Note  Patient: Nancy Stout             Date of Birth: [birth year]           MRN: [id number]             PCP: Valerie Roys, DO Referring: Valerie Roys, DO Visit Date: 05/10/2021   Subjective:  followup-review labs (Working on losing weight, still having stiffness in hands and knees. Having swelling in right foot )   History of Present Illness: Nancy Stout is a 56 y.o. female here for follow up for positive ANA with hives and joint and muscle pains. Labs at initial visit showing a borderline dsDNA of 11 but with a negative ANA and normal serum complements. Xrays of bilateral hands showing significant OA worst in 1st CMC joints. Xrays of knees showed moderate to severe OA changes with some likely progression compared to 2019 images. She continues having about the same symptoms overall she notices some swelling in the right foot. She thinks the singulair and zyrtec is not working quite as completely as when she first started these.  Previous HPI 04/26/21 Nancy Stout is a 56 y.o. female here for evaluation of positive ANA with hives and joint and muscle pains.  She has a history of chronic joint pain in multiple areas worst in bilateral knees that has been going on for years but is significantly worse during the past few months.  She started developing hives last summer and affecting her upper legs and torso.  The individual lesions last for a few days at a time before flattening with some residual erythema before eventual complete resolution.  These were extremely itchy while present and would burn or sting after being scratched.  She did not see any residual hyperpigmentation or bruising.  Several brief treatments with prednisone or Solu-Medrol completely resolved the skin rashes before they would return after stopping medicine.  More recently she was started on additional antihistamines with Zyrtec and Pepcid in addition to her usual Singulair and hives not  returned on this regimen. Joint pain is doing worse with popping and locking sensations in the left knee intermittently.  In her hands worst affected joints are in both thumbs.  She occasionally notices swelling in the knees but more of the time is stiff and painful without visible changes.  She takes Naprosyn 500 mg as needed which is partially helpful for joint pains but often has persistent aching.  Lab evaluation was positive for ANA antibodies with a low double-stranded DNA titer. Labs also showed erythrocytosis with a hemoglobin of 17.1.  Using CPAP now she is also noticed some improvement to shoulder pain or soreness with sleeping more on her back.   Labs reviewed 02/2021 UA 1+ protein   01/2021 ANA pos dsDNA 11 RNP, SM, RF, chromatin, SSA, SSB neg ESR 8 CRP 12 CBC Hgb 17.1 CMP unremarkable Lyme WB neg RMSF neg   Review of Systems  Constitutional:  Negative for fever.  Cardiovascular:  Positive for swelling in legs/feet.  Musculoskeletal:  Positive for joint pain, joint pain and joint swelling.  Skin:  Positive for rash.  Hematological:  Negative for swollen glands.   PMFS History:  Patient Active Problem List   Diagnosis Date Noted   Bilateral knee pain 05/10/2021   Positive ANA (antinuclear antibody) 04/26/2021   Bilateral thumb pain 04/26/2021   Chronic urticaria 02/08/2021   COPD (chronic obstructive pulmonary disease) ([place]) 01/04/2019   Osteoarthritis 08/03/2017  Eczema of both hands 07/04/2015   Allergic rhinitis 07/04/2015   Polycythemia 01/22/2015   Tobacco abuse 01/17/2015   Asthma    Benign hypertensive renal disease 01/11/2015   Hyperlipidemia 01/11/2015    Past Medical History:  Diagnosis Date   Asthma    COPD (chronic obstructive pulmonary disease) (HCC)    GERD (gastroesophageal reflux disease)    Hyperlipidemia    Hypertension    Low blood potassium    Due to Stomach Virus   Low serum sodium    Due to stomach virus    Family History   Problem Relation Age of Onset   Hyperlipidemia Mother    Hypertension Mother    Cancer Mother    Cancer Father        Lung   Hypertension Father    Hyperlipidemia Sister    Breast cancer Sister    Past Surgical History:  Procedure Laterality Date   CESAREAN SECTION     INCISION AND DRAINAGE ABSCESS Left    leg   TONSILLECTOMY     Social History   Social History Narrative   Not on file   Immunization History  Administered Date(s) Administered   Pneumococcal Polysaccharide-23 05/16/2014   Tdap 05/16/2014, 04/06/2017     Objective: Vital Signs: BP 121/82    Pulse 91    Ht 5' (1.524 m)    Wt 238 lb 6.4 oz (108.1 kg)    LMP 08/02/2017 (Exact Date)    BMI 46.56 kg/m    Physical Exam Constitutional:      Appearance: She is obese.  Cardiovascular:     Rate and Rhythm: Normal rate and regular rhythm.  Pulmonary:     Effort: Pulmonary effort is normal.     Breath sounds: Normal breath sounds.  Skin:    General: Skin is warm and dry.  Neurological:     Mental Status: She is alert.  Psychiatric:        Mood and Affect: Mood normal.     Musculoskeletal Exam:  Elbows full ROM no tenderness or swelling Wrists full ROM no tenderness or swelling Fingers full ROM bilateral tenderness on thumbs CMC and MCP joints without significant synovitis Bilateral knee patellofemoral crepitus, left knee tenderness worst medial joint line Ankles full ROM no tenderness or swelling  Investigation: No additional findings.  Imaging: XR Hand 2 View Left  Result Date: 04/27/2021 X-ray left hand 2 views PA and lateral There are extensive cystic changes present throughout carpal bones.  Advanced first [facility] joint osteoarthritis with mild subluxation extensive bone spurring and cystic changes.  Visualized MCP PIP and DIP joint spaces appear normal.  No visible erosions or abnormal calcifications. Impression Advanced first [place] joint osteoarthritis  XR Hand 2 View Right  Result Date:  04/27/2021 X-ray right hand 2 views Radiocarpal joint space appears normal.  Advanced degenerative changes in the first [facility] joint with joint space loss osteophytes cystic changes and mild subluxation.  MCP PIP and DIP joints spaces appear normal.  No erosions or abnormal calcifications seen.  Bone mineralization suggestive for generalized osteopenia. Impression Advanced first [place] joint osteoarthritis  XR KNEE 3 VIEW LEFT  Result Date: 04/27/2021 X-ray left knee 3 views Medial compartment osteoarthritis with severe loss of joint space and early osteophyte formation.  Patellofemoral joint arthritis on lateral side.  There are no erosions chondrocalcinosis or visible joint effusion. Impression Moderately severe medial compartment osteoarthritis.  Appears advancing from 2019 x-ray also with some increased lateral displacement or varus  deformity  XR KNEE 3 VIEW RIGHT  Result Date: 04/27/2021 X-ray right knee 3 views Advanced medial compartment osteoarthritis with nearly complete joint space loss and early osteophytes.  There is patellofemoral joint arthritis on the lateral side with mild deviation.z no erosions chondrocalcinosis or visible joint effusion. Impression There is moderately severe medial compartment osteoarthritis does not appear significantly progressed compared to 2019 x-ray   Recent Labs: Lab Results  Component Value Date   WBC 7.2 02/25/2021   HGB 16.0 (H) 02/25/2021   PLT 248 02/25/2021   NA 138 02/25/2021   K 4.9 02/25/2021   CL 99 02/25/2021   CO2 25 02/25/2021   GLUCOSE 94 02/25/2021   BUN 17 02/25/2021   CREATININE 0.53 (L) 02/25/2021   BILITOT 0.3 02/25/2021   ALKPHOS 104 02/25/2021   AST 33 02/25/2021   ALT 32 02/25/2021   PROT 6.4 02/25/2021   ALBUMIN 4.3 02/25/2021   CALCIUM 9.3 02/25/2021   GFRAA 123 12/21/2019    Speciality Comments: No specialty comments available.  Procedures:  No procedures performed Allergies: Pravastatin, Sulfa antibiotics, and Spiriva  respimat [tiotropium bromide monohydrate]   Assessment / Plan:     Visit Diagnoses: Primary osteoarthritis involving multiple joints Chronic pain of both knees  Bilateral knee pain, xrays show right side stable left side with interval worsening from 2019. If she has meniscal insufficiency here may be contributing. I recommended follow up with her orthopedist for this.  Bilateral thumb pain  Appears consistent with thumb OA we reviewed treatment options for this including OTC medication, supplements, prescription treatments, orthotics, or options for injection or OT referral if functional worsening. No specific new medical intervention started today.  Chronic urticaria  I think this would remain idiopathic cannot claim a secondary underlying condition from workup here. Her symptoms are largely controlled currently on antihistamines and singulair although she notices ongoing symptoms.  Positive ANA (antinuclear antibody)  Very low positive dsDNA, with normal complements and other antibody tests negative. Most unusually ANA test was now negative. I do not believe she has lupus as a cause for joint pain or urticaria. Change from positive to negative ANA without significant immunosuppression is not typical and may represent a more reactive or transient problem. Also a significant minority of chronic urticaria patients can cause this finding.  Osteoporosis screening  I recommend she go for osteoporosis screening she has risk factors including cigarette use and intermittent steroid treatments.  Orders: No orders of the defined types were placed in this encounter.  No orders of the defined types were placed in this encounter.    Follow-Up Instructions: No follow-ups on file.   Collier Salina, MD  Note - This record has been created using [employer].  Chart creation errors have been sought, but may not always  have been located. Such creation errors do not reflect on  the  standard of medical care.

## 2021-05-10 ENCOUNTER — Other Ambulatory Visit: Payer: Self-pay

## 2021-05-10 ENCOUNTER — Ambulatory Visit (INDEPENDENT_AMBULATORY_CARE_PROVIDER_SITE_OTHER): Payer: Medicaid Other | Admitting: Internal Medicine

## 2021-05-10 ENCOUNTER — Encounter: Payer: Self-pay | Admitting: Internal Medicine

## 2021-05-10 VITALS — BP 121/82 | HR 91 | Ht 60.0 in | Wt 238.4 lb

## 2021-05-10 DIAGNOSIS — R768 Other specified abnormal immunological findings in serum: Secondary | ICD-10-CM | POA: Diagnosis not present

## 2021-05-10 DIAGNOSIS — G8929 Other chronic pain: Secondary | ICD-10-CM

## 2021-05-10 DIAGNOSIS — Z13820 Encounter for screening for osteoporosis: Secondary | ICD-10-CM | POA: Diagnosis not present

## 2021-05-10 DIAGNOSIS — M159 Polyosteoarthritis, unspecified: Secondary | ICD-10-CM

## 2021-05-10 DIAGNOSIS — M79645 Pain in left finger(s): Secondary | ICD-10-CM | POA: Diagnosis not present

## 2021-05-10 DIAGNOSIS — L508 Other urticaria: Secondary | ICD-10-CM

## 2021-05-10 DIAGNOSIS — M25562 Pain in left knee: Secondary | ICD-10-CM | POA: Diagnosis not present

## 2021-05-10 DIAGNOSIS — M79644 Pain in right finger(s): Secondary | ICD-10-CM

## 2021-05-10 DIAGNOSIS — M25561 Pain in right knee: Secondary | ICD-10-CM | POA: Diagnosis not present

## 2021-05-10 NOTE — Patient Instructions (Addendum)
I don't see evidence for lupus as a cause of your joint pains and rashes and don't recommend new treatment for this.  I think you may benefit to see an orthopedic specialist for your osteoarthritis in multiple areas. There has been some worsening in left knee disease over time.  You have a few risk factors for osteoporosis which is low bone density such as age, sex, tobacco use, and steroid use. Screening at least once is recommended for women aged 56-65 who have risk factors.   For osteoarthritis several treatments may be beneficial: - Topical antiinflammatory medicine such as diclofenac or Voltaren can be applied to  affected area as needed but may be less effective than oral antiinflammatory medicine. Topical analgesics containing CBD, menthol, or lidocaine can be tried.  - Oral nonsteroidal antiinflammatory medication such as ibuprofen, aleve, celebrex, or mobic are very helpful for osteoarthritis but can cause side effects such as stomach ulcers or hypertension with prolonged use. These should be taken intermittently or as needed, and always taken with food.  - Other oral supplements such as glucosamine chondroitin containing OTC treatments do not have strong data supporting effectiveness but can be helpful for some individuals and have no major side effects. Turmeric has some antiinflammatory effect similar to NSAID medications and may help, if taken as a supplement should not be taken above recommended doses.  - Compressive gloves can be helpful to support the thumb joint especially if hurting with certain activities.  - Occupational therapy referral can discuss exercises or activity modification to improve symptoms or strength if needed.  - Local steroid injection is an option if symptoms become worse and not controlled by the above options.

## 2021-05-16 ENCOUNTER — Ambulatory Visit: Payer: Medicaid Other | Admitting: Family Medicine

## 2021-05-17 ENCOUNTER — Encounter: Payer: Self-pay | Admitting: Family Medicine

## 2021-05-17 ENCOUNTER — Other Ambulatory Visit: Payer: Self-pay

## 2021-05-17 ENCOUNTER — Ambulatory Visit (INDEPENDENT_AMBULATORY_CARE_PROVIDER_SITE_OTHER): Payer: Medicaid Other | Admitting: Family Medicine

## 2021-05-17 VITALS — BP 111/75 | HR 100 | Temp 98.8°F | Wt 232.6 lb

## 2021-05-17 DIAGNOSIS — Z13820 Encounter for screening for osteoporosis: Secondary | ICD-10-CM | POA: Diagnosis not present

## 2021-05-17 DIAGNOSIS — M159 Polyosteoarthritis, unspecified: Secondary | ICD-10-CM

## 2021-05-17 DIAGNOSIS — L508 Other urticaria: Secondary | ICD-10-CM | POA: Diagnosis not present

## 2021-05-17 DIAGNOSIS — Z1231 Encounter for screening mammogram for malignant neoplasm of breast: Secondary | ICD-10-CM | POA: Diagnosis not present

## 2021-05-17 DIAGNOSIS — M150 Primary generalized (osteo)arthritis: Secondary | ICD-10-CM

## 2021-05-17 MED ORDER — DICLOFENAC SODIUM 1 % EX GEL: 4.0000 g | Freq: Four times a day (QID) | CUTANEOUS | 3 refills | Status: AC

## 2021-05-17 MED ORDER — GABAPENTIN 100 MG PO CAPS
200.0000 mg | ORAL_CAPSULE | Freq: Three times a day (TID) | ORAL | 0 refills | Status: DC
Start: 2021-05-17 — End: 2021-10-04

## 2021-05-17 MED ORDER — TRIAMCINOLONE ACETONIDE 40 MG/ML IJ SUSP
40.0000 mg | Freq: Once | INTRAMUSCULAR | Status: AC
  Administered 2021-05-17: 40 mg via INTRAMUSCULAR

## 2021-05-17 NOTE — Assessment & Plan Note (Signed)
Would like to see ortho. Referral generated today.

## 2021-05-17 NOTE — Assessment & Plan Note (Addendum)
Will call allergist to see if she can get in. Doing better with her meds, but having a flare. Triamcinalone shot today. Call with any concerns.

## 2021-05-17 NOTE — Progress Notes (Signed)
BP 111/75    Pulse 100    Temp 98.8 F (37.1 C)    Wt 232 lb 9.6 oz (105.5 kg)    LMP 08/02/2017 (Exact Date)    SpO2 96%    BMI 45.43 kg/m    Subjective:    Patient ID: Nancy Stout, female    DOB: [birth year], 56 y.o.   MRN: [id number]  HPI: Nancy Stout is a 56 y.o. female  Chief Complaint  Patient presents with   Urticaria    Patient is currently breaking out in hives that she noticed about 2 weeks ago. Patient has not heard from allergy, gave patient phone number to contact them.    rheumatology    Patient states her rheumatologist recommended she see ortho and have a dexa scan    Saw rheumatology 04/26/21 and 05/10/21 for +ANA. They checked additional labs an x-rays of her hands.  They feel like she has osteoarthritis rather than any rheumatologic issues. Joints continue to hurt, especially her knees and her hands. Has only been using naproxen. She feels stiff in the AM and notes that they ache.  RASH- was doing well up until about 2 weeks ago. Much better than she had been  Duration:  chronic  Location: generalized  Itching: yes Burning: yes Redness: yes Oozing: no Scaling: yes Blisters: no Painful: no Fevers: no Change in detergents/soaps/personal care products: no Recent illness: no Recent travel:no History of same: yes Context: better Alleviating factors: medications Treatments attempted:pepcid, zyrtec, singulair Shortness of breath: no  Throat/tongue swelling: no Myalgias/arthralgias: no   Relevant past medical, surgical, family and social history reviewed and updated as indicated. Interim medical history since our last visit reviewed. Allergies and medications reviewed and updated.  Review of Systems  Constitutional: Negative.   Respiratory: Negative.    Cardiovascular: Negative.   Gastrointestinal: Negative.   Musculoskeletal:  Positive for arthralgias and myalgias. Negative for back pain, gait problem, joint swelling, neck pain and neck  stiffness.  Skin:  Positive for rash. Negative for color change, pallor and wound.  Neurological: Negative.   Psychiatric/Behavioral: Negative.     Per HPI unless specifically indicated above     Objective:    BP 111/75    Pulse 100    Temp 98.8 F (37.1 C)    Wt 232 lb 9.6 oz (105.5 kg)    LMP 08/02/2017 (Exact Date)    SpO2 96%    BMI 45.43 kg/m   Wt Readings from Last 3 Encounters:  05/17/21 232 lb 9.6 oz (105.5 kg)  05/10/21 238 lb 6.4 oz (108.1 kg)  04/26/21 242 lb 3.2 oz (109.9 kg)    Physical Exam Vitals and nursing note reviewed.  Constitutional:      General: She is not in acute distress.    Appearance: Normal appearance. She is not ill-appearing, toxic-appearing or diaphoretic.  HENT:     Head: Normocephalic and atraumatic.     Right Ear: External ear normal.     Left Ear: External ear normal.     Nose: Nose normal.     Mouth/Throat:     Mouth: Mucous membranes are moist.     Pharynx: Oropharynx is clear.  Eyes:     General: No scleral icterus.       Right eye: No discharge.        Left eye: No discharge.     Extraocular Movements: Extraocular movements intact.     Conjunctiva/sclera: Conjunctivae normal.     Pupils: Pupils  are equal, round, and reactive to light.  Cardiovascular:     Rate and Rhythm: Normal rate and regular rhythm.     Pulses: Normal pulses.     Heart sounds: Normal heart sounds. No murmur heard.   No friction rub. No gallop.  Pulmonary:     Effort: Pulmonary effort is normal. No respiratory distress.     Breath sounds: No stridor. Wheezing present. No rhonchi or rales.  Chest:     Chest wall: No tenderness.  Musculoskeletal:        General: Normal range of motion.     Cervical back: Normal range of motion and neck supple.  Skin:    General: Skin is warm and dry.     Capillary Refill: Capillary refill takes less than 2 seconds.     Coloration: Skin is not jaundiced or pale.     Findings: Rash present. No bruising, erythema or lesion.   Neurological:     General: No focal deficit present.     Mental Status: She is alert and oriented to person, place, and time. Mental status is at baseline.  Psychiatric:        Mood and Affect: Mood normal.        Behavior: Behavior normal.        Thought Content: Thought content normal.        Judgment: Judgment normal.    Results for orders placed or performed in visit on 04/26/21  Anti-DNA antibody, double-stranded  Result Value Ref Range   ds DNA Ab 11 (H) IU/mL  ANA  Result Value Ref Range   Anti Nuclear Antibody (ANA) NEGATIVE NEGATIVE  C3 and C4  Result Value Ref Range   C3 Complement 129 83 - 193 mg/dL   C4 Complement 26 15 - 57 mg/dL      Assessment & Plan:   Problem List Items Addressed This Visit       Musculoskeletal and Integument   Osteoarthritis - Primary    Would like to see ortho. Referral generated today.      Relevant Medications   triamcinolone acetonide (KENALOG-40) injection 40 mg   Other Relevant Orders   Ambulatory referral to Orthopedic Surgery   Chronic urticaria    Will call allergist to see if she can get in. Doing better with her meds, but having a flare. Triamcinalone shot today. Call with any concerns.       Relevant Medications   triamcinolone acetonide (KENALOG-40) injection 40 mg   Other Visit Diagnoses     Screening for osteoporosis       DEXA ordered today.   Relevant Orders   DG Bone Density   Encounter for screening mammogram for malignant neoplasm of breast       Mammo ordered today.   Relevant Orders   MM 3D SCREEN BREAST BILATERAL        Follow up plan: Return As able PAP.

## 2021-05-17 NOTE — Patient Instructions (Signed)
Please call to schedule your mammogram and bone density: °[facility]  °Address: [address], [place], [place] [address]  °Phone: [phone] ° °

## 2021-06-11 ENCOUNTER — Telehealth: Payer: Self-pay

## 2021-06-11 NOTE — Telephone Encounter (Signed)
Patient states she has an appointment with Dr. Gerilyn Pilgrim an orthopedic doctor in [place] on Friday, 06/14/21 at 10:00 am.  Patient states she was told to bring a copy of her x-rays (knees and hands) that were taken at her appointment with Dr. Dimple Casey.  Patient requested a return call when the CD is ready to be picked up.   ?

## 2021-06-11 NOTE — Telephone Encounter (Signed)
I called patient, CD at front desk for patient to pick up. ?

## 2021-06-14 DIAGNOSIS — M170 Bilateral primary osteoarthritis of knee: Secondary | ICD-10-CM | POA: Diagnosis not present

## 2021-06-14 DIAGNOSIS — G5603 Carpal tunnel syndrome, bilateral upper limbs: Secondary | ICD-10-CM | POA: Diagnosis not present

## 2021-06-23 ENCOUNTER — Other Ambulatory Visit: Payer: Self-pay | Admitting: Family Medicine

## 2021-06-25 NOTE — Telephone Encounter (Signed)
Requested medications are due for refill today.  unsure ? ?Requested medications are on the active medications list.  yes ? ?Last refill. 05/17/2021 #360 0 refills ? ?Future visit scheduled.   no ? ?Notes to clinic.  Prescription written to expire 06/16/2021 - Rx expired. ? ? ? ?Requested Prescriptions  ?Pending Prescriptions Disp Refills  ? gabapentin (NEURONTIN) 100 MG capsule [Pharmacy Med Name: GABAPENTIN 100 MG CAPSULE] 360 capsule 0  ?  Sig: Take 2-4 capsules (200-400 mg total) by mouth 3 (three) times daily.  ?  ? Neurology: Anticonvulsants - gabapentin Failed - 06/23/2021  9:31 AM  ?  ?  Failed - Cr in normal range and within 360 days  ?  Creatinine, Ser  ?Date Value Ref Range Status  ?02/25/2021 0.53 (L) 0.57 - 1.00 mg/dL Final  ?  ?  ?  ?  Passed - Completed PHQ-2 or PHQ-9 in the last 360 days  ?  ?  Passed - Valid encounter within last 12 months  ?  Recent Outpatient Visits   ? ?      ? 1 month ago Primary osteoarthritis involving multiple joints  ? [place] P, DO  ? 4 months ago Mixed hyperlipidemia  ? [place], Megan P, DO  ? 4 months ago Hives  ? [place], NP  ? 5 months ago Hives  ? [place], NP  ? 9 months ago Rash due to allergy  ? [facility] Vigg, Avanti, MD  ? ?  ?  ? ?  ?  ?  ?  ?

## 2021-06-27 ENCOUNTER — Other Ambulatory Visit: Payer: Self-pay | Admitting: Family Medicine

## 2021-06-27 DIAGNOSIS — G5603 Carpal tunnel syndrome, bilateral upper limbs: Secondary | ICD-10-CM | POA: Diagnosis not present

## 2021-06-28 NOTE — Telephone Encounter (Signed)
Requested medication (s) are due for refill today:   No ? ?Requested medication (s) are on the active medication list:   No ? ?Future visit scheduled:   No   Seen a mo. ago ? ? ?Last ordered: Discontinued 05/17/2021. ? ?Returned because got a refill request  ? ?Requested Prescriptions  ?Pending Prescriptions Disp Refills  ? buPROPion (WELLBUTRIN SR) 150 MG 12 hr tablet [Pharmacy Med Name: BUPROPION HCL SR 150 MG TABLET] 180 tablet 0  ?  Sig: TAKE 1 TAB IN THE AM FOR 1 WEEK, THEN INCREASE TO 1 TAB TWICE A DAY , PICK A DAY IN THE 2ND WEEK TO TRY TO QUIT  ?  ? Psychiatry: Antidepressants - bupropion Failed - 06/27/2021  1:49 AM  ?  ?  Failed - Cr in normal range and within 360 days  ?  Creatinine, Ser  ?Date Value Ref Range Status  ?02/25/2021 0.53 (L) 0.57 - 1.00 mg/dL Final  ?  ?  ?  ?  Passed - AST in normal range and within 360 days  ?  AST  ?Date Value Ref Range Status  ?02/25/2021 33 0 - 40 IU/L Final  ?  ?  ?  ?  Passed - ALT in normal range and within 360 days  ?  ALT  ?Date Value Ref Range Status  ?02/25/2021 32 0 - 32 IU/L Final  ?  ?  ?  ?  Passed - Last BP in normal range  ?  BP Readings from Last 1 Encounters:  ?05/17/21 111/75  ?  ?  ?  ?  Passed - Valid encounter within last 6 months  ?  Recent Outpatient Visits   ? ?      ? 1 month ago Primary osteoarthritis involving multiple joints  ? [facility] [place], Megan P, DO  ? 4 months ago Mixed hyperlipidemia  ? [facility] [place], Megan P, DO  ? 4 months ago Hives  ? [facility] Larae Grooms, NP  ? 5 months ago Hives  ? [facility] Larae Grooms, NP  ? 9 months ago Rash due to allergy  ? [facility] Vigg, Avanti, MD  ? ?  ?  ? ?  ?  ?  ? ?

## 2021-07-09 ENCOUNTER — Other Ambulatory Visit: Payer: Self-pay | Admitting: Family Medicine

## 2021-07-10 NOTE — Telephone Encounter (Signed)
Requested Prescriptions  ?Pending Prescriptions Disp Refills  ?? VENTOLIN HFA 108 (90 Base) MCG/ACT inhaler [Pharmacy Med Name: VENTOLIN HFA 90 MCG INHALER] 18 each   ?  Sig: INHALE 2 PUFFS BY MOUTH EVERY 4 HOURS AS NEEDED  ?  ? Pulmonology:  Beta Agonists 2 Passed - 07/09/2021  1:11 PM  ?  ?  Passed - Last BP in normal range  ?  BP Readings from Last 1 Encounters:  ?05/17/21 111/75  ?   ?  ?  Passed - Last Heart Rate in normal range  ?  Pulse Readings from Last 1 Encounters:  ?05/17/21 100  ?   ?  ?  Passed - Valid encounter within last 12 months  ?  Recent Outpatient Visits   ?      ? 1 month ago Primary osteoarthritis involving multiple joints  ? [facility] [place], Megan P, DO  ? 4 months ago Mixed hyperlipidemia  ? [facility] [place], Megan P, DO  ? 5 months ago Hives  ? [facility] Larae Grooms, NP  ? 6 months ago Hives  ? [facility] Larae Grooms, NP  ? 10 months ago Rash due to allergy  ? [facility] Vigg, Avanti, MD  ?  ?  ? ?  ?  ?  ? ?

## 2021-07-23 ENCOUNTER — Other Ambulatory Visit: Payer: Medicaid Other

## 2021-08-14 DIAGNOSIS — M180 Bilateral primary osteoarthritis of first carpometacarpal joints: Secondary | ICD-10-CM | POA: Diagnosis not present

## 2021-08-17 ENCOUNTER — Other Ambulatory Visit: Payer: Self-pay | Admitting: Family Medicine

## 2021-08-19 NOTE — Telephone Encounter (Signed)
Requested Prescriptions  ?Pending Prescriptions Disp Refills  ?? omeprazole (PRILOSEC) 20 MG capsule [Pharmacy Med Name: OMEPRAZOLE DR 20 MG CAPSULE] 90 capsule 1  ?  Sig: TAKE 1 CAPSULE BY MOUTH EVERY DAY  ?  ? Gastroenterology: Proton Pump Inhibitors Passed - 08/17/2021  9:22 AM  ?  ?  Passed - Valid encounter within last 12 months  ?  Recent Outpatient Visits   ?      ? 3 months ago Primary osteoarthritis involving multiple joints  ? [place], DO  ? 5 months ago Mixed hyperlipidemia  ? [place], Megan P, DO  ? 6 months ago Hives  ? [place], NP  ? 7 months ago Hives  ? [place], NP  ? 11 months ago Rash due to allergy  ? [facility] Vigg, Avanti, MD  ?  ?  ? ?  ?  ?  ? ? ?

## 2021-08-23 ENCOUNTER — Other Ambulatory Visit: Payer: Self-pay | Admitting: Family Medicine

## 2021-08-23 NOTE — Telephone Encounter (Signed)
called [facility] and there are refills left  Requested Prescriptions  Refused Prescriptions Disp Refills  . VENTOLIN HFA 108 (90 Base) MCG/ACT inhaler [Pharmacy Med Name: VENTOLIN HFA 90 MCG INHALER] 18 each 6    Sig: TAKE 2 PUFFS BY MOUTH EVERY 6 HOURS AS NEEDED FOR WHEEZE OR SHORTNESS OF BREATH     Pulmonology:  Beta Agonists 2 Passed - 08/23/2021  2:04 AM      Passed - Last BP in normal range    BP Readings from Last 1 Encounters:  05/17/21 111/75         Passed - Last Heart Rate in normal range    Pulse Readings from Last 1 Encounters:  05/17/21 100         Passed - Valid encounter within last 12 months    Recent Outpatient Visits          3 months ago Primary osteoarthritis involving multiple joints   [facility] [place], [place], DO   5 months ago Mixed hyperlipidemia   [facility] [place], [place], DO   6 months ago Hives   [facility] Larae Grooms, NP   7 months ago Hives   [facility] [place], Clydie Braun, NP   11 months ago Rash due to allergy   [facility] Loura Pardon, MD

## 2021-08-28 DIAGNOSIS — M1712 Unilateral primary osteoarthritis, left knee: Secondary | ICD-10-CM | POA: Diagnosis not present

## 2021-08-28 DIAGNOSIS — G5603 Carpal tunnel syndrome, bilateral upper limbs: Secondary | ICD-10-CM | POA: Diagnosis not present

## 2021-08-28 DIAGNOSIS — M5416 Radiculopathy, lumbar region: Secondary | ICD-10-CM | POA: Diagnosis not present

## 2021-09-14 ENCOUNTER — Other Ambulatory Visit: Payer: Self-pay | Admitting: Family Medicine

## 2021-09-16 DIAGNOSIS — M1712 Unilateral primary osteoarthritis, left knee: Secondary | ICD-10-CM | POA: Diagnosis not present

## 2021-09-16 NOTE — Telephone Encounter (Signed)
Requested Prescriptions  Pending Prescriptions Disp Refills  . montelukast (SINGULAIR) 10 MG tablet [Pharmacy Med Name: MONTELUKAST SOD 10 MG TABLET] 90 tablet 1    Sig: TAKE 1 TABLET BY MOUTH EVERYDAY AT BEDTIME     Pulmonology:  Leukotriene Inhibitors Passed - 09/14/2021  9:41 AM      Passed - Valid encounter within last 12 months    Recent Outpatient Visits          4 months ago Primary osteoarthritis involving multiple joints   [place], [place], DO   6 months ago Mixed hyperlipidemia   [place], [place], DO   7 months ago [place], NP   8 months ago [place], NP   1 year ago Rash due to allergy   [facility] Charlynne Cousins, MD

## 2021-09-24 ENCOUNTER — Ambulatory Visit: Payer: Self-pay | Admitting: *Deleted

## 2021-09-24 NOTE — Telephone Encounter (Signed)
Reason for Disposition  [1] Very swollen ankle AND [2] no fever  Answer Assessment - Initial Assessment Questions 1. LOCATION: "Which ankle is swollen?" "Where is the swelling?"     Both- R is worse, feet and ankles 2. ONSET: "When did the swelling start?"     2 weeks 3. SIZE: "How large is the swelling?"     Tight in shoes 4. PAIN: "Is there any pain?" If Yes, ask: "How bad is it?" (Scale 1-10; or mild, moderate, severe)   - NONE (0): no pain.   - MILD (1-3): doesn't interfere with normal activities.    - MODERATE (4-7): interferes with normal activities (e.g., work or school) or awakens from sleep, limping.    - SEVERE (8-10): excruciating pain, unable to do any normal activities, unable to walk.      No pain- skin tight 5. CAUSE: "What do you think caused the ankle swelling?"     Not sure 6. OTHER SYMPTOMS: "Do you have any other symptoms?" (e.g., fever, chest pain, difficulty breathing, calf pain)     no 7. PREGNANCY: "Is there any chance you are pregnant?" "When was your last menstrual period?"  Protocols used: Ankle Swelling-A-AH

## 2021-09-24 NOTE — Telephone Encounter (Signed)
  Chief Complaint: bilateral feet/ankle swelling Symptoms: swelling in feet and ankles Frequency: 2 weeks Pertinent Negatives: Patient denies fever, chest pain, difficulty breathing, calf pain Disposition: [] ED /[] Urgent Care (no appt availability in office) / [x] Appointment(In office/virtual)/ []  [place]/ [] Home Care/ [] Refused Recommended Disposition /[] [place] [facility]/ []  Follow-up with PCP Additional Notes: Offered appointment today- ut patient declined- scheduled for tomorrow

## 2021-09-25 ENCOUNTER — Ambulatory Visit
Admission: RE | Admit: 2021-09-25 | Discharge: 2021-09-25 | Disposition: A | Discharge status: Home / Self Care | Payer: Medicaid Other | Source: Ambulatory Visit | Attending: Internal Medicine | Admitting: Internal Medicine

## 2021-09-25 ENCOUNTER — Ambulatory Visit (INDEPENDENT_AMBULATORY_CARE_PROVIDER_SITE_OTHER): Payer: Medicaid Other | Admitting: Internal Medicine

## 2021-09-25 ENCOUNTER — Ambulatory Visit
Admission: RE | Admit: 2021-09-25 | Discharge: 2021-09-25 | Disposition: A | Discharge status: Home / Self Care | Payer: Medicaid Other | Source: Home / Self Care | Attending: Internal Medicine | Admitting: Internal Medicine

## 2021-09-25 ENCOUNTER — Encounter: Payer: Self-pay | Admitting: Internal Medicine

## 2021-09-25 ENCOUNTER — Ambulatory Visit
Admission: RE | Admit: 2021-09-25 | Discharge: 2021-09-25 | Disposition: A | Discharge status: Home / Self Care | Payer: Medicaid Other | Source: Ambulatory Visit | Attending: Family Medicine | Admitting: Family Medicine

## 2021-09-25 VITALS — BP 133/78 | HR 91 | Temp 98.4°F | Ht 60.0 in | Wt 245.8 lb

## 2021-09-25 DIAGNOSIS — Z13820 Encounter for screening for osteoporosis: Secondary | ICD-10-CM | POA: Insufficient documentation

## 2021-09-25 DIAGNOSIS — R0602 Shortness of breath: Secondary | ICD-10-CM | POA: Insufficient documentation

## 2021-09-25 DIAGNOSIS — Z1231 Encounter for screening mammogram for malignant neoplasm of breast: Secondary | ICD-10-CM

## 2021-09-25 DIAGNOSIS — R609 Edema, unspecified: Secondary | ICD-10-CM | POA: Insufficient documentation

## 2021-09-25 DIAGNOSIS — M8589 Other specified disorders of bone density and structure, multiple sites: Secondary | ICD-10-CM | POA: Diagnosis not present

## 2021-09-25 DIAGNOSIS — Z780 Asymptomatic menopausal state: Secondary | ICD-10-CM | POA: Diagnosis not present

## 2021-09-25 DIAGNOSIS — J9811 Atelectasis: Secondary | ICD-10-CM | POA: Diagnosis not present

## 2021-09-25 DIAGNOSIS — R600 Localized edema: Secondary | ICD-10-CM | POA: Insufficient documentation

## 2021-09-25 LAB — URINALYSIS, ROUTINE W REFLEX MICROSCOPIC
Bilirubin, UA: NEGATIVE
Glucose, UA: NEGATIVE
Ketones, UA: NEGATIVE
Leukocytes,UA: NEGATIVE
Nitrite, UA: NEGATIVE
Protein,UA: NEGATIVE
RBC, UA: NEGATIVE
Specific Gravity, UA: 1.02 (ref 1.005–1.030)
Urobilinogen, Ur: 1 mg/dL (ref 0.2–1.0)
pH, UA: 7 (ref 5.0–7.5)

## 2021-09-25 MED ORDER — FUROSEMIDE 20 MG PO TABS: 20.0000 mg | ORAL_TABLET | Freq: Every day | ORAL | 0 refills | Status: DC

## 2021-09-25 NOTE — Progress Notes (Signed)
BP 133/78   Pulse 91   Temp 98.4 F (36.9 C) (Oral)   Ht 5' (1.524 m)   Wt 245 lb 12.8 oz (111.5 kg)   LMP 08/02/2017 (Exact Date)   SpO2 98%   BMI 48.00 kg/m    Subjective:    Patient ID: Nancy Stout, female    DOB: 07-04-1965, 56 y.o.   MRN: [id number]  Chief Complaint  Patient presents with  . Edema    B/L foot and ankle swelling for 2 weeks.    HPI: Nancy Stout is a 56 y.o. female  Pt says she has lower extremity swelling bilaterally on ankle and feet. Denies SOB apart form baseline with her asthma ,  Her lower extremity swelling started after restarting meloxicam  x 2 weeks ago   Chief Complaint  Patient presents with  . Edema    B/L foot and ankle swelling for 2 weeks.    Relevant past medical, surgical, family and social history reviewed and updated as indicated. Interim medical history since our last visit reviewed. Allergies and medications reviewed and updated.  Review of Systems Per HPI unless specifically indicated above     Objective:    BP 133/78   Pulse 91   Temp 98.4 F (36.9 C) (Oral)   Ht 5' (1.524 m)   Wt 245 lb 12.8 oz (111.5 kg)   LMP 08/02/2017 (Exact Date)   SpO2 98%   BMI 48.00 kg/m   Wt Readings from Last 3 Encounters:  09/25/21 245 lb 12.8 oz (111.5 kg)  05/17/21 232 lb 9.6 oz (105.5 kg)  05/10/21 238 lb 6.4 oz (108.1 kg)    Physical Exam Vitals and nursing note reviewed.  Constitutional:      General: She is not in acute distress.    Appearance: Normal appearance. She is not ill-appearing or diaphoretic.  Eyes:     Conjunctiva/sclera: Conjunctivae normal.  Pulmonary:     Breath sounds: No rhonchi.  Abdominal:     General: Abdomen is flat. Bowel sounds are normal. There is no distension.     Palpations: Abdomen is soft. There is no mass.     Tenderness: There is no abdominal tenderness. There is no guarding. Right CVA tenderness: n. Skin:    General: Skin is warm and dry.     Coloration: Skin is not  jaundiced.     Findings: No erythema.  Neurological:     Mental Status: She is alert.    Results for orders placed or performed in visit on 09/25/21  Comprehensive metabolic panel  Result Value Ref Range   Glucose 122 (H) 70 - 99 mg/dL   BUN 11 6 - 24 mg/dL   Creatinine, Ser 0.66 0.57 - 1.00 mg/dL   eGFR 104 >59 mL/min/1.73   BUN/Creatinine Ratio 17 9 - 23   Sodium 144 134 - 144 mmol/L   Potassium 4.2 3.5 - 5.2 mmol/L   Chloride 104 96 - 106 mmol/L   CO2 27 20 - 29 mmol/L   Calcium 9.6 8.7 - 10.2 mg/dL   Total Protein 6.9 6.0 - 8.5 g/dL   Albumin 4.3 3.8 - 4.9 g/dL   Globulin, Total 2.6 1.5 - 4.5 g/dL   Albumin/Globulin Ratio 1.7 1.2 - 2.2   Bilirubin Total 0.4 0.0 - 1.2 mg/dL   Alkaline Phosphatase 113 44 - 121 IU/L   AST 17 0 - 40 IU/L   ALT 21 0 - 32 IU/L  CBC with Differential/Platelet  Result Value  Ref Range   WBC 7.9 3.4 - 10.8 x10E3/uL   RBC 4.79 3.77 - 5.28 x10E6/uL   Hemoglobin 16.2 (H) 11.1 - 15.9 g/dL   Hematocrit 46.8 (H) 34.0 - 46.6 %   MCV 98 (H) 79 - 97 fL   MCH 33.8 (H) 26.6 - 33.0 pg   MCHC 34.6 31.5 - 35.7 g/dL   RDW 13.1 11.7 - 15.4 %   Platelets 283 150 - 450 x10E3/uL   Neutrophils 67 Not Estab. %   Lymphs 21 Not Estab. %   Monocytes 8 Not Estab. %   Eos 3 Not Estab. %   Basos 1 Not Estab. %   Neutrophils Absolute 5.4 1.4 - 7.0 x10E3/uL   Lymphocytes Absolute 1.7 0.7 - 3.1 x10E3/uL   Monocytes Absolute 0.6 0.1 - 0.9 x10E3/uL   EOS (ABSOLUTE) 0.2 0.0 - 0.4 x10E3/uL   Basophils Absolute 0.1 0.0 - 0.2 x10E3/uL   Immature Granulocytes 0 Not Estab. %   Immature Grans (Abs) 0.0 0.0 - 0.1 x10E3/uL  Urinalysis, Routine w reflex microscopic  Result Value Ref Range   Specific Gravity, UA 1.020 1.005 - 1.030   pH, UA 7.0 5.0 - 7.5   Color, UA Yellow Yellow   Appearance Ur Clear Clear   Leukocytes,UA Negative Negative   Protein,UA Negative Negative/Trace   Glucose, UA Negative Negative   Ketones, UA Negative Negative   RBC, UA Negative Negative    Bilirubin, UA Negative Negative   Urobilinogen, Ur 1.0 0.2 - 1.0 mg/dL   Nitrite, UA Negative Negative        Current Outpatient Medications:  .  albuterol (PROVENTIL) (2.5 MG/3ML) 0.083% nebulizer solution, Take 3 mLs (2.5 mg total) by nebulization every 4 (four) hours as needed for wheezing or shortness of breath., Disp: 450 mL, Rfl: 6 .  budesonide-formoterol (SYMBICORT) 160-4.5 MCG/ACT inhaler, Inhale 2 puffs into the lungs 2 (two) times daily., Disp: 1 each, Rfl: 12 .  cetirizine (ZYRTEC) 10 MG tablet, Take 1 tablet (10 mg total) by mouth daily., Disp: 90 tablet, Rfl: 4 .  diclofenac Sodium (VOLTAREN) 1 % GEL, Apply 4 g topically 4 (four) times daily., Disp: 350 g, Rfl: 3 .  famotidine (PEPCID) 20 MG tablet, Take 1 tablet (20 mg total) by mouth 2 (two) times daily., Disp: 180 tablet, Rfl: 1 .  fluticasone (FLONASE) 50 MCG/ACT nasal spray, SPRAY 2 SPRAYS INTO EACH NOSTRIL EVERY DAY, Disp: 16 mL, Rfl: 6 .  furosemide (LASIX) 20 MG tablet, Take 1 tablet (20 mg total) by mouth daily., Disp: 30 tablet, Rfl: 0 .  gabapentin (NEURONTIN) 400 MG capsule, Take 1 capsule (400 mg total) by mouth 3 (three) times daily., Disp: 270 capsule, Rfl: 1 .  Homeopathic Products (SIMILASAN STYE EYE RELIEF OP), Apply to eye as needed., Disp: , Rfl:  .  lisinopril (ZESTRIL) 20 MG tablet, Take 1 tablet (20 mg total) by mouth daily., Disp: 90 tablet, Rfl: 1 .  montelukast (SINGULAIR) 10 MG tablet, TAKE 1 TABLET BY MOUTH EVERYDAY AT BEDTIME, Disp: 90 tablet, Rfl: 1 .  Multiple Vitamin (MULTIVITAMIN) tablet, Take 1 tablet by mouth daily., Disp: , Rfl:  .  nicotine (NICODERM CQ - DOSED IN MG/24 HOURS) 21 mg/24hr patch, PLACE 1 PATCH ONTO THE SKIN DAILY., Disp: 28 patch, Rfl: 2 .  omeprazole (PRILOSEC) 20 MG capsule, TAKE 1 CAPSULE BY MOUTH EVERY DAY, Disp: 90 capsule, Rfl: 1 .  pravastatin (PRAVACHOL) 80 MG tablet, Take 1 tablet (80 mg total) by mouth daily.,  Disp: 90 tablet, Rfl: 1 .  VENTOLIN HFA 108 (90 Base)  MCG/ACT inhaler, INHALE 2 PUFFS BY MOUTH EVERY 4 HOURS AS NEEDED, Disp: 18 each, Rfl: 3 .  gabapentin (NEURONTIN) 100 MG capsule, Take 2-4 capsules (200-400 mg total) by mouth 3 (three) times daily., Disp: 360 capsule, Rfl: 0 .  methylPREDNISolone (MEDROL DOSEPAK) 4 MG TBPK tablet, Medrol (Pak) 4 mg tablets in a dose pack  Take 1 dose pk by oral route., Disp: , Rfl:  .  naproxen (NAPROSYN) 500 MG tablet, Take 1 tablet (500 mg total) by mouth 2 (two) times daily with a meal. (Patient not taking: Reported on 09/25/2021), Disp: 180 tablet, Rfl: 1    Assessment & Plan:  Lower extremity swelling : ? Sec to meloxicam  Stop meloxicam Check labs  Consider CXR / Wu for other etiologies if swelling doesn't get better.  ICE and Elevaiton helps continue doing this.   Problem List Items Addressed This Visit       Other   Swelling - Primary   Relevant Orders   Comprehensive metabolic panel (Completed)   CBC with Differential/Platelet (Completed)   Urinalysis, Routine w reflex microscopic (Completed)   DG Chest 2 View (Completed)   SOB (shortness of breath)   Relevant Orders   DG Chest 2 View (Completed)     Orders Placed This Encounter  Procedures  . DG Chest 2 View  . Comprehensive metabolic panel  . CBC with Differential/Platelet  . Urinalysis, Routine w reflex microscopic     Meds ordered this encounter  Medications  . furosemide (LASIX) 20 MG tablet    Sig: Take 1 tablet (20 mg total) by mouth daily.    Dispense:  30 tablet    Refill:  0     Follow up plan: No follow-ups on file.

## 2021-09-26 ENCOUNTER — Other Ambulatory Visit: Payer: Self-pay | Admitting: Family Medicine

## 2021-09-26 DIAGNOSIS — R928 Other abnormal and inconclusive findings on diagnostic imaging of breast: Secondary | ICD-10-CM

## 2021-09-26 DIAGNOSIS — N6489 Other specified disorders of breast: Secondary | ICD-10-CM

## 2021-09-26 LAB — CBC WITH DIFFERENTIAL/PLATELET
Basophils Absolute: 0.1 10*3/uL (ref 0.0–0.2)
Basos: 1 %
EOS (ABSOLUTE): 0.2 10*3/uL (ref 0.0–0.4)
Eos: 3 %
Hematocrit: 46.8 % — ABNORMAL HIGH (ref 34.0–46.6)
Hemoglobin: 16.2 g/dL — ABNORMAL HIGH (ref 11.1–15.9)
Immature Grans (Abs): 0 10*3/uL (ref 0.0–0.1)
Immature Granulocytes: 0 %
Lymphocytes Absolute: 1.7 10*3/uL (ref 0.7–3.1)
Lymphs: 21 %
MCH: 33.8 pg — ABNORMAL HIGH (ref 26.6–33.0)
MCHC: 34.6 g/dL (ref 31.5–35.7)
MCV: 98 fL — ABNORMAL HIGH (ref 79–97)
Monocytes Absolute: 0.6 10*3/uL (ref 0.1–0.9)
Monocytes: 8 %
Neutrophils Absolute: 5.4 10*3/uL (ref 1.4–7.0)
Neutrophils: 67 %
Platelets: 283 10*3/uL (ref 150–450)
RBC: 4.79 x10E6/uL (ref 3.77–5.28)
RDW: 13.1 % (ref 11.7–15.4)
WBC: 7.9 10*3/uL (ref 3.4–10.8)

## 2021-09-26 LAB — COMPREHENSIVE METABOLIC PANEL
ALT: 21 IU/L (ref 0–32)
AST: 17 IU/L (ref 0–40)
Albumin/Globulin Ratio: 1.7 (ref 1.2–2.2)
Albumin: 4.3 g/dL (ref 3.8–4.9)
Alkaline Phosphatase: 113 IU/L (ref 44–121)
BUN/Creatinine Ratio: 17 (ref 9–23)
BUN: 11 mg/dL (ref 6–24)
Bilirubin Total: 0.4 mg/dL (ref 0.0–1.2)
CO2: 27 mmol/L (ref 20–29)
Calcium: 9.6 mg/dL (ref 8.7–10.2)
Chloride: 104 mmol/L (ref 96–106)
Creatinine, Ser: 0.66 mg/dL (ref 0.57–1.00)
Globulin, Total: 2.6 g/dL (ref 1.5–4.5)
Glucose: 122 mg/dL — ABNORMAL HIGH (ref 70–99)
Potassium: 4.2 mmol/L (ref 3.5–5.2)
Sodium: 144 mmol/L (ref 134–144)
Total Protein: 6.9 g/dL (ref 6.0–8.5)
eGFR: 104 mL/min/{1.73_m2} (ref >59)

## 2021-10-04 ENCOUNTER — Encounter: Payer: Self-pay | Admitting: Family Medicine

## 2021-10-04 ENCOUNTER — Ambulatory Visit (INDEPENDENT_AMBULATORY_CARE_PROVIDER_SITE_OTHER): Payer: Medicaid Other | Admitting: Family Medicine

## 2021-10-04 VITALS — BP 110/67 | HR 93 | Temp 98.5°F | Ht 60.0 in | Wt 242.0 lb

## 2021-10-04 DIAGNOSIS — R609 Edema, unspecified: Secondary | ICD-10-CM | POA: Diagnosis not present

## 2021-10-04 DIAGNOSIS — M25562 Pain in left knee: Secondary | ICD-10-CM

## 2021-10-04 DIAGNOSIS — G8929 Other chronic pain: Secondary | ICD-10-CM

## 2021-10-04 DIAGNOSIS — M25561 Pain in right knee: Secondary | ICD-10-CM

## 2021-10-04 MED ORDER — DICLOFENAC SODIUM 75 MG PO TBEC: 75.0000 mg | DELAYED_RELEASE_TABLET | Freq: Two times a day (BID) | ORAL | 2 refills | Status: DC

## 2021-10-04 NOTE — Assessment & Plan Note (Signed)
Will change from meloxicam which gives her a headache to voltaren. Recheck in about 2 months. Call with any concerns. Continue to monitor.

## 2021-10-04 NOTE — Assessment & Plan Note (Signed)
Resolved. Advised to avoid lasix due to low BP. Will stay off naproxen due to edema. Continue to monitor. Advised compression hose for travel.

## 2021-10-04 NOTE — Progress Notes (Signed)
BP 110/67   Pulse 93   Temp 98.5 F (36.9 C) (Oral)   Ht 5' (1.524 m)   Wt 242 lb (109.8 kg)   LMP 08/02/2017 (Exact Date)   SpO2 96%   BMI 47.26 kg/m    Subjective:    Patient ID: Johnnye Sima, female    DOB: 1965-09-02, 56 y.o.   MRN: [id number]  HPI: Vicci Reder is a 56 y.o. female  Chief Complaint  Patient presents with   Edema    B/L lower extremity/foot swelling, here for follow up, was given Meloxicam for such and she takes 1/2 pill and gives her an extreme headache and also can take naproxen because it causes her lower leg swelling/foot.    LE edema resolved when she stopped the naproxen. She has been taking meloxicam for her knee pain but its been giving her a bad headache when she takes it. Continues with bilateral knee pain. It's aching and sore. Pain does not radiate. She has not had any weakness. She is otherwise feeling well with no other concerns or complaints at this time.   Relevant past medical, surgical, family and social history reviewed and updated as indicated. Interim medical history since our last visit reviewed. Allergies and medications reviewed and updated.  Review of Systems  Constitutional: Negative.   Respiratory: Negative.    Cardiovascular: Negative.   Gastrointestinal: Negative.   Musculoskeletal:  Positive for arthralgias. Negative for back pain, gait problem, joint swelling, myalgias, neck pain and neck stiffness.  Skin: Negative.   Psychiatric/Behavioral: Negative.      Per HPI unless specifically indicated above     Objective:    BP 110/67   Pulse 93   Temp 98.5 F (36.9 C) (Oral)   Ht 5' (1.524 m)   Wt 242 lb (109.8 kg)   LMP 08/02/2017 (Exact Date)   SpO2 96%   BMI 47.26 kg/m   Wt Readings from Last 3 Encounters:  10/04/21 242 lb (109.8 kg)  09/25/21 245 lb 12.8 oz (111.5 kg)  05/17/21 232 lb 9.6 oz (105.5 kg)    Physical Exam Vitals and nursing note reviewed.  Constitutional:      General: She is not in  acute distress.    Appearance: Normal appearance. She is obese. She is not ill-appearing, toxic-appearing or diaphoretic.  HENT:     Head: Normocephalic and atraumatic.     Right Ear: External ear normal.     Left Ear: External ear normal.     Nose: Nose normal.     Mouth/Throat:     Mouth: Mucous membranes are moist.     Pharynx: Oropharynx is clear.  Eyes:     General: No scleral icterus.       Right eye: No discharge.        Left eye: No discharge.     Extraocular Movements: Extraocular movements intact.     Conjunctiva/sclera: Conjunctivae normal.     Pupils: Pupils are equal, round, and reactive to light.  Cardiovascular:     Rate and Rhythm: Normal rate and regular rhythm.     Pulses: Normal pulses.     Heart sounds: Normal heart sounds. No murmur heard.    No friction rub. No gallop.  Pulmonary:     Effort: Pulmonary effort is normal. No respiratory distress.     Breath sounds: Normal breath sounds. No stridor. No wheezing, rhonchi or rales.  Chest:     Chest wall: No tenderness.  Musculoskeletal:  General: Normal range of motion.     Cervical back: Normal range of motion and neck supple.     Right lower leg: No edema.     Left lower leg: No edema.  Skin:    General: Skin is warm and dry.     Capillary Refill: Capillary refill takes less than 2 seconds.     Coloration: Skin is not jaundiced or pale.     Findings: No bruising, erythema, lesion or rash.  Neurological:     General: No focal deficit present.     Mental Status: She is alert and oriented to person, place, and time. Mental status is at baseline.  Psychiatric:        Mood and Affect: Mood normal.        Behavior: Behavior normal.        Thought Content: Thought content normal.        Judgment: Judgment normal.     Results for orders placed or performed in visit on 09/25/21  Comprehensive metabolic panel  Result Value Ref Range   Glucose 122 (H) 70 - 99 mg/dL   BUN 11 6 - 24 mg/dL    Creatinine, Ser 0.66 0.57 - 1.00 mg/dL   eGFR 104 >59 mL/min/1.73   BUN/Creatinine Ratio 17 9 - 23   Sodium 144 134 - 144 mmol/L   Potassium 4.2 3.5 - 5.2 mmol/L   Chloride 104 96 - 106 mmol/L   CO2 27 20 - 29 mmol/L   Calcium 9.6 8.7 - 10.2 mg/dL   Total Protein 6.9 6.0 - 8.5 g/dL   Albumin 4.3 3.8 - 4.9 g/dL   Globulin, Total 2.6 1.5 - 4.5 g/dL   Albumin/Globulin Ratio 1.7 1.2 - 2.2   Bilirubin Total 0.4 0.0 - 1.2 mg/dL   Alkaline Phosphatase 113 44 - 121 IU/L   AST 17 0 - 40 IU/L   ALT 21 0 - 32 IU/L  CBC with Differential/Platelet  Result Value Ref Range   WBC 7.9 3.4 - 10.8 x10E3/uL   RBC 4.79 3.77 - 5.28 x10E6/uL   Hemoglobin 16.2 (H) 11.1 - 15.9 g/dL   Hematocrit 46.8 (H) 34.0 - 46.6 %   MCV 98 (H) 79 - 97 fL   MCH 33.8 (H) 26.6 - 33.0 pg   MCHC 34.6 31.5 - 35.7 g/dL   RDW 13.1 11.7 - 15.4 %   Platelets 283 150 - 450 x10E3/uL   Neutrophils 67 Not Estab. %   Lymphs 21 Not Estab. %   Monocytes 8 Not Estab. %   Eos 3 Not Estab. %   Basos 1 Not Estab. %   Neutrophils Absolute 5.4 1.4 - 7.0 x10E3/uL   Lymphocytes Absolute 1.7 0.7 - 3.1 x10E3/uL   Monocytes Absolute 0.6 0.1 - 0.9 x10E3/uL   EOS (ABSOLUTE) 0.2 0.0 - 0.4 x10E3/uL   Basophils Absolute 0.1 0.0 - 0.2 x10E3/uL   Immature Granulocytes 0 Not Estab. %   Immature Grans (Abs) 0.0 0.0 - 0.1 x10E3/uL  Urinalysis, Routine w reflex microscopic  Result Value Ref Range   Specific Gravity, UA 1.020 1.005 - 1.030   pH, UA 7.0 5.0 - 7.5   Color, UA Yellow Yellow   Appearance Ur Clear Clear   Leukocytes,UA Negative Negative   Protein,UA Negative Negative/Trace   Glucose, UA Negative Negative   Ketones, UA Negative Negative   RBC, UA Negative Negative   Bilirubin, UA Negative Negative   Urobilinogen, Ur 1.0 0.2 - 1.0 mg/dL  Nitrite, UA Negative Negative      Assessment & Plan:   Problem List Items Addressed This Visit       Other   Bilateral knee pain - Primary    Will change from meloxicam which gives her a  headache to voltaren. Recheck in about 2 months. Call with any concerns. Continue to monitor.       Peripheral edema    Resolved. Advised to avoid lasix due to low BP. Will stay off naproxen due to edema. Continue to monitor. Advised compression hose for travel.        Follow up plan: Return in about 2 months (around 12/04/2021).

## 2021-10-17 ENCOUNTER — Other Ambulatory Visit: Payer: Self-pay

## 2021-10-17 MED ORDER — FUROSEMIDE 20 MG PO TABS: 20.0000 mg | ORAL_TABLET | Freq: Every day | ORAL | 0 refills | Status: DC

## 2021-10-17 NOTE — Telephone Encounter (Signed)
LOV was 10/04/21  Next up coming appt 12/04/21

## 2021-10-18 ENCOUNTER — Other Ambulatory Visit: Payer: Medicaid Other

## 2021-10-18 ENCOUNTER — Inpatient Hospital Stay: Admission: RE | Admit: 2021-10-18 | Payer: Medicaid Other | Source: Ambulatory Visit

## 2021-10-21 DIAGNOSIS — M5416 Radiculopathy, lumbar region: Secondary | ICD-10-CM | POA: Diagnosis not present

## 2021-10-25 ENCOUNTER — Other Ambulatory Visit: Payer: Self-pay | Admitting: Family Medicine

## 2021-10-25 DIAGNOSIS — M5416 Radiculopathy, lumbar region: Secondary | ICD-10-CM | POA: Diagnosis not present

## 2021-10-25 NOTE — Telephone Encounter (Signed)
Requested Prescriptions  Pending Prescriptions Disp Refills  . VENTOLIN HFA 108 (90 Base) MCG/ACT inhaler [Pharmacy Med Name: VENTOLIN HFA 90 MCG INHALER] 18 each 3    Sig: TAKE 2 PUFFS BY MOUTH EVERY 4 HOURS AS NEEDED     Pulmonology:  Beta Agonists 2 Passed - 10/25/2021 10:59 AM      Passed - Last BP in normal range    BP Readings from Last 1 Encounters:  10/04/21 110/67         Passed - Last Heart Rate in normal range    Pulse Readings from Last 1 Encounters:  10/04/21 93         Passed - Valid encounter within last 12 months    Recent Outpatient Visits          3 weeks ago Chronic pain of both knees   [facility] [place], Megan P, DO   1 month ago Swelling   [facility] V[place], Avanti, MD   5 months ago Primary osteoarthritis involving multiple joints   [facility] [place], [place], DO   8 months ago Mixed hyperlipidemia   [facility], [place], DO   8 months ago Hives   [facility] Larae Grooms, NP      Future Appointments            In 1 month Johnson, Megan P, DO [facility], [facility]           . gabapentin (NEURONTIN) 400 MG capsule [Pharmacy Med Name: GABAPENTIN 400 MG CAPSULE] 270 capsule 1    Sig: TAKE 1 CAPSULE BY MOUTH 3 TIMES DAILY.     Neurology: Anticonvulsants - gabapentin Passed - 10/25/2021 10:59 AM      Passed - Cr in normal range and within 360 days    Creatinine, Ser  Date Value Ref Range Status  09/25/2021 0.66 0.57 - 1.00 mg/dL Final         Passed - Completed PHQ-2 or PHQ-9 in the last 360 days      Passed - Valid encounter within last 12 months    Recent Outpatient Visits          3 weeks ago Chronic pain of both knees   [facility] [place], Megan P, DO   1 month ago Swelling   [facility] Vigg, Avanti, MD   5 months ago Primary osteoarthritis involving multiple joints   [facility] [place], [place], DO   8  months ago Mixed hyperlipidemia   [employer] [facility], [place], DO   8 months ago Hives   [facility] Larae Grooms, NP      Future Appointments            In 1 month Laural Benes, Oralia Rud, DO [facility], [facility]

## 2021-11-03 ENCOUNTER — Other Ambulatory Visit: Payer: Self-pay | Admitting: Family Medicine

## 2021-11-05 ENCOUNTER — Ambulatory Visit
Admission: RE | Admit: 2021-11-05 | Discharge: 2021-11-05 | Disposition: A | Discharge status: Home / Self Care | Payer: Medicaid Other | Source: Ambulatory Visit | Attending: Family Medicine | Admitting: Family Medicine

## 2021-11-05 DIAGNOSIS — R928 Other abnormal and inconclusive findings on diagnostic imaging of breast: Secondary | ICD-10-CM

## 2021-11-05 DIAGNOSIS — N6489 Other specified disorders of breast: Secondary | ICD-10-CM

## 2021-11-05 NOTE — Telephone Encounter (Signed)
Requested Prescriptions  Pending Prescriptions Disp Refills  . lisinopril (ZESTRIL) 20 MG tablet [Pharmacy Med Name: LISINOPRIL 20 MG TABLET] 90 tablet 1    Sig: TAKE 1 TABLET BY MOUTH EVERY DAY     Cardiovascular:  ACE Inhibitors Passed - 11/03/2021 10:50 AM      Passed - Cr in normal range and within 180 days    Creatinine, Ser  Date Value Ref Range Status  09/25/2021 0.66 0.57 - 1.00 mg/dL Final         Passed - K in normal range and within 180 days    Potassium  Date Value Ref Range Status  09/25/2021 4.2 3.5 - 5.2 mmol/L Final         Passed - Patient is not pregnant      Passed - Last BP in normal range    BP Readings from Last 1 Encounters:  10/04/21 110/67         Passed - Valid encounter within last 6 months    Recent Outpatient Visits          1 month ago Chronic pain of both knees   [facility] [place], Megan P, DO   1 month ago Swelling   [facility] Vigg, Avanti, MD   5 months ago Primary osteoarthritis involving multiple joints   [facility] [place], [place], DO   8 months ago Mixed hyperlipidemia   [employer], [place], DO   9 months ago [employer], Clydie Braun, NP      Future Appointments            In 4 weeks Laural Benes, Oralia Rud, DO [facility], [facility]           . pravastatin (PRAVACHOL) 80 MG tablet [Pharmacy Med Name: PRAVASTATIN SODIUM 80 MG TAB] 90 tablet 1    Sig: TAKE 1 TABLET BY MOUTH EVERY DAY     Cardiovascular:  Antilipid - Statins Failed - 11/03/2021 10:50 AM      Failed - Lipid Panel in normal range within the last 12 months    Cholesterol, Total  Date Value Ref Range Status  02/25/2021 198 100 - 199 mg/dL Final   Cholesterol Piccolo, Waived  Date Value Ref Range Status  01/19/2015 221 (H) <200 mg/dL Final    Comment:                            Desirable                <200                         Borderline High      200- 239                          High                     >239    LDL Chol Calc (NIH)  Date Value Ref Range Status  02/25/2021 97 0 - 99 mg/dL Final   HDL  Date Value Ref Range Status  02/25/2021 88 >39 mg/dL Final   Triglycerides  Date Value Ref Range Status  02/25/2021 74 0 - 149 mg/dL Final   Triglycerides Piccolo,Waived  Date Value Ref Range Status  01/19/2015 91 <150 mg/dL Final  Comment:                            Normal                   <150                         Borderline High     150 - 199                         High                200 - 499                         Very High                >499          Passed - Patient is not pregnant      Passed - Valid encounter within last 12 months    Recent Outpatient Visits          1 month ago Chronic pain of both knees   [facility] [place], Megan P, DO   1 month ago Swelling   [facility] Vigg, Avanti, MD   5 months ago Primary osteoarthritis involving multiple joints   [facility] [place], [place], DO   8 months ago Mixed hyperlipidemia   [employer], [place], DO   9 months ago Hives   [facility] Larae Grooms, NP      Future Appointments            In 4 weeks Laural Benes, Oralia Rud, DO [facility], [facility]

## 2021-11-08 ENCOUNTER — Other Ambulatory Visit: Payer: Self-pay | Admitting: Family Medicine

## 2021-11-08 ENCOUNTER — Other Ambulatory Visit: Payer: Self-pay | Admitting: Nurse Practitioner

## 2021-11-08 NOTE — Telephone Encounter (Signed)
Requested medication (s) are due for refill today: yes  Requested medication (s) are on the active medication list: yes  Last refill:  10/17/21 #30/0  Future visit scheduled: yes  Notes to clinic:  rx was prescribed by Dr. Charlotta Newton who is longer at practice. Please advise     Requested Prescriptions  Pending Prescriptions Disp Refills   furosemide (LASIX) 20 MG tablet [Pharmacy Med Name: FUROSEMIDE 20 MG TABLET] 30 tablet 0    Sig: TAKE 1 TABLET BY MOUTH EVERY DAY     Cardiovascular:  Diuretics - Loop Failed - 11/08/2021 12:39 PM      Failed - Mg Level in normal range and within 180 days    No results found for: "MG"       Passed - K in normal range and within 180 days    Potassium  Date Value Ref Range Status  09/25/2021 4.2 3.5 - 5.2 mmol/L Final         Passed - Ca in normal range and within 180 days    Calcium  Date Value Ref Range Status  09/25/2021 9.6 8.7 - 10.2 mg/dL Final         Passed - Na in normal range and within 180 days    Sodium  Date Value Ref Range Status  09/25/2021 144 134 - 144 mmol/L Final         Passed - Cr in normal range and within 180 days    Creatinine, Ser  Date Value Ref Range Status  09/25/2021 0.66 0.57 - 1.00 mg/dL Final         Passed - Cl in normal range and within 180 days    Chloride  Date Value Ref Range Status  09/25/2021 104 96 - 106 mmol/L Final         Passed - Last BP in normal range    BP Readings from Last 1 Encounters:  10/04/21 110/67         Passed - Valid encounter within last 6 months    Recent Outpatient Visits           1 month ago Chronic pain of both knees   [facility] [place], Megan P, DO   1 month ago Swelling   [facility] Vigg, Avanti, MD   5 months ago Primary osteoarthritis involving multiple joints   [facility] [place], [place], DO   8 months ago Mixed hyperlipidemia   [employer], [place], DO   9 months ago [employer], Clydie Braun, NP       Future Appointments             In 3 weeks Laural Benes, Oralia Rud, DO [employer], [facility]

## 2021-11-08 NOTE — Telephone Encounter (Signed)
Requested Prescriptions  Pending Prescriptions Disp Refills  . nicotine (NICODERM CQ - DOSED IN MG/24 HOURS) 21 mg/24hr patch [Pharmacy Med Name: NICOTINE 21 MG/24HR PATCH] 28 patch 2    Sig: APPLY 1 PATCH ONTO THE SKIN EVERY DAY     Psychiatry:  Drug Dependence Therapy Passed - 11/08/2021 12:39 PM      Passed - Valid encounter within last 12 months    Recent Outpatient Visits          1 month ago Chronic pain of both knees   [facility] [place], Megan P, DO   1 month ago Swelling   [facility] Vigg, Avanti, MD   5 months ago Primary osteoarthritis involving multiple joints   [facility] [place], [place], DO   8 months ago Mixed hyperlipidemia   [employer], [place], DO   9 months ago Hives   [employer], Clydie Braun, NP      Future Appointments            In 3 weeks Laural Benes, Megan P, DO [facility], [facility]           . famotidine (PEPCID) 20 MG tablet [Pharmacy Med Name: FAMOTIDINE 20 MG TABLET] 180 tablet 1    Sig: TAKE 1 TABLET BY MOUTH TWICE A DAY     Gastroenterology:  H2 Antagonists Passed - 11/08/2021 12:39 PM      Passed - Valid encounter within last 12 months    Recent Outpatient Visits          1 month ago Chronic pain of both knees   [facility] [place], Megan P, DO   1 month ago Swelling   [facility] Vigg, Avanti, MD   5 months ago Primary osteoarthritis involving multiple joints   [facility] [place], [place], DO   8 months ago Mixed hyperlipidemia   [employer], [place], DO   9 months ago Hives   [facility] Larae Grooms, NP      Future Appointments            In 3 weeks Laural Benes, Oralia Rud, DO [employer], [facility]

## 2021-11-15 ENCOUNTER — Telehealth: Payer: Self-pay | Admitting: Family Medicine

## 2021-11-15 NOTE — Telephone Encounter (Signed)
..   Medicaid Managed Care   Unsuccessful Outreach Note  11/15/2021 Name: Kalyani Maeda MRN: [id number] DOB: May 19, 1965  Referred by: Dorcas Carrow, DO Reason for referral : High Risk Managed Medicaid (I called the patient today to get her scheduled with the MM Team. Her VM was full.)   An unsuccessful telephone outreach was attempted today. The patient was referred to the case management team for assistance with care management and care coordination.   Follow Up Plan: The care management team will reach out to the patient again over the next 14 days.   Weston Settle Care Guide, High Risk Medicaid Managed Care Embedded Care Coordination [facility]  [facility]    SIGNATURE

## 2021-12-01 ENCOUNTER — Other Ambulatory Visit: Payer: Self-pay | Admitting: Physician Assistant

## 2021-12-02 NOTE — Telephone Encounter (Signed)
Requested medications are due for refill today.  yes  Requested medications are on the active medications list.  yes  Last refill. 11/08/2021 #30 0 refils  Future visit scheduled.   yes  Notes to clinic.  Missing Mg  lab.    Requested Prescriptions  Pending Prescriptions Disp Refills   furosemide (LASIX) 20 MG tablet [Pharmacy Med Name: FUROSEMIDE 20 MG TABLET] 30 tablet 0    Sig: TAKE 1 TABLET BY MOUTH EVERY DAY     Cardiovascular:  Diuretics - Loop Failed - 12/01/2021 10:34 AM      Failed - Mg Level in normal range and within 180 days    No results found for: "MG"       Passed - K in normal range and within 180 days    Potassium  Date Value Ref Range Status  09/25/2021 4.2 3.5 - 5.2 mmol/L Final         Passed - Ca in normal range and within 180 days    Calcium  Date Value Ref Range Status  09/25/2021 9.6 8.7 - 10.2 mg/dL Final         Passed - Na in normal range and within 180 days    Sodium  Date Value Ref Range Status  09/25/2021 144 134 - 144 mmol/L Final         Passed - Cr in normal range and within 180 days    Creatinine, Ser  Date Value Ref Range Status  09/25/2021 0.66 0.57 - 1.00 mg/dL Final         Passed - Cl in normal range and within 180 days    Chloride  Date Value Ref Range Status  09/25/2021 104 96 - 106 mmol/L Final         Passed - Last BP in normal range    BP Readings from Last 1 Encounters:  10/04/21 110/67         Passed - Valid encounter within last 6 months    Recent Outpatient Visits           1 month ago Chronic pain of both knees   [facility] [place], Megan P, DO   2 months ago Swelling   [facility] Vigg, Avanti, MD   6 months ago Primary osteoarthritis involving multiple joints   [facility] [place], [place], DO   9 months ago Mixed hyperlipidemia   [employer], [place], DO   9 months ago [employer], Clydie Braun, NP       Future  Appointments             In 2 days Laural Benes, Oralia Rud, DO [employer], [facility]

## 2021-12-04 ENCOUNTER — Ambulatory Visit: Payer: Medicaid Other | Admitting: Family Medicine

## 2021-12-05 ENCOUNTER — Telehealth: Payer: Self-pay | Admitting: Family Medicine

## 2021-12-05 NOTE — Telephone Encounter (Signed)
..  Patient declines further follow up and engagement by the Managed Medicaid Team. Appropriate care team members and provider have been notified via electronic communication. The Managed Medicaid Team is available to follow up with the patient after provider conversation with the patient regarding recommendation for engagement and subsequent re-referral to the Managed Medicaid Team.    Jennifer Alley Care Guide, High Risk Medicaid Managed Care Embedded Care Coordination [place]  [facility]   

## 2021-12-26 ENCOUNTER — Ambulatory Visit: Payer: Medicaid Other | Admitting: Family Medicine

## 2022-01-01 ENCOUNTER — Other Ambulatory Visit: Payer: Self-pay | Admitting: Family Medicine

## 2022-01-01 NOTE — Telephone Encounter (Signed)
Requested medication (s) are due for refill today: needs alternative medication  Requested medication (s) are on the active medication list: yes  Last refill:  01/01/22  Future visit scheduled: yes  Notes to clinic:  Unable to refill per protocol, last refill by provider 01/01/22.Pharmacy comment: Alternative Requested:DICLOFENAC NOT COVD WOULD REQUIRE PRIOR AUTH.     Requested Prescriptions  Pending Prescriptions Disp Refills   meloxicam (MOBIC) 15 MG tablet [Pharmacy Med Name: MELOXICAM 15 MG TABLET]  0    Sig: TAKE 1 TABLET BY MOUTH TWICE A DAY     Analgesics:  COX2 Inhibitors Failed - 01/01/2022  8:44 AM      Failed - Manual Review: Labs are only required if the patient has taken medication for more than 8 weeks.      Failed - HGB in normal range and within 360 days    Hemoglobin  Date Value Ref Range Status  09/25/2021 16.2 (H) 11.1 - 15.9 g/dL Final         Failed - HCT in normal range and within 360 days    Hematocrit  Date Value Ref Range Status  09/25/2021 46.8 (H) 34.0 - 46.6 % Final         Passed - Cr in normal range and within 360 days    Creatinine, Ser  Date Value Ref Range Status  09/25/2021 0.66 0.57 - 1.00 mg/dL Final         Passed - AST in normal range and within 360 days    AST  Date Value Ref Range Status  09/25/2021 17 0 - 40 IU/L Final         Passed - ALT in normal range and within 360 days    ALT  Date Value Ref Range Status  09/25/2021 21 0 - 32 IU/L Final         Passed - eGFR is 30 or above and within 360 days    GFR calc Af Amer  Date Value Ref Range Status  12/21/2019 123 >59 mL/min/1.73 Final    Comment:    **[facility] currently reports eGFR in compliance with the current**   recommendations of the National Kidney Foundation. [facility] will   update reporting as new guidelines are published from the NKF-ASN   Task force.    GFR calc non Af Amer  Date Value Ref Range Status  12/21/2019 107 >59 mL/min/1.73 Final   eGFR  Date  Value Ref Range Status  09/25/2021 104 >59 mL/min/1.73 Final         Passed - Patient is not pregnant      Passed - Valid encounter within last 12 months    Recent Outpatient Visits           2 months ago Chronic pain of both knees   [facility] [place], Megan P, DO   3 months ago Swelling   [facility] Vigg, Avanti, MD   7 months ago Primary osteoarthritis involving multiple joints   [facility] [place], Megan P, DO   10 months ago Mixed hyperlipidemia   [facility] [place], Megan P, DO   10 months ago Hives   [facility] [place], Karen, NP                

## 2022-01-01 NOTE — Telephone Encounter (Signed)
Requested Prescriptions  Pending Prescriptions Disp Refills  . diclofenac (VOLTAREN) 75 MG EC tablet [Pharmacy Med Name: DICLOFENAC SOD EC 75 MG TAB] 60 tablet 1    Sig: TAKE 1 TABLET BY MOUTH TWICE A DAY     Analgesics:  NSAIDS Failed - 01/01/2022  8:40 AM      Failed - Manual Review: Labs are only required if the patient has taken medication for more than 8 weeks.      Failed - HGB in normal range and within 360 days    Hemoglobin  Date Value Ref Range Status  09/25/2021 16.2 (H) 11.1 - 15.9 g/dL Final         Failed - HCT in normal range and within 360 days    Hematocrit  Date Value Ref Range Status  09/25/2021 46.8 (H) 34.0 - 46.6 % Final         Passed - Cr in normal range and within 360 days    Creatinine, Ser  Date Value Ref Range Status  09/25/2021 0.66 0.57 - 1.00 mg/dL Final         Passed - PLT in normal range and within 360 days    Platelets  Date Value Ref Range Status  09/25/2021 283 150 - 450 x10E3/uL Final         Passed - eGFR is 30 or above and within 360 days    GFR calc Af Amer  Date Value Ref Range Status  12/21/2019 123 >59 mL/min/1.73 Final    Comment:    **[facility] currently reports eGFR in compliance with the current**   recommendations of the [employer]. [facility] will   update reporting as new guidelines are published from the NKF-ASN   Task force.    GFR calc non Af Amer  Date Value Ref Range Status  12/21/2019 107 >59 mL/min/1.73 Final   eGFR  Date Value Ref Range Status  09/25/2021 104 >59 mL/min/1.73 Final         Passed - Patient is not pregnant      Passed - Valid encounter within last 12 months    Recent Outpatient Visits          2 months ago Chronic pain of both knees   [place], Megan P, DO   3 months ago Swelling   [place], MD   7 months ago Primary osteoarthritis involving multiple joints   [place], [place], DO   10 months  ago Mixed hyperlipidemia   [employer], [place], DO   10 months ago [place] Jon Billings, NP

## 2022-01-02 NOTE — Telephone Encounter (Signed)
Needs PA- she's allergic to meloxicam

## 2022-01-12 ENCOUNTER — Other Ambulatory Visit: Payer: Self-pay | Admitting: Family Medicine

## 2022-01-13 NOTE — Telephone Encounter (Signed)
Requested Prescriptions  Pending Prescriptions Disp Refills  . VENTOLIN HFA 108 (90 Base) MCG/ACT inhaler [Pharmacy Med Name: VENTOLIN HFA 90 MCG INHALER] 18 each 3    Sig: INHALE 2 PUFFS BY MOUTH EVERY 4 HOURS AS NEEDED     Pulmonology:  Beta Agonists 2 Passed - 01/13/2022  3:17 PM      Passed - Last BP in normal range    BP Readings from Last 1 Encounters:  10/04/21 110/67         Passed - Last Heart Rate in normal range    Pulse Readings from Last 1 Encounters:  10/04/21 93         Passed - Valid encounter within last 12 months    Recent Outpatient Visits          3 months ago Chronic pain of both knees   [place], Megan P, DO   3 months ago Swelling   [place] Vigg, Avanti, MD   8 months ago Primary osteoarthritis involving multiple joints   [place], [place], DO   10 months ago Mixed hyperlipidemia   [employer], [place], DO   11 months ago [place] Jon Billings, NP

## 2022-01-13 NOTE — Telephone Encounter (Signed)
PA initiated via CoverMyMeds for diclofenac 75MG  tablets  KEY: [id number]  Waiting on determination

## 2022-01-13 NOTE — Telephone Encounter (Signed)
Pt is calling to check on the status of the medication refill.

## 2022-01-14 NOTE — Telephone Encounter (Signed)
PA approved via CoverMyMeds for Diclofenac 75MG  tablets.  Will notify patient via [employer].

## 2022-02-20 ENCOUNTER — Other Ambulatory Visit: Payer: Self-pay | Admitting: Family Medicine

## 2022-02-20 NOTE — Telephone Encounter (Signed)
Requested Prescriptions  Pending Prescriptions Disp Refills   pravastatin (PRAVACHOL) 80 MG tablet [Pharmacy Med Name: PRAVASTATIN SODIUM 80 MG TAB] 90 tablet 1    Sig: TAKE 1 TABLET BY MOUTH EVERY DAY     Cardiovascular:  Antilipid - Statins Failed - 02/20/2022  1:13 PM      Failed - Lipid Panel in normal range within the last 12 months    Cholesterol, Total  Date Value Ref Range Status  02/25/2021 198 100 - 199 mg/dL Final   Cholesterol Piccolo, Waived  Date Value Ref Range Status  01/19/2015 221 (H) <200 mg/dL Final    Comment:                            Desirable                <200                         Borderline High      200- 239                         High                     >239    LDL Chol Calc (NIH)  Date Value Ref Range Status  02/25/2021 97 0 - 99 mg/dL Final   HDL  Date Value Ref Range Status  02/25/2021 88 >39 mg/dL Final   Triglycerides  Date Value Ref Range Status  02/25/2021 74 0 - 149 mg/dL Final   Triglycerides Piccolo,Waived  Date Value Ref Range Status  01/19/2015 91 <150 mg/dL Final    Comment:                            Normal                   <150                         Borderline High     150 - 199                         High                200 - 499                         Very High                >499          Passed - Patient is not pregnant      Passed - Valid encounter within last 12 months    Recent Outpatient Visits           4 months ago Chronic pain of both knees   [address] [place], Megan P, DO   4 months ago Swelling   [facility] Vigg, Avanti, MD   9 months ago Primary osteoarthritis involving multiple joints   [facility] [place], [place], DO   12 months ago Mixed hyperlipidemia   [employer], [place], DO   1 year ago Hives   [facility] Larae Grooms, NP  lisinopril (ZESTRIL) 20 MG tablet [Pharmacy Med Name:  LISINOPRIL 20 MG TABLET] 90 tablet 1    Sig: TAKE 1 TABLET BY MOUTH EVERY DAY     Cardiovascular:  ACE Inhibitors Passed - 02/20/2022  1:13 PM      Passed - Cr in normal range and within 180 days    Creatinine, Ser  Date Value Ref Range Status  09/25/2021 0.66 0.57 - 1.00 mg/dL Final         Passed - K in normal range and within 180 days    Potassium  Date Value Ref Range Status  09/25/2021 4.2 3.5 - 5.2 mmol/L Final         Passed - Patient is not pregnant      Passed - Last BP in normal range    BP Readings from Last 1 Encounters:  10/04/21 110/67         Passed - Valid encounter within last 6 months    Recent Outpatient Visits           4 months ago Chronic pain of both knees   [facility] [place], Megan P, DO   4 months ago Swelling   [facility] Vigg, Avanti, MD   9 months ago Primary osteoarthritis involving multiple joints   [facility] [place], [place], DO   12 months ago Mixed hyperlipidemia   [employer], [place], DO   1 year ago Hives   [facility] Larae Grooms, NP

## 2022-03-16 ENCOUNTER — Other Ambulatory Visit: Payer: Self-pay | Admitting: Family Medicine

## 2022-03-17 NOTE — Telephone Encounter (Signed)
Requested medication (s) are due for refill today - expired Rx  Requested medication (s) are on the active medication list -yes  Future visit scheduled -no  Last refill: 02/25/22 1 12  RF  Notes to clinic: expired Rx- no appointment scheduled   Requested Prescriptions  Pending Prescriptions Disp Refills   SYMBICORT 160-4.5 MCG/ACT inhaler [Pharmacy Med Name: SYMBICORT 160-4.5 MCG INHALER] 10.2 each 12    Sig: INHALE 2 PUFFS INTO THE LUNGS TWICE A DAY     Pulmonology:  Combination Products Passed - 03/16/2022  8:39 AM      Passed - Valid encounter within last 12 months    Recent Outpatient Visits           5 months ago Chronic pain of both knees   [facility] [place], Megan P, DO   5 months ago Swelling   [facility] Vigg, Avanti, MD   10 months ago Primary osteoarthritis involving multiple joints   [facility] [place], [place], DO   1 year ago Mixed hyperlipidemia   [facility] [place], [place], DO   1 year ago Hives   [facility] Larae Grooms, NP                 Requested Prescriptions  Pending Prescriptions Disp Refills   SYMBICORT 160-4.5 MCG/ACT inhaler [Pharmacy Med Name: SYMBICORT 160-4.5 MCG INHALER] 10.2 each 12    Sig: INHALE 2 PUFFS INTO THE LUNGS TWICE A DAY     Pulmonology:  Combination Products Passed - 03/16/2022  8:39 AM      Passed - Valid encounter within last 12 months    Recent Outpatient Visits           5 months ago Chronic pain of both knees   [facility] [place], Megan P, DO   5 months ago Swelling   [facility] Vigg, Avanti, MD   10 months ago Primary osteoarthritis involving multiple joints   [facility] [place], [employer], DO   1 year ago Mixed hyperlipidemia   [facility] [place], [place], DO   1 year ago Hives   [facility] Larae Grooms, NP

## 2022-03-18 ENCOUNTER — Other Ambulatory Visit: Payer: Self-pay | Admitting: Family Medicine

## 2022-03-18 NOTE — Telephone Encounter (Signed)
Patient is overdue for follow up. Please call to schedule and then route to provider for refill.

## 2022-03-18 NOTE — Telephone Encounter (Signed)
Requested Prescriptions  Pending Prescriptions Disp Refills   diclofenac (VOLTAREN) 75 MG EC tablet [Pharmacy Med Name: DICLOFENAC SOD EC 75 MG TAB] 60 tablet 0    Sig: TAKE 1 TABLET BY MOUTH TWICE A DAY     Analgesics:  NSAIDS Failed - 03/18/2022 10:21 AM      Failed - Manual Review: Labs are only required if the patient has taken medication for more than 8 weeks.      Failed - HGB in normal range and within 360 days    Hemoglobin  Date Value Ref Range Status  09/25/2021 16.2 (H) 11.1 - 15.9 g/dL Final         Failed - HCT in normal range and within 360 days    Hematocrit  Date Value Ref Range Status  09/25/2021 46.8 (H) 34.0 - 46.6 % Final         Passed - Cr in normal range and within 360 days    Creatinine, Ser  Date Value Ref Range Status  09/25/2021 0.66 0.57 - 1.00 mg/dL Final         Passed - PLT in normal range and within 360 days    Platelets  Date Value Ref Range Status  09/25/2021 283 150 - 450 x10E3/uL Final         Passed - eGFR is 30 or above and within 360 days    GFR calc Af Amer  Date Value Ref Range Status  12/21/2019 123 >59 mL/min/1.73 Final    Comment:    **[facility] currently reports eGFR in compliance with the current**   recommendations of the [employer]. [facility] will   update reporting as new guidelines are published from the NKF-ASN   Task force.    GFR calc non Af Amer  Date Value Ref Range Status  12/21/2019 107 >59 mL/min/1.73 Final   eGFR  Date Value Ref Range Status  09/25/2021 104 >59 mL/min/1.73 Final         Passed - Patient is not pregnant      Passed - Valid encounter within last 12 months    Recent Outpatient Visits           5 months ago Chronic pain of both knees   [place], Megan P, DO   5 months ago [place], MD   10 months ago Primary osteoarthritis involving multiple joints   [place], [place], DO   1 year  ago Mixed hyperlipidemia   [facility] [place], [place], DO   1 year ago [place] Jon Billings, NP

## 2022-03-18 NOTE — Telephone Encounter (Signed)
LVM asking patient to call back to schedule an appointment 

## 2022-03-21 ENCOUNTER — Encounter: Payer: Self-pay | Admitting: Family Medicine

## 2022-03-26 ENCOUNTER — Other Ambulatory Visit: Payer: Self-pay | Admitting: Family Medicine

## 2022-03-26 NOTE — Telephone Encounter (Signed)
Unable to refill per protocol, request is too soon. Last refill 01/13/22 for 18 each and 3 refills.  Requested Prescriptions  Pending Prescriptions Disp Refills   VENTOLIN HFA 108 (90 Base) MCG/ACT inhaler [Pharmacy Med Name: VENTOLIN HFA 90 MCG INHALER] 18 each 3    Sig: TAKE 2 PUFFS BY MOUTH EVERY 4 HOURS AS NEEDED     Pulmonology:  Beta Agonists 2 Passed - 03/26/2022  3:32 PM      Passed - Last BP in normal range    BP Readings from Last 1 Encounters:  10/04/21 110/67         Passed - Last Heart Rate in normal range    Pulse Readings from Last 1 Encounters:  10/04/21 93         Passed - Valid encounter within last 12 months    Recent Outpatient Visits           5 months ago Chronic pain of both knees   [facility] [place], Megan P, DO   6 months ago Swelling   [facility] Vigg, Avanti, MD   10 months ago Primary osteoarthritis involving multiple joints   [facility] [place], [place], DO   1 year ago Mixed hyperlipidemia   [facility] [place], [place], DO   1 year ago Hives   [facility] Larae Grooms, NP

## 2022-04-15 ENCOUNTER — Other Ambulatory Visit: Payer: Self-pay | Admitting: Family Medicine

## 2022-04-15 NOTE — Telephone Encounter (Signed)
Requested Prescriptions  Pending Prescriptions Disp Refills   omeprazole (PRILOSEC) 20 MG capsule [Pharmacy Med Name: OMEPRAZOLE DR 20 MG CAPSULE] 90 capsule 0    Sig: TAKE 1 CAPSULE BY MOUTH EVERY DAY     Gastroenterology: Proton Pump Inhibitors Passed - 04/15/2022  1:23 AM      Passed - Valid encounter within last 12 months    Recent Outpatient Visits           6 months ago Chronic pain of both knees   [place], Megan P, DO   6 months ago Swelling   [place], MD   11 months ago Primary osteoarthritis involving multiple joints   [place], [place], DO   1 year ago Mixed hyperlipidemia   [facility] [place], [place], DO   1 year ago [place] Jon Billings, NP

## 2022-04-21 ENCOUNTER — Telehealth: Payer: Self-pay

## 2022-04-21 ENCOUNTER — Ambulatory Visit (INDEPENDENT_AMBULATORY_CARE_PROVIDER_SITE_OTHER): Payer: Medicaid Other | Admitting: Family Medicine

## 2022-04-21 ENCOUNTER — Encounter: Payer: Self-pay | Admitting: Family Medicine

## 2022-04-21 VITALS — BP 131/82 | HR 97 | Temp 99.6°F | Ht 60.0 in | Wt 250.8 lb

## 2022-04-21 DIAGNOSIS — J410 Simple chronic bronchitis: Secondary | ICD-10-CM | POA: Diagnosis not present

## 2022-04-21 DIAGNOSIS — N912 Amenorrhea, unspecified: Secondary | ICD-10-CM

## 2022-04-21 DIAGNOSIS — E782 Mixed hyperlipidemia: Secondary | ICD-10-CM | POA: Diagnosis not present

## 2022-04-21 DIAGNOSIS — I129 Hypertensive chronic kidney disease with stage 1 through stage 4 chronic kidney disease, or unspecified chronic kidney disease: Secondary | ICD-10-CM

## 2022-04-21 DIAGNOSIS — Z720 Tobacco use: Secondary | ICD-10-CM | POA: Diagnosis not present

## 2022-04-21 DIAGNOSIS — Z1211 Encounter for screening for malignant neoplasm of colon: Secondary | ICD-10-CM

## 2022-04-21 LAB — MICROALBUMIN, URINE WAIVED
Creatinine, Urine Waived: 100 mg/dL (ref 10–300)
Microalb, Ur Waived: 30 mg/L — ABNORMAL HIGH (ref 0–19)
Microalb/Creat Ratio: <30 mg/g (ref <30)

## 2022-04-21 LAB — URINALYSIS, ROUTINE W REFLEX MICROSCOPIC
Bilirubin, UA: NEGATIVE
Glucose, UA: NEGATIVE
Ketones, UA: NEGATIVE
Leukocytes,UA: NEGATIVE
Nitrite, UA: NEGATIVE
Protein,UA: NEGATIVE
RBC, UA: NEGATIVE
Specific Gravity, UA: 1.025 (ref 1.005–1.030)
Urobilinogen, Ur: 0.2 mg/dL (ref 0.2–1.0)
pH, UA: 5 (ref 5.0–7.5)

## 2022-04-21 LAB — PREGNANCY, URINE: Preg Test, Ur: NEGATIVE

## 2022-04-21 MED ORDER — MONTELUKAST SODIUM 10 MG PO TABS: ORAL_TABLET | ORAL | 1 refills | Status: DC

## 2022-04-21 MED ORDER — OMEPRAZOLE 20 MG PO CPDR: 20.0000 mg | DELAYED_RELEASE_CAPSULE | Freq: Every day | ORAL | 1 refills | Status: DC

## 2022-04-21 MED ORDER — ALBUTEROL SULFATE (2.5 MG/3ML) 0.083% IN NEBU: 2.5000 mg | INHALATION_SOLUTION | RESPIRATORY_TRACT | 6 refills | Status: DC | PRN

## 2022-04-21 MED ORDER — FLUTICASONE PROPIONATE 50 MCG/ACT NA SUSP: NASAL | 6 refills | Status: DC

## 2022-04-21 MED ORDER — LISINOPRIL 20 MG PO TABS: 20.0000 mg | ORAL_TABLET | Freq: Every day | ORAL | 1 refills | Status: DC

## 2022-04-21 MED ORDER — BUDESONIDE-FORMOTEROL FUMARATE 160-4.5 MCG/ACT IN AERO: 2.0000 | INHALATION_SPRAY | Freq: Two times a day (BID) | RESPIRATORY_TRACT | 12 refills | Status: DC

## 2022-04-21 MED ORDER — ALBUTEROL SULFATE HFA 108 (90 BASE) MCG/ACT IN AERS: 2.0000 | INHALATION_SPRAY | RESPIRATORY_TRACT | 3 refills | Status: DC | PRN

## 2022-04-21 MED ORDER — GABAPENTIN 400 MG PO CAPS: 400.0000 mg | ORAL_CAPSULE | Freq: Three times a day (TID) | ORAL | 1 refills | Status: DC

## 2022-04-21 MED ORDER — PRAVASTATIN SODIUM 80 MG PO TABS: 80.0000 mg | ORAL_TABLET | Freq: Every day | ORAL | 1 refills | Status: DC

## 2022-04-21 NOTE — Assessment & Plan Note (Signed)
Under good control on current regimen. Continue current regimen. Continue to monitor. Call with any concerns. Refills given. Labs drawn today. Due for low dose CT. Ordered today.

## 2022-04-21 NOTE — Telephone Encounter (Signed)
-----  Message from Valerie Roys, DO sent at 04/21/2022  9:59 AM EST ----- She says she did her cologuard- but I don't see if on care everywhere- can we please check with company?

## 2022-04-21 NOTE — Assessment & Plan Note (Signed)
Under good control on current regimen. Continue current regimen. Continue to monitor. Call with any concerns. Refills given. Labs drawn today.

## 2022-04-21 NOTE — Telephone Encounter (Signed)
Spoke with [employer] and was informed the order has since expired and patient never completed the kit that was sent to her. Representative says patient would need a new order if she is due for Colonoscopy.

## 2022-04-21 NOTE — Assessment & Plan Note (Signed)
Due for low dose CT. Ordered today.

## 2022-04-21 NOTE — Progress Notes (Signed)
BP 131/82   Pulse 97   Temp 99.6 F (37.6 C) (Oral)   Ht 5' (1.524 m)   Wt 250 lb 12.8 oz (113.8 kg)   LMP 08/02/2017 (Exact Date)   SpO2 97%   BMI 48.98 kg/m    Subjective:    Patient ID: Nancy Stout, female    DOB: [birth year], 57 y.o.   MRN: [id number]  HPI: Nancy Stout is a 57 y.o. female  Chief Complaint  Patient presents with   COPD   Hypertension   Hyperlipidemia   HYPERTENSION / HYPERLIPIDEMIA Satisfied with current treatment? yes Duration of hypertension: chronic BP monitoring frequency: not checking BP medication side effects: no Past BP meds: lisinopril Duration of hyperlipidemia: chronic Cholesterol medication side effects: no Cholesterol supplements: none Past cholesterol medications: pravastatin Medication compliance: excellent compliance Aspirin: no Recent stressors: no Recurrent headaches: no Visual changes: no Palpitations: no Dyspnea: no Chest pain: no Lower extremity edema: no Dizzy/lightheaded: no  COPD COPD status: stable Satisfied with current treatment?: yes Oxygen use: no Dyspnea frequency: occasionally Cough frequency: daily Rescue inhaler frequency: often   Limitation of activity: no Productive cough: no Pneumovax: Up to Date Influenza: Not up to Date    Relevant past medical, surgical, family and social history reviewed and updated as indicated. Interim medical history since our last visit reviewed. Allergies and medications reviewed and updated.  Review of Systems  Constitutional: Negative.   HENT: Negative.    Respiratory:  Positive for cough. Negative for apnea, choking, chest tightness, shortness of breath, wheezing and stridor.   Cardiovascular: Negative.   Gastrointestinal: Negative.   Musculoskeletal: Negative.   Neurological: Negative.   Psychiatric/Behavioral: Negative.      Per HPI unless specifically indicated above     Objective:    BP 131/82   Pulse 97   Temp 99.6 F (37.6 C) (Oral)    Ht 5' (1.524 m)   Wt 250 lb 12.8 oz (113.8 kg)   LMP 08/02/2017 (Exact Date)   SpO2 97%   BMI 48.98 kg/m   Wt Readings from Last 3 Encounters:  04/21/22 250 lb 12.8 oz (113.8 kg)  10/04/21 242 lb (109.8 kg)  09/25/21 245 lb 12.8 oz (111.5 kg)    Physical Exam Vitals and nursing note reviewed.  Constitutional:      General: She is not in acute distress.    Appearance: Normal appearance. She is obese. She is not ill-appearing, toxic-appearing or diaphoretic.  HENT:     Head: Normocephalic and atraumatic.     Right Ear: External ear normal.     Left Ear: External ear normal.     Nose: Nose normal.     Mouth/Throat:     Mouth: Mucous membranes are moist.     Pharynx: Oropharynx is clear.  Eyes:     General: No scleral icterus.       Right eye: No discharge.        Left eye: No discharge.     Extraocular Movements: Extraocular movements intact.     Conjunctiva/sclera: Conjunctivae normal.     Pupils: Pupils are equal, round, and reactive to light.  Cardiovascular:     Rate and Rhythm: Normal rate and regular rhythm.     Pulses: Normal pulses.     Heart sounds: Normal heart sounds. No murmur heard.    No friction rub. No gallop.  Pulmonary:     Effort: Pulmonary effort is normal. No respiratory distress.     Breath sounds: No  stridor. Wheezing present. No rhonchi or rales.  Chest:     Chest wall: No tenderness.  Musculoskeletal:        General: Normal range of motion.     Cervical back: Normal range of motion and neck supple.  Skin:    General: Skin is warm and dry.     Capillary Refill: Capillary refill takes less than 2 seconds.     Coloration: Skin is not jaundiced or pale.     Findings: No bruising, erythema, lesion or rash.  Neurological:     General: No focal deficit present.     Mental Status: She is alert and oriented to person, place, and time. Mental status is at baseline.  Psychiatric:        Mood and Affect: Mood normal.        Behavior: Behavior normal.         Thought Content: Thought content normal.        Judgment: Judgment normal.     Results for orders placed or performed in visit on 09/25/21  Comprehensive metabolic panel  Result Value Ref Range   Glucose 122 (H) 70 - 99 mg/dL   BUN 11 6 - 24 mg/dL   Creatinine, Ser 0.66 0.57 - 1.00 mg/dL   eGFR 104 >59 mL/min/1.73   BUN/Creatinine Ratio 17 9 - 23   Sodium 144 134 - 144 mmol/L   Potassium 4.2 3.5 - 5.2 mmol/L   Chloride 104 96 - 106 mmol/L   CO2 27 20 - 29 mmol/L   Calcium 9.6 8.7 - 10.2 mg/dL   Total Protein 6.9 6.0 - 8.5 g/dL   Albumin 4.3 3.8 - 4.9 g/dL   Globulin, Total 2.6 1.5 - 4.5 g/dL   Albumin/Globulin Ratio 1.7 1.2 - 2.2   Bilirubin Total 0.4 0.0 - 1.2 mg/dL   Alkaline Phosphatase 113 44 - 121 IU/L   AST 17 0 - 40 IU/L   ALT 21 0 - 32 IU/L  CBC with Differential/Platelet  Result Value Ref Range   WBC 7.9 3.4 - 10.8 x10E3/uL   RBC 4.79 3.77 - 5.28 x10E6/uL   Hemoglobin 16.2 (H) 11.1 - 15.9 g/dL   Hematocrit 46.8 (H) 34.0 - 46.6 %   MCV 98 (H) 79 - 97 fL   MCH 33.8 (H) 26.6 - 33.0 pg   MCHC 34.6 31.5 - 35.7 g/dL   RDW 13.1 11.7 - 15.4 %   Platelets 283 150 - 450 x10E3/uL   Neutrophils 67 Not Estab. %   Lymphs 21 Not Estab. %   Monocytes 8 Not Estab. %   Eos 3 Not Estab. %   Basos 1 Not Estab. %   Neutrophils Absolute 5.4 1.4 - 7.0 x10E3/uL   Lymphocytes Absolute 1.7 0.7 - 3.1 x10E3/uL   Monocytes Absolute 0.6 0.1 - 0.9 x10E3/uL   EOS (ABSOLUTE) 0.2 0.0 - 0.4 x10E3/uL   Basophils Absolute 0.1 0.0 - 0.2 x10E3/uL   Immature Granulocytes 0 Not Estab. %   Immature Grans (Abs) 0.0 0.0 - 0.1 x10E3/uL  Urinalysis, Routine w reflex microscopic  Result Value Ref Range   Specific Gravity, UA 1.020 1.005 - 1.030   pH, UA 7.0 5.0 - 7.5   Color, UA Yellow Yellow   Appearance Ur Clear Clear   Leukocytes,UA Negative Negative   Protein,UA Negative Negative/Trace   Glucose, UA Negative Negative   Ketones, UA Negative Negative   RBC, UA Negative Negative    Bilirubin, UA Negative Negative  Urobilinogen, Ur 1.0 0.2 - 1.0 mg/dL   Nitrite, UA Negative Negative      Assessment & Plan:   Problem List Items Addressed This Visit       Respiratory   COPD (chronic obstructive pulmonary disease) (HCC)    Under good control on current regimen. Continue current regimen. Continue to monitor. Call with any concerns. Refills given. Labs drawn today. Due for low dose CT. Ordered today.       Relevant Medications   budesonide-formoterol (SYMBICORT) 160-4.5 MCG/ACT inhaler   albuterol (PROVENTIL) (2.5 MG/3ML) 0.083% nebulizer solution   fluticasone (FLONASE) 50 MCG/ACT nasal spray   montelukast (SINGULAIR) 10 MG tablet   albuterol (VENTOLIN HFA) 108 (90 Base) MCG/ACT inhaler     Genitourinary   Benign hypertensive renal disease - Primary    Under good control on current regimen. Continue current regimen. Continue to monitor. Call with any concerns. Refills given. Labs drawn today.      Relevant Orders   CBC with Differential/Platelet   Comprehensive metabolic panel   Urinalysis, Routine w reflex microscopic   TSH   Microalbumin, Urine Waived     Other   Hyperlipidemia    Under good control on current regimen. Continue current regimen. Continue to monitor. Call with any concerns. Refills given. Labs drawn today.       Relevant Medications   lisinopril (ZESTRIL) 20 MG tablet   pravastatin (PRAVACHOL) 80 MG tablet   Other Relevant Orders   CBC with Differential/Platelet   Comprehensive metabolic panel   Lipid Panel w/o Chol/HDL Ratio   Tobacco abuse    Due for low dose CT. Ordered today.      Relevant Orders   CT CHEST LUNG CA SCREEN LOW DOSE W/O CM   Other Visit Diagnoses     Amenorrhea       Requests pregnancy test- likely menopause. Will check today.   Relevant Orders   Pregnancy, urine        Follow up plan: Return 1 month physical with pap.

## 2022-04-22 LAB — CBC WITH DIFFERENTIAL/PLATELET
Basophils Absolute: 0.1 10*3/uL (ref 0.0–0.2)
Basos: 1 %
EOS (ABSOLUTE): 0.3 10*3/uL (ref 0.0–0.4)
Eos: 4 %
Hematocrit: 51.7 % — ABNORMAL HIGH (ref 34.0–46.6)
Hemoglobin: 17.6 g/dL — ABNORMAL HIGH (ref 11.1–15.9)
Immature Grans (Abs): 0 10*3/uL (ref 0.0–0.1)
Immature Granulocytes: 0 %
Lymphocytes Absolute: 1.9 10*3/uL (ref 0.7–3.1)
Lymphs: 24 %
MCH: 32.5 pg (ref 26.6–33.0)
MCHC: 34 g/dL (ref 31.5–35.7)
MCV: 96 fL (ref 79–97)
Monocytes Absolute: 0.6 10*3/uL (ref 0.1–0.9)
Monocytes: 8 %
Neutrophils Absolute: 4.8 10*3/uL (ref 1.4–7.0)
Neutrophils: 63 %
Platelets: 292 10*3/uL (ref 150–450)
RBC: 5.41 x10E6/uL — ABNORMAL HIGH (ref 3.77–5.28)
RDW: 12.5 % (ref 11.7–15.4)
WBC: 7.8 10*3/uL (ref 3.4–10.8)

## 2022-04-22 LAB — LIPID PANEL W/O CHOL/HDL RATIO
Cholesterol, Total: 233 mg/dL — ABNORMAL HIGH (ref 100–199)
HDL: 59 mg/dL (ref >39)
LDL Chol Calc (NIH): 158 mg/dL — ABNORMAL HIGH (ref 0–99)
Triglycerides: 91 mg/dL (ref 0–149)
VLDL Cholesterol Cal: 16 mg/dL (ref 5–40)

## 2022-04-22 LAB — COMPREHENSIVE METABOLIC PANEL
ALT: 26 IU/L (ref 0–32)
AST: 20 IU/L (ref 0–40)
Albumin/Globulin Ratio: 1.5 (ref 1.2–2.2)
Albumin: 4.4 g/dL (ref 3.8–4.9)
Alkaline Phosphatase: 131 IU/L — ABNORMAL HIGH (ref 44–121)
BUN/Creatinine Ratio: 23 (ref 9–23)
BUN: 15 mg/dL (ref 6–24)
Bilirubin Total: 0.3 mg/dL (ref 0.0–1.2)
CO2: 25 mmol/L (ref 20–29)
Calcium: 9.5 mg/dL (ref 8.7–10.2)
Chloride: 100 mmol/L (ref 96–106)
Creatinine, Ser: 0.65 mg/dL (ref 0.57–1.00)
Globulin, Total: 2.9 g/dL (ref 1.5–4.5)
Glucose: 123 mg/dL — ABNORMAL HIGH (ref 70–99)
Potassium: 4.9 mmol/L (ref 3.5–5.2)
Sodium: 138 mmol/L (ref 134–144)
Total Protein: 7.3 g/dL (ref 6.0–8.5)
eGFR: 103 mL/min/{1.73_m2} (ref >59)

## 2022-04-22 LAB — TSH: TSH: 1.49 u[IU]/mL (ref 0.450–4.500)

## 2022-05-05 DIAGNOSIS — Z1211 Encounter for screening for malignant neoplasm of colon: Secondary | ICD-10-CM | POA: Diagnosis not present

## 2022-05-15 ENCOUNTER — Other Ambulatory Visit: Payer: Self-pay | Admitting: Family Medicine

## 2022-05-15 NOTE — Telephone Encounter (Signed)
Requested Prescriptions  Pending Prescriptions Disp Refills   famotidine (PEPCID) 20 MG tablet [Pharmacy Med Name: FAMOTIDINE 20 MG TABLET] 180 tablet 1    Sig: TAKE 1 TABLET BY MOUTH TWICE A DAY     Gastroenterology:  H2 Antagonists Passed - 05/15/2022  1:23 AM      Passed - Valid encounter within last 12 months    Recent Outpatient Visits           3 weeks ago Benign hypertensive renal disease   [place], Megan P, DO   7 months ago Chronic pain of both knees   [place], Megan P, DO   7 months ago [place] Vigg, Avanti, MD   12 months ago Primary osteoarthritis involving multiple joints   [place], [place], DO   1 year ago Mixed hyperlipidemia   [place], Barb Merino, DO       Future Appointments             In 1 week Wynetta Emery, Barb Merino, DO [place], [facility]

## 2022-05-17 LAB — COLOGUARD: COLOGUARD: NEGATIVE

## 2022-05-22 ENCOUNTER — Encounter: Payer: Medicaid Other | Admitting: Family Medicine

## 2022-06-03 ENCOUNTER — Other Ambulatory Visit: Payer: Self-pay | Admitting: Family Medicine

## 2022-06-03 NOTE — Telephone Encounter (Signed)
Requested Prescriptions  Pending Prescriptions Disp Refills   diclofenac (VOLTAREN) 75 MG EC tablet [Pharmacy Med Name: DICLOFENAC SOD EC 75 MG TAB] 60 tablet 0    Sig: TAKE 1 TABLET BY MOUTH TWICE A DAY     Analgesics:  NSAIDS Failed - 06/03/2022 10:39 AM      Failed - Manual Review: Labs are only required if the patient has taken medication for more than 8 weeks.      Failed - HGB in normal range and within 360 days    Hemoglobin  Date Value Ref Range Status  04/21/2022 17.6 (H) 11.1 - 15.9 g/dL Final         Failed - HCT in normal range and within 360 days    Hematocrit  Date Value Ref Range Status  04/21/2022 51.7 (H) 34.0 - 46.6 % Final         Passed - Cr in normal range and within 360 days    Creatinine, Ser  Date Value Ref Range Status  04/21/2022 0.65 0.57 - 1.00 mg/dL Final         Passed - PLT in normal range and within 360 days    Platelets  Date Value Ref Range Status  04/21/2022 292 150 - 450 x10E3/uL Final         Passed - eGFR is 30 or above and within 360 days    GFR calc Af Amer  Date Value Ref Range Status  12/21/2019 123 >59 mL/min/1.73 Final    Comment:    **[facility] currently reports eGFR in compliance with the current**   recommendations of the [employer]. [facility] will   update reporting as new guidelines are published from the NKF-ASN   Task force.    GFR calc non Af Amer  Date Value Ref Range Status  12/21/2019 107 >59 mL/min/1.73 Final   eGFR  Date Value Ref Range Status  04/21/2022 103 >59 mL/min/1.73 Final         Passed - Patient is not pregnant      Passed - Valid encounter within last 12 months    Recent Outpatient Visits           1 month ago Benign hypertensive renal disease   [place], Megan P, DO   8 months ago Chronic pain of both knees   [place], Megan P, DO   8 months ago [place] Vigg,  Avanti, MD   1 year ago Primary osteoarthritis involving multiple joints   [place], [place], DO   1 year ago Mixed hyperlipidemia   [place], Barb Merino, DO       Future Appointments             In 2 weeks Wynetta Emery, Barb Merino, DO [place], [facility]

## 2022-06-17 ENCOUNTER — Encounter: Payer: Medicaid Other | Admitting: Family Medicine

## 2022-06-29 ENCOUNTER — Emergency Department
Admission: EM | Admit: 2022-06-29 | Discharge: 2022-06-30 | Disposition: A | Discharge status: Home / Self Care | Payer: Medicaid Other | Attending: Emergency Medicine | Admitting: Emergency Medicine

## 2022-06-29 ENCOUNTER — Emergency Department: Payer: Medicaid Other

## 2022-06-29 ENCOUNTER — Encounter: Payer: Self-pay | Admitting: Emergency Medicine

## 2022-06-29 DIAGNOSIS — J449 Chronic obstructive pulmonary disease, unspecified: Secondary | ICD-10-CM | POA: Insufficient documentation

## 2022-06-29 DIAGNOSIS — R0789 Other chest pain: Secondary | ICD-10-CM | POA: Diagnosis not present

## 2022-06-29 DIAGNOSIS — J45909 Unspecified asthma, uncomplicated: Secondary | ICD-10-CM | POA: Insufficient documentation

## 2022-06-29 DIAGNOSIS — R079 Chest pain, unspecified: Secondary | ICD-10-CM | POA: Insufficient documentation

## 2022-06-29 DIAGNOSIS — I10 Essential (primary) hypertension: Secondary | ICD-10-CM | POA: Insufficient documentation

## 2022-06-29 LAB — CBC
HCT: 50.6 % — ABNORMAL HIGH (ref 36.0–46.0)
Hemoglobin: 17 g/dL — ABNORMAL HIGH (ref 12.0–15.0)
MCH: 32.8 pg (ref 26.0–34.0)
MCHC: 33.6 g/dL (ref 30.0–36.0)
MCV: 97.5 fL (ref 80.0–100.0)
Platelets: 265 10*3/uL (ref 150–400)
RBC: 5.19 MIL/uL — ABNORMAL HIGH (ref 3.87–5.11)
RDW: 13.9 % (ref 11.5–15.5)
WBC: 8.8 10*3/uL (ref 4.0–10.5)
nRBC: 0 % (ref 0.0–0.2)

## 2022-06-29 LAB — BASIC METABOLIC PANEL
Anion gap: 6 (ref 5–15)
BUN: 17 mg/dL (ref 6–20)
CO2: 24 mmol/L (ref 22–32)
Calcium: 8.4 mg/dL — ABNORMAL LOW (ref 8.9–10.3)
Chloride: 104 mmol/L (ref 98–111)
Creatinine, Ser: 0.77 mg/dL (ref 0.44–1.00)
GFR, Estimated: >60 mL/min (ref >60)
Glucose, Bld: 109 mg/dL — ABNORMAL HIGH (ref 70–99)
Potassium: 4.5 mmol/L (ref 3.5–5.1)
Sodium: 134 mmol/L — ABNORMAL LOW (ref 135–145)

## 2022-06-29 LAB — TROPONIN I (HIGH SENSITIVITY): Troponin I (High Sensitivity): 4 ng/L (ref <18)

## 2022-06-29 NOTE — ED Triage Notes (Signed)
Pt presents ambulatory to triage via POV with complaints of mid-sternal CP that started ~30 mins ago and is worsened when taking a deep breath. Rates the pain 7/10 - describes the pain as "stabbing" without radiation. A&Ox4 at this time. Denies fevers, chills, N/V/D, SOB.

## 2022-06-29 NOTE — ED Provider Notes (Signed)
[facility] Provider Note    Event Date/Time   First MD Initiated Contact with Patient 06/29/22 2259     (approximate)   History   Chief Complaint Chest Pain   HPI  Nancy Stout is a 57 y.o. female with past medical history of hypertension, hyperlipidemia, asthma, COPD, and polycythemia who presents to the ED complaining of chest pain.  Patient reports that she had sudden onset of sharp pain in the center of her chest around 8:30 PM while she was watching TV.  Pain is worse when she takes a deep breath, also seems to move towards the middle of her back.  She has been feeling slightly short of breath, but denies any fevers, cough, nausea, vomiting, or abdominal pain.  She reports similar episodes of pain in the past that have typically resolved on their own, but tonight's pain has been more severe.  She has not noticed any pain or swelling in her legs.     Physical Exam   Triage Vital Signs: ED Triage Vitals [06/29/22 2131]  Enc Vitals Group     BP (!) 153/83     Pulse Rate 98     Resp 20     Temp 98.3 F (36.8 C)     Temp src      SpO2 97 %     Weight 253 lb (114.8 kg)     Height 5' (1.524 m)     Head Circumference      Peak Flow      Pain Score 7     Pain Loc      Pain Edu?      Excl. in GC?     Most recent vital signs: Vitals:   06/29/22 2131 06/29/22 2242  BP: (!) 153/83 (!) 142/82  Pulse: 98 91  Resp: 20 15  Temp: 98.3 F (36.8 C)   SpO2: 97% 97%    Constitutional: Alert and oriented. Eyes: Conjunctivae are normal. Head: Atraumatic. Nose: No congestion/rhinnorhea. Mouth/Throat: Mucous membranes are moist.  Cardiovascular: Normal rate, regular rhythm. Grossly normal heart sounds.  2+ radial pulses bilaterally. Respiratory: Normal respiratory effort.  No retractions. Lungs CTAB.  No chest wall tenderness to palpation noted. Gastrointestinal: Soft and nontender. No distention. Musculoskeletal: No lower extremity tenderness  nor edema.  Neurologic:  Normal speech and language. No gross focal neurologic deficits are appreciated.    ED Results / Procedures / Treatments   Labs (all labs ordered are listed, but only abnormal results are displayed) Labs Reviewed  BASIC METABOLIC PANEL - Abnormal; Notable for the following components:      Result Value   Sodium 134 (*)    Glucose, Bld 109 (*)    Calcium 8.4 (*)    All other components within normal limits  CBC - Abnormal; Notable for the following components:   RBC 5.19 (*)    Hemoglobin 17.0 (*)    HCT 50.6 (*)    All other components within normal limits  HCG, QUANTITATIVE, PREGNANCY - Abnormal; Notable for the following components:   hCG, Beta Chain, Quant, S 7 (*)    All other components within normal limits  D-DIMER, QUANTITATIVE  POC URINE PREG, ED  TROPONIN I (HIGH SENSITIVITY)  TROPONIN I (HIGH SENSITIVITY)     EKG  ED ECG REPORT I, Chesley Noon, the attending physician, personally viewed and interpreted this ECG.   Date: 06/29/2022  EKG Time: 21:34  Rate: 99  Rhythm: normal sinus rhythm  Axis: Normal  Intervals:none  ST&T Change: None  RADIOLOGY CXR reviewed and interpreted by me with no infiltrate, edema, or effusion.  PROCEDURES:  Critical Care performed: No  Procedures   MEDICATIONS ORDERED IN ED: Medications - No data to display   IMPRESSION / MDM / ASSESSMENT AND PLAN / ED COURSE  I reviewed the triage vital signs and the nursing notes.                              57 y.o. female with past medical history of hypertension, hyperlipidemia, asthma, COPD, and polycythemia who presents to the ED complaining of sudden onset pleuritic chest pain.  Patient's presentation is most consistent with acute presentation with potential threat to life or bodily function.  Differential diagnosis includes, but is not limited to, ACS, PE, pneumonia, pneumothorax, dissection, musculoskeletal pain, GERD, and anxiety.  Patient  nontoxic-appearing and in no acute distress, vital signs are unremarkable.  EKG shows no evidence of arrhythmia or ischemia and symptoms seem atypical for ACS, initial troponin within normal limits.  We will observe on cardiac monitor and trend troponin, also check D-dimer given pleuritic pain.  Chest x-ray is unremarkable and additional labs are reassuring with no significant anemia, leukocytosis, lecture abnormality, or AKI.  Patient declines any pain medication.  Repeat troponin within normal limits, D-dimer is also reassuring.  On reassessment, patient reports that chest pain is resolving.  Given reassuring workup, patient would be appropriate for outpatient management and PCP follow-up.  She was counseled to return to the ED for new or worsening symptoms, patient agrees with plan.      FINAL CLINICAL IMPRESSION(S) / ED DIAGNOSES   Final diagnoses:  Nonspecific chest pain     Rx / DC Orders   ED Discharge Orders     None        Note:  This document was prepared using Dragon voice recognition software and may include unintentional dictation errors.   Chesley Noon, MD 06/30/22 Georgiann Mohs

## 2022-06-30 LAB — D-DIMER, QUANTITATIVE: D-Dimer, Quant: 0.42 ug/mL-FEU (ref 0.00–0.50)

## 2022-06-30 LAB — TROPONIN I (HIGH SENSITIVITY): Troponin I (High Sensitivity): 4 ng/L (ref <18)

## 2022-06-30 LAB — HCG, QUANTITATIVE, PREGNANCY: hCG, Beta Chain, Quant, S: 7 m[IU]/mL — ABNORMAL HIGH (ref <5)

## 2022-07-01 ENCOUNTER — Telehealth: Payer: Self-pay | Admitting: Licensed Clinical Social Worker

## 2022-07-01 NOTE — Transitions of Care (Post Inpatient/ED Visit) (Signed)
   07/01/2022  Name: Moyinoluwa Angelastro MRN: [id number] DOB: [birth year]  Today's TOC FU Call Status: Today's TOC FU Call Status:: Unsuccessul Call (1st Attempt) Unsuccessful Call (1st Attempt) Date: 07/01/22  Attempted to reach the patient regarding the most recent Inpatient/ED visit.  Follow Up Plan: Additional outreach attempts will be made to reach the patient to complete the Transitions of Care (Post Inpatient/ED visit) call.   Eula Fried, BSW, MSW, [employer] Managed Medicaid [email or web address] [email or web address] Phone: [phone]

## 2022-07-02 ENCOUNTER — Telehealth: Payer: Self-pay | Admitting: Licensed Clinical Social Worker

## 2022-07-02 NOTE — Transitions of Care (Post Inpatient/ED Visit) (Signed)
   07/02/2022  Name: Nancy Stout MRN: [id number] DOB: [birth year]  Today's TOC FU Call Status: Today's TOC FU Call Status:: Unsuccessful Call (2nd Attempt) Unsuccessful Call (2nd Attempt) Date: 07/02/22  Attempted to reach the patient regarding the most recent Inpatient/ED visit.  Follow Up Plan: No further outreach attempts will be made at this time. We have been unable to contact the patient.  Eula Fried, BSW, MSW, [employer] [email or web address] Phone: [phone]

## 2022-07-08 ENCOUNTER — Other Ambulatory Visit: Payer: Self-pay | Admitting: Family Medicine

## 2022-07-08 MED ORDER — DICLOFENAC SODIUM 75 MG PO TBEC: 75.0000 mg | DELAYED_RELEASE_TABLET | Freq: Two times a day (BID) | ORAL | 0 refills | Status: DC

## 2022-07-08 NOTE — Telephone Encounter (Signed)
Requested medication (s) are due for refill today - expired Rx  Requested medication (s) are on the active medication list -yes  Future visit scheduled -no  Last refill: 05/17/21 350g 3RF  Notes to clinic: expired Rx  Requested Prescriptions  Pending Prescriptions Disp Refills   diclofenac (VOLTAREN) 75 MG EC tablet [Pharmacy Med Name: DICLOFENAC SOD EC 75 MG TAB] 60 tablet 0    Sig: TAKE 1 TABLET BY MOUTH TWICE A DAY     Analgesics:  NSAIDS Failed - 07/08/2022  2:14 AM      Failed - Manual Review: Labs are only required if the patient has taken medication for more than 8 weeks.      Failed - HGB in normal range and within 360 days    Hemoglobin  Date Value Ref Range Status  06/29/2022 17.0 (H) 12.0 - 15.0 g/dL Final  04/21/2022 17.6 (H) 11.1 - 15.9 g/dL Final         Failed - HCT in normal range and within 360 days    HCT  Date Value Ref Range Status  06/29/2022 50.6 (H) 36.0 - 46.0 % Final   Hematocrit  Date Value Ref Range Status  04/21/2022 51.7 (H) 34.0 - 46.6 % Final         Passed - Cr in normal range and within 360 days    Creatinine, Ser  Date Value Ref Range Status  06/29/2022 0.77 0.44 - 1.00 mg/dL Final         Passed - PLT in normal range and within 360 days    Platelets  Date Value Ref Range Status  06/29/2022 265 150 - 400 K/uL Final  04/21/2022 292 150 - 450 x10E3/uL Final         Passed - eGFR is 30 or above and within 360 days    GFR calc Af Amer  Date Value Ref Range Status  12/21/2019 123 >59 mL/min/1.73 Final    Comment:    **[facility] currently reports eGFR in compliance with the current**   recommendations of the [employer]. [facility] will   update reporting as new guidelines are published from the NKF-ASN   Task force.    GFR, Estimated  Date Value Ref Range Status  06/29/2022 >60 >60 mL/min Final    Comment:    (NOTE) Calculated using the CKD-EPI Creatinine Equation (2021)    eGFR  Date Value Ref Range Status   04/21/2022 103 >59 mL/min/1.73 Final         Passed - Patient is not pregnant      Passed - Valid encounter within last 12 months    Recent Outpatient Visits           2 months ago Benign hypertensive renal disease   [place], Megan P, DO   9 months ago Chronic pain of both knees   [place], Megan P, DO   9 months ago [place] Vigg, Avanti, MD   1 year ago Primary osteoarthritis involving multiple joints   [place], [place], DO   1 year ago Mixed hyperlipidemia   [place] [facility] [place] [facility], [place] P, DO                 Requested Prescriptions  Pending Prescriptions Disp Refills   diclofenac (VOLTAREN) 75 MG EC tablet [Pharmacy Med Name: DICLOFENAC SOD EC 75 MG  TAB] 60 tablet 0    Sig: TAKE 1 TABLET BY MOUTH TWICE A DAY     Analgesics:  NSAIDS Failed - 07/08/2022  2:14 AM      Failed - Manual Review: Labs are only required if the patient has taken medication for more than 8 weeks.      Failed - HGB in normal range and within 360 days    Hemoglobin  Date Value Ref Range Status  06/29/2022 17.0 (H) 12.0 - 15.0 g/dL Final  04/21/2022 17.6 (H) 11.1 - 15.9 g/dL Final         Failed - HCT in normal range and within 360 days    HCT  Date Value Ref Range Status  06/29/2022 50.6 (H) 36.0 - 46.0 % Final   Hematocrit  Date Value Ref Range Status  04/21/2022 51.7 (H) 34.0 - 46.6 % Final         Passed - Cr in normal range and within 360 days    Creatinine, Ser  Date Value Ref Range Status  06/29/2022 0.77 0.44 - 1.00 mg/dL Final         Passed - PLT in normal range and within 360 days    Platelets  Date Value Ref Range Status  06/29/2022 265 150 - 400 K/uL Final  04/21/2022 292 150 - 450 x10E3/uL Final         Passed - eGFR is 30 or above and within 360 days    GFR calc Af Amer  Date Value Ref Range  Status  12/21/2019 123 >59 mL/min/1.73 Final    Comment:    **[facility] currently reports eGFR in compliance with the current**   recommendations of the [employer]. [facility] will   update reporting as new guidelines are published from the NKF-ASN   Task force.    GFR, Estimated  Date Value Ref Range Status  06/29/2022 >60 >60 mL/min Final    Comment:    (NOTE) Calculated using the CKD-EPI Creatinine Equation (2021)    eGFR  Date Value Ref Range Status  04/21/2022 103 >59 mL/min/1.73 Final         Passed - Patient is not pregnant      Passed - Valid encounter within last 12 months    Recent Outpatient Visits           2 months ago Benign hypertensive renal disease   [place], Megan P, DO   9 months ago Chronic pain of both knees   [place], Megan P, DO   9 months ago [place] Vigg, Avanti, MD   1 year ago Primary osteoarthritis involving multiple joints   [place], [place], DO   1 year ago Mixed hyperlipidemia   [place], [place], DO

## 2022-07-08 NOTE — Addendum Note (Signed)
Addended by: Valli Glance F on: 07/08/2022 02:57 PM   Modules accepted: Orders

## 2022-07-08 NOTE — Telephone Encounter (Signed)
Sorry- expired Rx is gel- request for tablet

## 2022-07-08 NOTE — Telephone Encounter (Signed)
Requested Prescriptions  Pending Prescriptions Disp Refills   diclofenac (VOLTAREN) 75 MG EC tablet 60 tablet 0    Sig: Take 1 tablet (75 mg total) by mouth 2 (two) times daily.     Analgesics:  NSAIDS Failed - 07/08/2022  2:57 PM      Failed - Manual Review: Labs are only required if the patient has taken medication for more than 8 weeks.      Failed - HGB in normal range and within 360 days    Hemoglobin  Date Value Ref Range Status  06/29/2022 17.0 (H) 12.0 - 15.0 g/dL Final  04/21/2022 17.6 (H) 11.1 - 15.9 g/dL Final         Failed - HCT in normal range and within 360 days    HCT  Date Value Ref Range Status  06/29/2022 50.6 (H) 36.0 - 46.0 % Final   Hematocrit  Date Value Ref Range Status  04/21/2022 51.7 (H) 34.0 - 46.6 % Final         Passed - Cr in normal range and within 360 days    Creatinine, Ser  Date Value Ref Range Status  06/29/2022 0.77 0.44 - 1.00 mg/dL Final         Passed - PLT in normal range and within 360 days    Platelets  Date Value Ref Range Status  06/29/2022 265 150 - 400 K/uL Final  04/21/2022 292 150 - 450 x10E3/uL Final         Passed - eGFR is 30 or above and within 360 days    GFR calc Af Amer  Date Value Ref Range Status  12/21/2019 123 >59 mL/min/1.73 Final    Comment:    **[facility] currently reports eGFR in compliance with the current**   recommendations of the [employer]. [facility] will   update reporting as new guidelines are published from the NKF-ASN   Task force.    GFR, Estimated  Date Value Ref Range Status  06/29/2022 >60 >60 mL/min Final    Comment:    (NOTE) Calculated using the CKD-EPI Creatinine Equation (2021)    eGFR  Date Value Ref Range Status  04/21/2022 103 >59 mL/min/1.73 Final         Passed - Patient is not pregnant      Passed - Valid encounter within last 12 months    Recent Outpatient Visits           2 months ago Benign hypertensive renal disease   [place], Megan P, DO   9 months ago Chronic pain of both knees   [place], Megan P, DO   9 months ago [place] Vigg, Avanti, MD   1 year ago Primary osteoarthritis involving multiple joints   [place], [place], DO   1 year ago Mixed hyperlipidemia   [facility] [place], Megan P, DO              Refused Prescriptions Disp Refills   diclofenac (VOLTAREN) 75 MG EC tablet [Pharmacy Med Name: DICLOFENAC SOD EC 75 MG TAB] 60 tablet 0    Sig: TAKE 1 TABLET BY MOUTH TWICE A DAY     Analgesics:  NSAIDS Failed - 07/08/2022  2:57 PM      Failed - Manual Review: Labs are only required if the patient has taken medication for more than  8 weeks.      Failed - HGB in normal range and within 360 days    Hemoglobin  Date Value Ref Range Status  06/29/2022 17.0 (H) 12.0 - 15.0 g/dL Final  04/21/2022 17.6 (H) 11.1 - 15.9 g/dL Final         Failed - HCT in normal range and within 360 days    HCT  Date Value Ref Range Status  06/29/2022 50.6 (H) 36.0 - 46.0 % Final   Hematocrit  Date Value Ref Range Status  04/21/2022 51.7 (H) 34.0 - 46.6 % Final         Passed - Cr in normal range and within 360 days    Creatinine, Ser  Date Value Ref Range Status  06/29/2022 0.77 0.44 - 1.00 mg/dL Final         Passed - PLT in normal range and within 360 days    Platelets  Date Value Ref Range Status  06/29/2022 265 150 - 400 K/uL Final  04/21/2022 292 150 - 450 x10E3/uL Final         Passed - eGFR is 30 or above and within 360 days    GFR calc Af Amer  Date Value Ref Range Status  12/21/2019 123 >59 mL/min/1.73 Final    Comment:    **[facility] currently reports eGFR in compliance with the current**   recommendations of the [employer]. [facility] will   update reporting as new guidelines are published from the NKF-ASN   Task force.     GFR, Estimated  Date Value Ref Range Status  06/29/2022 >60 >60 mL/min Final    Comment:    (NOTE) Calculated using the CKD-EPI Creatinine Equation (2021)    eGFR  Date Value Ref Range Status  04/21/2022 103 >59 mL/min/1.73 Final         Passed - Patient is not pregnant      Passed - Valid encounter within last 12 months    Recent Outpatient Visits           2 months ago Benign hypertensive renal disease   [place], Megan P, DO   9 months ago Chronic pain of both knees   [place], Megan P, DO   9 months ago [place] Vigg, Avanti, MD   1 year ago Primary osteoarthritis involving multiple joints   [place], [place], DO   1 year ago Mixed hyperlipidemia   [place], [place], DO

## 2022-07-17 ENCOUNTER — Other Ambulatory Visit: Payer: Self-pay | Admitting: Family Medicine

## 2022-07-17 NOTE — Telephone Encounter (Signed)
Requested Prescriptions  Pending Prescriptions Disp Refills   VENTOLIN HFA 108 (90 Base) MCG/ACT inhaler [Pharmacy Med Name: VENTOLIN HFA 90 MCG INHALER] 18 each 3    Sig: TAKE 2 PUFFS BY MOUTH EVERY 4 HOURS AS NEEDED     Pulmonology:  Beta Agonists 2 Failed - 07/17/2022  8:56 AM      Failed - Last BP in normal range    BP Readings from Last 1 Encounters:  06/29/22 (!) 142/82         Passed - Last Heart Rate in normal range    Pulse Readings from Last 1 Encounters:  06/29/22 91         Passed - Valid encounter within last 12 months    Recent Outpatient Visits           2 months ago Benign hypertensive renal disease   [place] [facility] [place], Megan P, DO   9 months ago Chronic pain of both knees   [place] [facility] [place], Megan P, DO   9 months ago Swelling   [place] [facility] Vigg, Avanti, MD   1 year ago Primary osteoarthritis involving multiple joints   [facility] [place], [place], DO   1 year ago Mixed hyperlipidemia    [facility] [place], [place], DO

## 2022-08-07 ENCOUNTER — Other Ambulatory Visit (HOSPITAL_COMMUNITY)
Admission: RE | Admit: 2022-08-07 | Discharge: 2022-08-07 | Disposition: A | Discharge status: Home / Self Care | Payer: Medicaid Other | Source: Ambulatory Visit | Attending: Family Medicine | Admitting: Family Medicine

## 2022-08-07 ENCOUNTER — Encounter: Payer: Self-pay | Admitting: Family Medicine

## 2022-08-07 ENCOUNTER — Other Ambulatory Visit: Payer: Self-pay | Admitting: Family Medicine

## 2022-08-07 ENCOUNTER — Ambulatory Visit: Payer: Medicaid Other | Admitting: Family Medicine

## 2022-08-07 VITALS — BP 132/79 | HR 79 | Temp 99.0°F | Ht 60.0 in | Wt 249.5 lb

## 2022-08-07 DIAGNOSIS — K219 Gastro-esophageal reflux disease without esophagitis: Secondary | ICD-10-CM

## 2022-08-07 DIAGNOSIS — Z136 Encounter for screening for cardiovascular disorders: Secondary | ICD-10-CM | POA: Diagnosis not present

## 2022-08-07 DIAGNOSIS — H538 Other visual disturbances: Secondary | ICD-10-CM | POA: Diagnosis not present

## 2022-08-07 DIAGNOSIS — Z0000 Encounter for general adult medical examination without abnormal findings: Secondary | ICD-10-CM | POA: Insufficient documentation

## 2022-08-07 LAB — URINALYSIS, ROUTINE W REFLEX MICROSCOPIC
Bilirubin, UA: NEGATIVE
Glucose, UA: NEGATIVE
Ketones, UA: NEGATIVE
Leukocytes,UA: NEGATIVE
Nitrite, UA: NEGATIVE
Protein,UA: NEGATIVE
RBC, UA: NEGATIVE
Specific Gravity, UA: 1.02 (ref 1.005–1.030)
Urobilinogen, Ur: 1 mg/dL (ref 0.2–1.0)
pH, UA: 6 (ref 5.0–7.5)

## 2022-08-07 LAB — MICROALBUMIN, URINE WAIVED
Creatinine, Urine Waived: 200 mg/dL (ref 10–300)
Microalb, Ur Waived: 30 mg/L — ABNORMAL HIGH (ref 0–19)
Microalb/Creat Ratio: <30 mg/g (ref <30)

## 2022-08-07 NOTE — Telephone Encounter (Signed)
Requested medication (s) are due for refill today: yes  Requested medication (s) are on the active medication list: yes  Last refill:  05/15/22  Future visit scheduled: yes  Notes to clinic:  Pharmacy comment: Alternative Requested:FOR INSURANCE TO COVER, NEEDS A PRIOR AUTHORIZATION, DUE TO CONCOMITANT THERAPY WITH OMEPRAZOLE OVER 60 DAYS.      Requested Prescriptions  Pending Prescriptions Disp Refills   famotidine (PEPCID) 20 MG tablet [Pharmacy Med Name: FAMOTIDINE 20 MG TABLET] 180 tablet 1    Sig: TAKE 1 TABLET BY MOUTH TWICE A DAY     Gastroenterology:  H2 Antagonists Passed - 08/07/2022  9:27 AM      Passed - Valid encounter within last 12 months    Recent Outpatient Visits           Today Routine general medical examination at a health care facility   [facility], [place] P, DO   3 months ago Benign hypertensive renal disease   [place] [facility] [place], Megan P, DO   10 months ago Chronic pain of both knees   [place] [facility] [place], Megan P, DO   10 months ago Swelling   [place] [facility] Vigg, Avanti, MD   1 year ago Primary osteoarthritis involving multiple joints   [place] [facility] [place], [place], DO       Future Appointments             In 3 months Laural Benes, Oralia Rud, DO [facility]

## 2022-08-07 NOTE — Progress Notes (Signed)
BP 132/79 (BP Location: Left Arm, Cuff Size: Normal)   Pulse 79   Temp 99 F (37.2 C) (Oral)   Ht 5' (1.524 m)   Wt 249 lb 8 oz (113.2 kg)   LMP 08/02/2017 (Exact Date)   SpO2 97%   BMI 48.73 kg/m    Subjective:    Patient ID: Nancy Stout, female    DOB: 1965/06/01, 57 y.o.   MRN: [id number]  HPI: Nancy Stout is a 57 y.o. female presenting on 08/07/2022 for comprehensive medical examination. Current medical complaints include:  ER FOLLOW UP- Feeling a lot better when she was taking both pepcid and prilosec.  Time since discharge: About 5 weeks Hospital/facility: [facility] Diagnosis: Non-specific chest pain Procedures/tests: Labs and EKG Consultants: None New medications: None Discharge instructions: Follow up here  Status: better  Menopausal Symptoms: no  Depression Screen done today and results listed below:     08/07/2022   10:11 AM 04/21/2022    9:27 AM 09/25/2021   10:29 AM 02/25/2021    9:11 AM 09/11/2020   10:21 AM  Depression screen PHQ 2/9  Decreased Interest 0 0 0 0 0  Down, Depressed, Hopeless 0 0 0 0 0  PHQ - 2 Score 0 0 0 0 0  Altered sleeping 0 0 1 2   Tired, decreased energy 0 1 2 2    Change in appetite 0 0 0 0   Feeling bad or failure about yourself  0 0 0 0   Trouble concentrating 0 0 0 0   Moving slowly or fidgety/restless 0 0 0 0   Suicidal thoughts 0 0 0 0   PHQ-9 Score 0 1 3 4    Difficult doing work/chores Not difficult at all Not difficult at all Not difficult at all      Past Medical History:  Past Medical History:  Diagnosis Date   Asthma    COPD (chronic obstructive pulmonary disease) (HCC)    GERD (gastroesophageal reflux disease)    Hyperlipidemia    Hypertension    Low blood potassium    Due to Stomach Virus   Low serum sodium    Due to stomach virus    Surgical History:  Past Surgical History:  Procedure Laterality Date   CESAREAN SECTION     INCISION AND DRAINAGE ABSCESS Left    leg   TONSILLECTOMY       Medications:  Current Outpatient Medications on File Prior to Visit  Medication Sig   albuterol (PROVENTIL) (2.5 MG/3ML) 0.083% nebulizer solution Take 3 mLs (2.5 mg total) by nebulization every 4 (four) hours as needed for wheezing or shortness of breath.   budesonide-formoterol (SYMBICORT) 160-4.5 MCG/ACT inhaler Inhale 2 puffs into the lungs 2 (two) times daily.   cetirizine (ZYRTEC) 10 MG tablet Take 1 tablet (10 mg total) by mouth daily.   diclofenac (VOLTAREN) 75 MG EC tablet Take 1 tablet (75 mg total) by mouth 2 (two) times daily.   diclofenac Sodium (VOLTAREN) 1 % GEL Apply 4 g topically 4 (four) times daily.   fluticasone (FLONASE) 50 MCG/ACT nasal spray SPRAY 2 SPRAYS INTO EACH NOSTRIL EVERY DAY   gabapentin (NEURONTIN) 400 MG capsule Take 1 capsule (400 mg total) by mouth 3 (three) times daily.   Homeopathic Products (SIMILASAN STYE EYE RELIEF OP) Apply to eye as needed.   lisinopril (ZESTRIL) 20 MG tablet Take 1 tablet (20 mg total) by mouth daily.   montelukast (SINGULAIR) 10 MG tablet TAKE 1 TABLET BY MOUTH  EVERYDAY AT BEDTIME   Multiple Vitamin (MULTIVITAMIN) tablet Take 1 tablet by mouth daily.   nicotine (NICODERM CQ - DOSED IN MG/24 HOURS) 21 mg/24hr patch APPLY 1 PATCH ONTO THE SKIN EVERY DAY   omeprazole (PRILOSEC) 20 MG capsule Take 1 capsule (20 mg total) by mouth daily.   pravastatin (PRAVACHOL) 80 MG tablet Take 1 tablet (80 mg total) by mouth daily.   VENTOLIN HFA 108 (90 Base) MCG/ACT inhaler TAKE 2 PUFFS BY MOUTH EVERY 4 HOURS AS NEEDED   famotidine (PEPCID) 20 MG tablet TAKE 1 TABLET BY MOUTH TWICE A DAY (Patient not taking: Reported on 06/29/2022)   No current facility-administered medications on file prior to visit.    Allergies:  Allergies  Allergen Reactions   Pravastatin Rash   Sulfa Antibiotics Rash   Meloxicam Other (See Comments)    Headache    Naproxen Swelling    Peripheral edema    Spiriva Respimat [Tiotropium Bromide Monohydrate] Other  (See Comments)    Joint pain    Social History:  Social History   Socioeconomic History   Marital status: Single    Spouse name: Not on file   Number of children: Not on file   Years of education: Not on file   Highest education level: Not on file  Occupational History   Not on file  Tobacco Use   Smoking status: Every Day    Packs/day: 1.00    Years: 30.00    Additional pack years: 0.00    Total pack years: 30.00    Types: Cigarettes   Smokeless tobacco: Never  Vaping Use   Vaping Use: Never used  Substance and Sexual Activity   Alcohol use: Yes    Alcohol/week: 0.0 standard drinks of alcohol    Comment: one or less per day   Drug use: No   Sexual activity: Yes    Birth control/protection: None  Other Topics Concern   Not on file  Social History Narrative   Not on file   Social Determinants of Health   Financial Resource Strain: Not on file  Food Insecurity: Not on file  Transportation Needs: Not on file  Physical Activity: Not on file  Stress: Not on file  Social Connections: Not on file  Intimate Partner Violence: Not on file   Social History   Tobacco Use  Smoking Status Every Day   Packs/day: 1.00   Years: 30.00   Additional pack years: 0.00   Total pack years: 30.00   Types: Cigarettes  Smokeless Tobacco Never   Social History   Substance and Sexual Activity  Alcohol Use Yes   Alcohol/week: 0.0 standard drinks of alcohol   Comment: one or less per day    Family History:  Family History  Problem Relation Age of Onset   Hyperlipidemia Mother    Hypertension Mother    Cancer Mother    Cancer Father        Lung   Hypertension Father    Hyperlipidemia Sister    Breast cancer Sister     Past medical history, surgical history, medications, allergies, family history and social history reviewed with patient today and changes made to appropriate areas of the chart.   Review of Systems  Constitutional: Negative.   HENT: Negative.    Eyes:  Negative.   Respiratory:  Positive for cough, shortness of breath and wheezing. Negative for hemoptysis and sputum production.   Cardiovascular:  Positive for chest pain and leg swelling. Negative for  palpitations, orthopnea, claudication and PND.  Gastrointestinal:  Positive for constipation, diarrhea and heartburn. Negative for abdominal pain, blood in stool, melena, nausea and vomiting.  Skin: Negative.   Endo/Heme/Allergies:  Positive for environmental allergies and polydipsia. Bruises/bleeds easily.   All other ROS negative except what is listed above and in the HPI.      Objective:    BP 132/79 (BP Location: Left Arm, Cuff Size: Normal)   Pulse 79   Temp 99 F (37.2 C) (Oral)   Ht 5' (1.524 m)   Wt 249 lb 8 oz (113.2 kg)   LMP 08/02/2017 (Exact Date)   SpO2 97%   BMI 48.73 kg/m   Wt Readings from Last 3 Encounters:  08/07/22 249 lb 8 oz (113.2 kg)  06/29/22 253 lb (114.8 kg)  04/21/22 250 lb 12.8 oz (113.8 kg)    Physical Exam Vitals and nursing note reviewed.  Constitutional:      General: She is not in acute distress.    Appearance: Normal appearance. She is not ill-appearing, toxic-appearing or diaphoretic.  HENT:     Head: Normocephalic and atraumatic.     Right Ear: Tympanic membrane, ear canal and external ear normal. There is no impacted cerumen.     Left Ear: Tympanic membrane, ear canal and external ear normal. There is no impacted cerumen.     Nose: Nose normal. No congestion or rhinorrhea.     Mouth/Throat:     Mouth: Mucous membranes are moist.     Pharynx: Oropharynx is clear. No oropharyngeal exudate or posterior oropharyngeal erythema.  Eyes:     General: No scleral icterus.       Right eye: No discharge.        Left eye: No discharge.     Extraocular Movements: Extraocular movements intact.     Conjunctiva/sclera: Conjunctivae normal.     Pupils: Pupils are equal, round, and reactive to light.  Neck:     Vascular: No carotid bruit.   Cardiovascular:     Rate and Rhythm: Normal rate and regular rhythm.     Pulses: Normal pulses.     Heart sounds: No murmur heard.    No friction rub. No gallop.  Pulmonary:     Effort: Pulmonary effort is normal. No respiratory distress.     Breath sounds: Normal breath sounds. No stridor. No wheezing, rhonchi or rales.  Chest:     Chest wall: No tenderness.  Abdominal:     General: Abdomen is flat. Bowel sounds are normal. There is no distension.     Palpations: Abdomen is soft. There is no mass.     Tenderness: There is no abdominal tenderness. There is no right CVA tenderness, left CVA tenderness, guarding or rebound.     Hernia: No hernia is present.  Genitourinary:    Comments: Breast exam deferred with shared decision making Musculoskeletal:        General: No swelling, tenderness, deformity or signs of injury.     Cervical back: Normal range of motion and neck supple. No rigidity. No muscular tenderness.     Right lower leg: No edema.     Left lower leg: No edema.  Lymphadenopathy:     Cervical: No cervical adenopathy.  Skin:    General: Skin is warm and dry.     Capillary Refill: Capillary refill takes less than 2 seconds.     Coloration: Skin is not jaundiced or pale.     Findings: No bruising, erythema, lesion  or rash.  Neurological:     General: No focal deficit present.     Mental Status: She is alert and oriented to person, place, and time. Mental status is at baseline.     Cranial Nerves: No cranial nerve deficit.     Sensory: No sensory deficit.     Motor: No weakness.     Coordination: Coordination normal.     Gait: Gait normal.     Deep Tendon Reflexes: Reflexes normal.  Psychiatric:        Mood and Affect: Mood normal.        Behavior: Behavior normal.        Thought Content: Thought content normal.        Judgment: Judgment normal.     Results for orders placed or performed during the hospital encounter of 06/29/22  Basic metabolic panel  Result  Value Ref Range   Sodium 134 (L) 135 - 145 mmol/L   Potassium 4.5 3.5 - 5.1 mmol/L   Chloride 104 98 - 111 mmol/L   CO2 24 22 - 32 mmol/L   Glucose, Bld 109 (H) 70 - 99 mg/dL   BUN 17 6 - 20 mg/dL   Creatinine, Ser 6.04 0.44 - 1.00 mg/dL   Calcium 8.4 (L) 8.9 - 10.3 mg/dL   GFR, Estimated >54 >09 mL/min   Anion gap 6 5 - 15  CBC  Result Value Ref Range   WBC 8.8 4.0 - 10.5 K/uL   RBC 5.19 (H) 3.87 - 5.11 MIL/uL   Hemoglobin 17.0 (H) 12.0 - 15.0 g/dL   HCT 81.1 (H) 91.4 - 78.2 %   MCV 97.5 80.0 - 100.0 fL   MCH 32.8 26.0 - 34.0 pg   MCHC 33.6 30.0 - 36.0 g/dL   RDW 95.6 21.3 - 08.6 %   Platelets 265 150 - 400 K/uL   nRBC 0.0 0.0 - 0.2 %  D-dimer, quantitative  Result Value Ref Range   D-Dimer, Quant 0.42 0.00 - 0.50 ug/mL-FEU  hCG, quantitative, pregnancy  Result Value Ref Range   hCG, Beta Chain, Quant, S 7 (H) <5 mIU/mL  Troponin I (High Sensitivity)  Result Value Ref Range   Troponin I (High Sensitivity) 4 <18 ng/L  Troponin I (High Sensitivity)  Result Value Ref Range   Troponin I (High Sensitivity) 4 <18 ng/L      Assessment & Plan:   Problem List Items Addressed This Visit   None Visit Diagnoses     Routine general medical examination at a health care facility    -  Primary   Vaccines up to date. Pap done today. Mammo and colonoguard up to date. Continue diet and exercise. Call with any concerns.   Relevant Orders   CBC with Differential/Platelet   Comprehensive metabolic panel   Lipid Panel w/o Chol/HDL Ratio   Cytology - PAP   Urinalysis, Routine w reflex microscopic   TSH   Microalbumin, Urine Waived   Blurred vision       Referral to opthlamology placed today.   Relevant Orders   Ambulatory referral to Ophthalmology   Gastroesophageal reflux disease, unspecified whether esophagitis present       Will continue pepcid and prilosec. Continue to monitor. Call with any concerns.        Follow up plan: Return in about 3 months (around  11/07/2022).   LABORATORY TESTING:  - Pap smear: pap done  IMMUNIZATIONS:   - Tdap: Tetanus vaccination status reviewed: up to date. -  Influenza: Postponed to flu season - Pneumovax: Up to date - COVID: Not applicable - HPV: Not applicable - Shingrix vaccine: Not applicable  SCREENING: -Mammogram: Up to date  - Colonoscopy: Up to date    PATIENT COUNSELING:   Advised to take 1 mg of folate supplement per day if capable of pregnancy.   Sexuality: Discussed sexually transmitted diseases, partner selection, use of condoms, avoidance of unintended pregnancy  and contraceptive alternatives.   Advised to avoid cigarette smoking.  I discussed with the patient that most people either abstain from alcohol or drink within safe limits (<=14/week and <=4 drinks/occasion for males, <=7/weeks and <= 3 drinks/occasion for females) and that the risk for alcohol disorders and other health effects rises proportionally with the number of drinks per week and how often a drinker exceeds daily limits.  Discussed cessation/primary prevention of drug use and availability of treatment for abuse.   Diet: Encouraged to adjust caloric intake to maintain  or achieve ideal body weight, to reduce intake of dietary saturated fat and total fat, to limit sodium intake by avoiding high sodium foods and not adding table salt, and to maintain adequate dietary potassium and calcium preferably from fresh fruits, vegetables, and low-fat dairy products.    stressed the importance of regular exercise  Injury prevention: Discussed safety belts, safety helmets, smoke detector, smoking near bedding or upholstery.   Dental health: Discussed importance of regular tooth brushing, flossing, and dental visits.    NEXT PREVENTATIVE PHYSICAL DUE IN 1 YEAR. Return in about 3 months (around 11/07/2022).

## 2022-08-08 LAB — LIPID PANEL W/O CHOL/HDL RATIO
Cholesterol, Total: 190 mg/dL (ref 100–199)
HDL: 60 mg/dL (ref >39)
LDL Chol Calc (NIH): 107 mg/dL — ABNORMAL HIGH (ref 0–99)
Triglycerides: 131 mg/dL (ref 0–149)
VLDL Cholesterol Cal: 23 mg/dL (ref 5–40)

## 2022-08-08 LAB — COMPREHENSIVE METABOLIC PANEL
ALT: 30 IU/L (ref 0–32)
AST: 20 IU/L (ref 0–40)
Albumin/Globulin Ratio: 2 (ref 1.2–2.2)
Albumin: 4.5 g/dL (ref 3.8–4.9)
Alkaline Phosphatase: 114 IU/L (ref 44–121)
BUN/Creatinine Ratio: 21 (ref 9–23)
BUN: 14 mg/dL (ref 6–24)
Bilirubin Total: 0.4 mg/dL (ref 0.0–1.2)
CO2: 24 mmol/L (ref 20–29)
Calcium: 9.5 mg/dL (ref 8.7–10.2)
Chloride: 101 mmol/L (ref 96–106)
Creatinine, Ser: 0.67 mg/dL (ref 0.57–1.00)
Globulin, Total: 2.3 g/dL (ref 1.5–4.5)
Glucose: 92 mg/dL (ref 70–99)
Potassium: 4.6 mmol/L (ref 3.5–5.2)
Sodium: 139 mmol/L (ref 134–144)
Total Protein: 6.8 g/dL (ref 6.0–8.5)
eGFR: 103 mL/min/{1.73_m2} (ref >59)

## 2022-08-08 LAB — CBC WITH DIFFERENTIAL/PLATELET
Basophils Absolute: 0.1 10*3/uL (ref 0.0–0.2)
Basos: 1 %
EOS (ABSOLUTE): 0.2 10*3/uL (ref 0.0–0.4)
Eos: 3 %
Hematocrit: 49.6 % — ABNORMAL HIGH (ref 34.0–46.6)
Hemoglobin: 16.2 g/dL — ABNORMAL HIGH (ref 11.1–15.9)
Immature Grans (Abs): 0 10*3/uL (ref 0.0–0.1)
Immature Granulocytes: 0 %
Lymphocytes Absolute: 1.9 10*3/uL (ref 0.7–3.1)
Lymphs: 28 %
MCH: 32.4 pg (ref 26.6–33.0)
MCHC: 32.7 g/dL (ref 31.5–35.7)
MCV: 99 fL — ABNORMAL HIGH (ref 79–97)
Monocytes Absolute: 0.7 10*3/uL (ref 0.1–0.9)
Monocytes: 10 %
Neutrophils Absolute: 3.9 10*3/uL (ref 1.4–7.0)
Neutrophils: 58 %
Platelets: 238 10*3/uL (ref 150–450)
RBC: 5 x10E6/uL (ref 3.77–5.28)
RDW: 13.5 % (ref 11.7–15.4)
WBC: 6.7 10*3/uL (ref 3.4–10.8)

## 2022-08-08 LAB — TSH: TSH: 2.43 u[IU]/mL (ref 0.450–4.500)

## 2022-08-11 ENCOUNTER — Other Ambulatory Visit: Payer: Self-pay | Admitting: *Deleted

## 2022-08-11 DIAGNOSIS — Z87891 Personal history of nicotine dependence: Secondary | ICD-10-CM

## 2022-08-11 DIAGNOSIS — Z122 Encounter for screening for malignant neoplasm of respiratory organs: Secondary | ICD-10-CM

## 2022-08-11 DIAGNOSIS — F17210 Nicotine dependence, cigarettes, uncomplicated: Secondary | ICD-10-CM

## 2022-08-12 ENCOUNTER — Other Ambulatory Visit: Payer: Self-pay | Admitting: Family Medicine

## 2022-08-12 LAB — CYTOLOGY - PAP
Adequacy: ABSENT
Comment: NEGATIVE
Diagnosis: NEGATIVE
High risk HPV: NEGATIVE

## 2022-08-12 NOTE — Telephone Encounter (Signed)
Requested medication (s) are due for refill today: yes  Requested medication (s) are on the active medication list: yes  Last refill:  02/25/21 #90 4 RF  Future visit scheduled: yes  Notes to clinic:  please renew or refill rx   Requested Prescriptions  Pending Prescriptions Disp Refills   cetirizine (ZYRTEC) 10 MG tablet [Pharmacy Med Name: CETIRIZINE HCL 10 MG TABLET] 30 tablet     Sig: TAKE 1 TABLET BY MOUTH EVERY DAY     Ear, Nose, and Throat:  Antihistamines 2 Passed - 08/12/2022  9:24 AM      Passed - Cr in normal range and within 360 days    Creatinine, Ser  Date Value Ref Range Status  08/07/2022 0.67 0.57 - 1.00 mg/dL Final         Passed - Valid encounter within last 12 months    Recent Outpatient Visits           5 days ago Routine general medical examination at a health care facility   [facility], [place] P, DO   3 months ago Benign hypertensive renal disease   [place] [facility] [place], Megan P, DO   10 months ago Chronic pain of both knees   [place] [facility] [place], Megan P, DO   10 months ago Swelling   [place] [facility] [place], A[place], MD   1 year ago Primary osteoarthritis involving multiple joints   [place] [facility] [place], [place], DO       Future Appointments             In 3 months [place], Megan P, DO [place] [facility]            Signed Prescriptions Disp Refills   montelukast (SINGULAIR) 10 MG tablet 90 tablet 1    Sig: TAKE 1 TABLET BY MOUTH EVERYDAY AT BEDTIME     Pulmonology:  Leukotriene Inhibitors Passed - 08/12/2022  9:24 AM      Passed - Valid encounter within last 12 months    Recent Outpatient Visits           5 days ago Routine general medical examination at a health care facility   [facility], DO   3 months ago Benign hypertensive renal disease    [place] [facility] [place], Megan P, DO   10 months ago Chronic pain of both knees   [place] [facility] [place], Megan P, DO   10 months ago Swelling   [place] [facility] [place], Avanti, MD   1 year ago Primary osteoarthritis involving multiple joints   [place] [facility] [place], [place], DO       Future Appointments             In 3 months Laural Benes, Oralia Rud, DO [place] [facility], [facility]

## 2022-08-12 NOTE — Telephone Encounter (Signed)
Requested Prescriptions  Pending Prescriptions Disp Refills   cetirizine (ZYRTEC) 10 MG tablet [Pharmacy Med Name: CETIRIZINE HCL 10 MG TABLET] 30 tablet     Sig: TAKE 1 TABLET BY MOUTH EVERY DAY     Ear, Nose, and Throat:  Antihistamines 2 Passed - 08/12/2022  9:24 AM      Passed - Cr in normal range and within 360 days    Creatinine, Ser  Date Value Ref Range Status  08/07/2022 0.67 0.57 - 1.00 mg/dL Final         Passed - Valid encounter within last 12 months    Recent Outpatient Visits           5 days ago Routine general medical examination at a health care facility   [facility], [place] P, DO   3 months ago Benign hypertensive renal disease   [place] [facility] [place], Megan P, DO   10 months ago Chronic pain of both knees   [place] [facility] [place], Megan P, DO   10 months ago Swelling   [place] [facility] V[place], A[place]i, MD   1 year ago Primary osteoarthritis involving multiple joints   [place] [facility] [place], [place], DO       Future Appointments             In 3 months Johnson, Megan P, DO [place] [facility], [facility]             montelukast (SINGULAIR) 10 MG tablet [Pharmacy Med Name: MONTELUKAST SOD 10 MG TABLET] 90 tablet 1    Sig: TAKE 1 TABLET BY MOUTH EVERYDAY AT BEDTIME     Pulmonology:  Leukotriene Inhibitors Passed - 08/12/2022  9:24 AM      Passed - Valid encounter within last 12 months    Recent Outpatient Visits           5 days ago Routine general medical examination at a health care facility   [facility], Megan P, DO   3 months ago Benign hypertensive renal disease   [place] [facility] [place], Megan P, DO   10 months ago Chronic pain of both knees   [place] [facility] [place], Megan P, DO   10 months ago Swelling   [place] [facility]  Vigg, Avanti, MD   1 year ago Primary osteoarthritis involving multiple joints   [place] [facility] [place], [place], DO       Future Appointments             In 3 months Laural Benes, Oralia Rud, DO [place] [facility], [facility]

## 2022-08-26 ENCOUNTER — Encounter: Payer: Medicaid Other | Admitting: Acute Care

## 2022-08-26 ENCOUNTER — Telehealth: Payer: Self-pay | Admitting: Acute Care

## 2022-08-26 NOTE — Telephone Encounter (Signed)
Pt. Was a no show for [facility]. I called x 2, left HIPAA compliant messages on VM . We have cancelled her scan dated for 5/22 and she will need re-scheduling for both CT and [facility].

## 2022-08-27 ENCOUNTER — Ambulatory Visit: Payer: Medicaid Other

## 2022-09-02 ENCOUNTER — Ambulatory Visit: Payer: Self-pay

## 2022-09-02 ENCOUNTER — Ambulatory Visit (INDEPENDENT_AMBULATORY_CARE_PROVIDER_SITE_OTHER): Payer: Medicaid Other | Admitting: Family Medicine

## 2022-09-02 ENCOUNTER — Encounter: Payer: Self-pay | Admitting: Family Medicine

## 2022-09-02 VITALS — BP 125/79 | HR 106 | Temp 98.7°F | Ht 60.0 in | Wt 240.2 lb

## 2022-09-02 DIAGNOSIS — J441 Chronic obstructive pulmonary disease with (acute) exacerbation: Secondary | ICD-10-CM | POA: Diagnosis not present

## 2022-09-02 MED ORDER — PREDNISONE 10 MG PO TABS: ORAL_TABLET | ORAL | 0 refills | Status: DC

## 2022-09-02 NOTE — Telephone Encounter (Signed)
Message from Arther Dames sent at 09/02/2022  1:30 PM EDT  Summary: Medication questions   Patient has questions about possible drug interactions with diclofenac (VOLTAREN) 75 MG EC tablet and predniSONE (DELTASONE) 10 MG tablet. Patient read the drug interactions and wants to know if it is safe to take together.         Chief Complaint: concern of interaction with concurrent use fo diclofenac and prednisone Symptoms: none Pertinent Negatives: Patient denies n/a Disposition: [] ED /[] Urgent Care (no appt availability in office) / [] Appointment(In office/virtual)/ []  [place] [facility]/ [x] Home Care/ [] Refused Recommended Disposition /[] [place] [facility]/ [x]  Follow-up with PCP Additional Notes: looked meds up on Micromedex  and stated increased risk of stomach ulcers and bleeding. Advised wil forward to PCP. Advised to take meds with food or milk in stomach to protect stomach.   Reason for Disposition  Caller has medicine question only, adult not sick, AND triager answers question  Answer Assessment - Initial Assessment Questions 1. NAME of MEDICINE: "What medicine(s) are you calling about?"     Diclofenac and prednisone 2. QUESTION: "What is your question?" (e.g., double dose of medicine, side effect)     Was advised by pharmacist that concurrent use of these 2 meds may cause GI ulcer or bleeding 3. PRESCRIBER: "Who prescribed the medicine?" Reason: if prescribed by specialist, call should be referred to that group.     PCP 4. SYMPTOMS: "Do you have any symptoms?" If Yes, ask: "What symptoms are you having?"  "How bad are the symptoms (e.g., mild, moderate, severe)     none 5. PREGNANCY:  "Is there any chance that you are pregnant?" "When was your last menstrual period?"     N/a  Protocols used: Medication Question Call-A-AH

## 2022-09-02 NOTE — Telephone Encounter (Signed)
Hold the voltaren while she's on the prednisone

## 2022-09-02 NOTE — Progress Notes (Signed)
BP 125/79 (BP Location: Left Arm, Cuff Size: Normal)   Pulse (!) 106   Temp 98.7 F (37.1 C) (Oral)   Ht 5' (1.524 m)   Wt 240 lb 3.2 oz (109 kg)   LMP 08/02/2017 (Exact Date)   SpO2 96%   BMI 46.91 kg/m    Subjective:    Patient ID: Nancy Stout, female    DOB: 19-Oct-1965, 57 y.o.   MRN: [id number]  HPI: Nancy Stout is a 57 y.o. female  Chief Complaint  Patient presents with   Cough    Patient says she woke up Saturday with ear pain, sore throat, and coughing. Patient says she feels better, but she is has the lingering cough and cannot stop. Patient says she when coughs it hurts in her rib cage area and her shoulder blade. Patient says she has been taking OTC DayQuil.    Wheezing   UPPER RESPIRATORY TRACT INFECTION Duration: 2 days Worst symptom: cough Fever: no Cough: yes Shortness of breath: yes Wheezing: yes Chest pain: yes, with cough Chest tightness: no Chest congestion: yes Nasal congestion: yes Runny nose: no Post nasal drip: no Sneezing: no Sore throat: no Swollen glands: no Sinus pressure: no Headache: no Face pain: no Toothache: no Ear pain: no  Ear pressure: no  Eyes red/itching:no Eye drainage/crusting: no  Vomiting: no Rash: no Fatigue: yes Sick contacts: no Strep contacts: no  Context: better Recurrent sinusitis: no Relief with OTC cold/cough medications: no  Treatments attempted: cold/sinus, mucinex, anti-histamine, and pseudoephedrine   Relevant past medical, surgical, family and social history reviewed and updated as indicated. Interim medical history since our last visit reviewed. Allergies and medications reviewed and updated.  Review of Systems  Constitutional: Negative.   HENT: Negative.    Respiratory:  Positive for cough, shortness of breath and wheezing. Negative for apnea, choking, chest tightness and stridor.   Cardiovascular: Negative.   Gastrointestinal: Negative.   Musculoskeletal: Negative.   Neurological:  Negative.   Psychiatric/Behavioral: Negative.      Per HPI unless specifically indicated above     Objective:    BP 125/79 (BP Location: Left Arm, Cuff Size: Normal)   Pulse (!) 106   Temp 98.7 F (37.1 C) (Oral)   Ht 5' (1.524 m)   Wt 240 lb 3.2 oz (109 kg)   LMP 08/02/2017 (Exact Date)   SpO2 96%   BMI 46.91 kg/m   Wt Readings from Last 3 Encounters:  09/02/22 240 lb 3.2 oz (109 kg)  08/07/22 249 lb 8 oz (113.2 kg)  06/29/22 253 lb (114.8 kg)    Physical Exam Vitals and nursing note reviewed.  Constitutional:      General: She is not in acute distress.    Appearance: Normal appearance. She is not ill-appearing, toxic-appearing or diaphoretic.  HENT:     Head: Normocephalic and atraumatic.     Right Ear: Tympanic membrane, ear canal and external ear normal.     Left Ear: Tympanic membrane, ear canal and external ear normal.     Nose: Nose normal. No congestion or rhinorrhea.     Mouth/Throat:     Mouth: Mucous membranes are moist.     Pharynx: Oropharynx is clear. No oropharyngeal exudate or posterior oropharyngeal erythema.  Eyes:     General: No scleral icterus.       Right eye: No discharge.        Left eye: No discharge.     Extraocular Movements: Extraocular movements intact.  Conjunctiva/sclera: Conjunctivae normal.     Pupils: Pupils are equal, round, and reactive to light.  Cardiovascular:     Rate and Rhythm: Normal rate and regular rhythm.     Pulses: Normal pulses.     Heart sounds: Normal heart sounds. No murmur heard.    No friction rub. No gallop.  Pulmonary:     Effort: Pulmonary effort is normal. No respiratory distress.     Breath sounds: No stridor. Wheezing present. No rhonchi or rales.  Chest:     Chest wall: No tenderness.  Musculoskeletal:        General: Normal range of motion.     Cervical back: Normal range of motion and neck supple.  Skin:    General: Skin is warm and dry.     Capillary Refill: Capillary refill takes less than 2  seconds.     Coloration: Skin is not jaundiced or pale.     Findings: No bruising, erythema, lesion or rash.  Neurological:     General: No focal deficit present.     Mental Status: She is alert and oriented to person, place, and time. Mental status is at baseline.  Psychiatric:        Mood and Affect: Mood normal.        Behavior: Behavior normal.        Thought Content: Thought content normal.        Judgment: Judgment normal.     Results for orders placed or performed in visit on 08/07/22  CBC with Differential/Platelet  Result Value Ref Range   WBC 6.7 3.4 - 10.8 x10E3/uL   RBC 5.00 3.77 - 5.28 x10E6/uL   Hemoglobin 16.2 (H) 11.1 - 15.9 g/dL   Hematocrit 25.3 (H) 66.4 - 46.6 %   MCV 99 (H) 79 - 97 fL   MCH 32.4 26.6 - 33.0 pg   MCHC 32.7 31.5 - 35.7 g/dL   RDW 40.3 47.4 - 25.9 %   Platelets 238 150 - 450 x10E3/uL   Neutrophils 58 Not Estab. %   Lymphs 28 Not Estab. %   Monocytes 10 Not Estab. %   Eos 3 Not Estab. %   Basos 1 Not Estab. %   Neutrophils Absolute 3.9 1.4 - 7.0 x10E3/uL   Lymphocytes Absolute 1.9 0.7 - 3.1 x10E3/uL   Monocytes Absolute 0.7 0.1 - 0.9 x10E3/uL   EOS (ABSOLUTE) 0.2 0.0 - 0.4 x10E3/uL   Basophils Absolute 0.1 0.0 - 0.2 x10E3/uL   Immature Granulocytes 0 Not Estab. %   Immature Grans (Abs) 0.0 0.0 - 0.1 x10E3/uL  Comprehensive metabolic panel  Result Value Ref Range   Glucose 92 70 - 99 mg/dL   BUN 14 6 - 24 mg/dL   Creatinine, Ser 5.63 0.57 - 1.00 mg/dL   eGFR 875 >64 PP/IRJ/1.88   BUN/Creatinine Ratio 21 9 - 23   Sodium 139 134 - 144 mmol/L   Potassium 4.6 3.5 - 5.2 mmol/L   Chloride 101 96 - 106 mmol/L   CO2 24 20 - 29 mmol/L   Calcium 9.5 8.7 - 10.2 mg/dL   Total Protein 6.8 6.0 - 8.5 g/dL   Albumin 4.5 3.8 - 4.9 g/dL   Globulin, Total 2.3 1.5 - 4.5 g/dL   Albumin/Globulin Ratio 2.0 1.2 - 2.2   Bilirubin Total 0.4 0.0 - 1.2 mg/dL   Alkaline Phosphatase 114 44 - 121 IU/L   AST 20 0 - 40 IU/L   ALT 30 0 - 32 IU/L  Lipid Panel  w/o Chol/HDL Ratio  Result Value Ref Range   Cholesterol, Total 190 100 - 199 mg/dL   Triglycerides 952 0 - 149 mg/dL   HDL 60 >84 mg/dL   VLDL Cholesterol Cal 23 5 - 40 mg/dL   LDL Chol Calc (NIH) 132 (H) 0 - 99 mg/dL  Urinalysis, Routine w reflex microscopic  Result Value Ref Range   Specific Gravity, UA 1.020 1.005 - 1.030   pH, UA 6.0 5.0 - 7.5   Color, UA Orange Yellow   Appearance Ur Hazy (A) Clear   Leukocytes,UA Negative Negative   Protein,UA Negative Negative/Trace   Glucose, UA Negative Negative   Ketones, UA Negative Negative   RBC, UA Negative Negative   Bilirubin, UA Negative Negative   Urobilinogen, Ur 1.0 0.2 - 1.0 mg/dL   Nitrite, UA Negative Negative   Microscopic Examination Comment   TSH  Result Value Ref Range   TSH 2.430 0.450 - 4.500 uIU/mL  Microalbumin, Urine Waived  Result Value Ref Range   Microalb, Ur Waived 30 (H) 0 - 19 mg/L   Creatinine, Urine Waived 200 10 - 300 mg/dL   Microalb/Creat Ratio <30 <30 mg/g  Cytology - PAP  Result Value Ref Range   High risk HPV Negative    Adequacy      Satisfactory for evaluation; transformation zone component ABSENT.   Diagnosis      - Negative for intraepithelial lesion or malignancy (NILM)   Comment Normal Reference Range HPV - Negative       Assessment & Plan:   Problem List Items Addressed This Visit   None Visit Diagnoses     COPD exacerbation (HCC)    -  Primary   Will check for COVID and treat with steroid taper. Call if not getting better by Friday. Continue to monitor.   Relevant Medications   predniSONE (DELTASONE) 10 MG tablet   Other Relevant Orders   Novel Coronavirus, NAA ([facility])        Follow up plan: Return if symptoms worsen or fail to improve.

## 2022-09-02 NOTE — Telephone Encounter (Signed)
Patient notified and verbalized understanding. Patient advised to give our office a call back if she has any other questions or concerns.

## 2022-09-03 ENCOUNTER — Telehealth: Payer: Self-pay | Admitting: Family Medicine

## 2022-09-03 NOTE — Telephone Encounter (Unsigned)
Copied from CRM [id number]. Topic: General - Other >> Sep 03, 2022  1:02 PM Macon Large wrote: Reason for CRM: Pt called for test results. Pt requests call back.

## 2022-09-03 NOTE — Telephone Encounter (Signed)
Covid test results are not back. If she's asking about the ones from 5/2, she should have gotten those on her mychart.

## 2022-09-04 ENCOUNTER — Ambulatory Visit: Payer: Self-pay | Admitting: *Deleted

## 2022-09-04 LAB — NOVEL CORONAVIRUS, NAA: SARS-CoV-2, NAA: NOT DETECTED

## 2022-09-04 NOTE — Telephone Encounter (Signed)
Left message for patient to give our office to discuss recent lab results.   OK for [facility] to give result note if patient calls back.

## 2022-09-04 NOTE — Telephone Encounter (Signed)
  Chief Complaint: Wanted to know what she could take for pain in her leg while on the prednisone.   She usually uses Voltaren gel and pill but can't take that while on the prednisone. Symptoms: Pain in her leg Frequency: painful since she can't take the Voltaren gel or pill while on the prednisone Pertinent Negatives: Patient denies N/A Disposition: [] ED /[] Urgent Care (no appt availability in office) / [] Appointment(In office/virtual)/ []  [place] [facility]/ [x] Home Care/ [] Refused Recommended Disposition /[] [place] [facility]/ []  Follow-up with PCP Additional Notes: Let her know she could take Tylenol with the Prednisone.   If it wasn't effective to give [place] a call back.   Pt. Was agreeable to this plan.

## 2022-09-04 NOTE — Telephone Encounter (Signed)
Message from [place] sent at 09/04/2022  2:31 PM EDT  Summary: alternate medication   Pt said she has been taking prednisone for 3 days and was advised to stop taking diclofenac (VOLTAREN) 75 MG EC tablet and diclofenac Sodium (VOLTAREN) 1 % GEL but she is still experiencing lerg pain and wants to know what she can take in the place of those meds          Call History   Type Contact Phone/Fax User  09/04/2022 02:28 PM EDT Phone (Incoming) Kira, Gamel (Self) [phone] Rexene Edison) [place] Jester   Reason for Disposition  Caller has medicine question only, adult not sick, AND triager answers question  Answer Assessment - Initial Assessment Questions 1. NAME of MEDICINE: "What medicine(s) are you calling about?"     I'm on Prednisone but can't take Voltaren gel or the pill with the prednisone. 2. QUESTION: "What is your question?" (e.g., double dose of medicine, side effect)     What can I take for the leg pain while on the prednisone?   I let her know she could take Tylenol.   She asked about ibuprofen but I let her know that's in the same class of drug as the Voltaren so not to take ibuprofen.   She verbalized understanding and will try the Tylenol.    She has 9 more days of the prednisone.    I let her know if the Tylenol doesn't help to call us back. 3. PRESCRIBER: "Who prescribed the medicine?" Reason: if prescribed by specialist, call should be referred to that group.     Dr. Laural Benes 4. SYMPTOMS: "Do you have any symptoms?" If Yes, ask: "What symptoms are you having?"  "How bad are the symptoms (e.g., mild, moderate, severe)     Leg pain.   Needing a substitute pain medication to take in place of the Voltaren while on the prednisone. 5. PREGNANCY:  "Is there any chance that you are pregnant?" "When was your last menstrual period?"     N/A due to age  Protocols used: Medication Question Call-A-AH

## 2022-10-15 ENCOUNTER — Other Ambulatory Visit: Payer: Self-pay | Admitting: Family Medicine

## 2022-10-16 NOTE — Telephone Encounter (Signed)
Requested Prescriptions  Pending Prescriptions Disp Refills   VENTOLIN HFA 108 (90 Base) MCG/ACT inhaler [Pharmacy Med Name: VENTOLIN HFA 90 MCG INHALER] 18 each 3    Sig: INHALE 2 PUFFS BY MOUTH EVERY 4 HOURS AS NEEDED     Pulmonology:  Beta Agonists 2 Passed - 10/15/2022  4:25 PM      Passed - Last BP in normal range    BP Readings from Last 1 Encounters:  09/02/22 125/79         Passed - Last Heart Rate in normal range    Pulse Readings from Last 1 Encounters:  09/02/22 (!) 106         Passed - Valid encounter within last 12 months    Recent Outpatient Visits           1 month ago COPD exacerbation (HCC)   [place] [facility] [place], Megan P, DO   2 months ago Routine general medical examination at a health care facility   [facility] Megan [facility], DO   5 months ago Benign hypertensive renal disease   [place] [facility] [place], Megan P, DO   1 year ago Chronic pain of both knees   [place] [facility] [place], [place], DO   1 year ago Swelling   [place] [facility] [place], Roma Schanz, MD       Future Appointments             In 3 weeks Laural Benes, Oralia Rud, DO [place] [facility], [facility]

## 2022-10-26 ENCOUNTER — Other Ambulatory Visit: Payer: Self-pay | Admitting: Family Medicine

## 2022-10-28 NOTE — Telephone Encounter (Signed)
Requested Prescriptions  Pending Prescriptions Disp Refills   diclofenac (VOLTAREN) 75 MG EC tablet [Pharmacy Med Name: DICLOFENAC SOD EC 75 MG TAB] 60 tablet 0    Sig: TAKE 1 TABLET BY MOUTH TWICE A DAY     Analgesics:  NSAIDS Failed - 10/26/2022 11:31 AM      Failed - Manual Review: Labs are only required if the patient has taken medication for more than 8 weeks.      Failed - HGB in normal range and within 360 days    Hemoglobin  Date Value Ref Range Status  08/07/2022 16.2 (H) 11.1 - 15.9 g/dL Final         Failed - HCT in normal range and within 360 days    Hematocrit  Date Value Ref Range Status  08/07/2022 49.6 (H) 34.0 - 46.6 % Final         Passed - Cr in normal range and within 360 days    Creatinine, Ser  Date Value Ref Range Status  08/07/2022 0.67 0.57 - 1.00 mg/dL Final         Passed - PLT in normal range and within 360 days    Platelets  Date Value Ref Range Status  08/07/2022 238 150 - 450 x10E3/uL Final         Passed - eGFR is 30 or above and within 360 days    GFR calc Af Amer  Date Value Ref Range Status  12/21/2019 123 >59 mL/min/1.73 Final    Comment:    **[facility] currently reports eGFR in compliance with the current**   recommendations of the [employer]. [facility] will   update reporting as new guidelines are published from the NKF-ASN   Task force.    GFR, Estimated  Date Value Ref Range Status  06/29/2022 >60 >60 mL/min Final    Comment:    (NOTE) Calculated using the CKD-EPI Creatinine Equation (2021)    eGFR  Date Value Ref Range Status  08/07/2022 103 >59 mL/min/1.73 Final         Passed - Patient is not pregnant      Passed - Valid encounter within last 12 months    Recent Outpatient Visits           1 month ago COPD exacerbation (HCC)   [place] [facility] [place], Megan P, DO   2 months ago Routine general medical examination at a health care facility   [facility], [place] P, DO   6 months ago Benign hypertensive renal disease   [place] [facility] [place], Megan P, DO   1 year ago Chronic pain of both knees   [place] [facility] [place], Megan P, DO   1 year ago Swelling   [place] [facility] V[place], Roma Schanz, MD       Future Appointments             In 1 week Dorcas Carrow, DO  [facility], [facility]

## 2022-11-10 ENCOUNTER — Ambulatory Visit: Payer: Medicaid Other | Admitting: Family Medicine

## 2022-11-18 ENCOUNTER — Ambulatory Visit (INDEPENDENT_AMBULATORY_CARE_PROVIDER_SITE_OTHER): Payer: Medicaid Other | Admitting: Family Medicine

## 2022-11-18 ENCOUNTER — Encounter: Payer: Self-pay | Admitting: Family Medicine

## 2022-11-18 VITALS — BP 134/83 | HR 80 | Temp 98.4°F | Wt 243.6 lb

## 2022-11-18 DIAGNOSIS — J410 Simple chronic bronchitis: Secondary | ICD-10-CM

## 2022-11-18 NOTE — Progress Notes (Unsigned)
BP 134/83   Pulse 80   Temp 98.4 F (36.9 C) (Oral)   Wt 243 lb 9.6 oz (110.5 kg)   LMP 08/02/2017 (Exact Date)   SpO2 99%   BMI 47.57 kg/m    Subjective:    Patient ID: Nancy Stout, female    DOB: 01-21-1966, 57 y.o.   MRN: [id number]  HPI: Nancy Stout is a 57 y.o. female  Chief Complaint  Patient presents with   COPD   COPD COPD status: {Blank single:19197::"controlled","uncontrolled","better","worse","exacerbated","stable"} Satisfied with current treatment?: {Blank single:19197::"yes","no"} Oxygen use: {Blank single:19197::"yes","no"} Dyspnea frequency:  Cough frequency:  Rescue inhaler frequency:   Limitation of activity: {Blank single:19197::"yes","no"} Productive cough:  Last Spirometry:  Pneumovax: {Blank single:19197::"Up to Date","Not up to Date","unknown"} Influenza: {Blank single:19197::"Up to Date","Not up to Date","unknown"}  Relevant past medical, surgical, family and social history reviewed and updated as indicated. Interim medical history since our last visit reviewed. Allergies and medications reviewed and updated.  Review of Systems  Constitutional: Negative.   HENT: Negative.    Respiratory: Negative.    Cardiovascular:  Positive for leg swelling. Negative for chest pain and palpitations.  Gastrointestinal: Negative.   Psychiatric/Behavioral: Negative.      Per HPI unless specifically indicated above     Objective:    BP 134/83   Pulse 80   Temp 98.4 F (36.9 C) (Oral)   Wt 243 lb 9.6 oz (110.5 kg)   LMP 08/02/2017 (Exact Date)   SpO2 99%   BMI 47.57 kg/m   Wt Readings from Last 3 Encounters:  11/18/22 243 lb 9.6 oz (110.5 kg)  09/02/22 240 lb 3.2 oz (109 kg)  08/07/22 249 lb 8 oz (113.2 kg)    Physical Exam Vitals and nursing note reviewed.  Constitutional:      General: She is not in acute distress.    Appearance: Normal appearance. She is obese. She is not ill-appearing, toxic-appearing or diaphoretic.  HENT:      Head: Normocephalic and atraumatic.     Right Ear: External ear normal.     Left Ear: External ear normal.     Nose: Nose normal.     Mouth/Throat:     Mouth: Mucous membranes are moist.     Pharynx: Oropharynx is clear.  Eyes:     General: No scleral icterus.       Right eye: No discharge.        Left eye: No discharge.     Extraocular Movements: Extraocular movements intact.     Conjunctiva/sclera: Conjunctivae normal.     Pupils: Pupils are equal, round, and reactive to light.  Cardiovascular:     Rate and Rhythm: Normal rate and regular rhythm.     Pulses: Normal pulses.     Heart sounds: Normal heart sounds. No murmur heard.    No friction rub. No gallop.  Pulmonary:     Effort: Pulmonary effort is normal. No respiratory distress.     Breath sounds: Normal breath sounds. No stridor. No wheezing, rhonchi or rales.  Chest:     Chest wall: No tenderness.  Musculoskeletal:        General: Normal range of motion.     Cervical back: Normal range of motion and neck supple.  Skin:    General: Skin is warm and dry.     Capillary Refill: Capillary refill takes less than 2 seconds.     Coloration: Skin is not jaundiced or pale.     Findings: No bruising, erythema,  lesion or rash.  Neurological:     General: No focal deficit present.     Mental Status: She is alert and oriented to person, place, and time. Mental status is at baseline.  Psychiatric:        Mood and Affect: Mood normal.        Behavior: Behavior normal.        Thought Content: Thought content normal.        Judgment: Judgment normal.     Results for orders placed or performed in visit on 09/02/22  Novel Coronavirus, NAA ([facility])   Specimen: Nasopharyngeal(NP) swabs in vial transport medium  Result Value Ref Range   SARS-CoV-2, NAA Not Detected Not Detected      Assessment & Plan:   Problem List Items Addressed This Visit   None    Follow up plan: No follow-ups on file.

## 2022-11-19 NOTE — Assessment & Plan Note (Signed)
Lungs clear today. Feeling well. Continue inhalers. Call with any concerns. Continue to monitor.

## 2022-11-24 ENCOUNTER — Other Ambulatory Visit: Payer: Self-pay | Admitting: Family Medicine

## 2022-11-25 NOTE — Telephone Encounter (Signed)
Requested Prescriptions  Pending Prescriptions Disp Refills   diclofenac (VOLTAREN) 75 MG EC tablet [Pharmacy Med Name: DICLOFENAC SOD EC 75 MG TAB] 60 tablet 0    Sig: TAKE 1 TABLET BY MOUTH TWICE A DAY     Analgesics:  NSAIDS Failed - 11/24/2022  1:29 AM      Failed - Manual Review: Labs are only required if the patient has taken medication for more than 8 weeks.      Failed - HGB in normal range and within 360 days    Hemoglobin  Date Value Ref Range Status  08/07/2022 16.2 (H) 11.1 - 15.9 g/dL Final         Failed - HCT in normal range and within 360 days    Hematocrit  Date Value Ref Range Status  08/07/2022 49.6 (H) 34.0 - 46.6 % Final         Passed - Cr in normal range and within 360 days    Creatinine, Ser  Date Value Ref Range Status  08/07/2022 0.67 0.57 - 1.00 mg/dL Final         Passed - PLT in normal range and within 360 days    Platelets  Date Value Ref Range Status  08/07/2022 238 150 - 450 x10E3/uL Final         Passed - eGFR is 30 or above and within 360 days    GFR calc Af Amer  Date Value Ref Range Status  12/21/2019 123 >59 mL/min/1.73 Final    Comment:    **[facility] currently reports eGFR in compliance with the current**   recommendations of the [employer]. [facility] will   update reporting as new guidelines are published from the NKF-ASN   Task force.    GFR, Estimated  Date Value Ref Range Status  06/29/2022 >60 >60 mL/min Final    Comment:    (NOTE) Calculated using the CKD-EPI Creatinine Equation (2021)    eGFR  Date Value Ref Range Status  08/07/2022 103 >59 mL/min/1.73 Final         Passed - Patient is not pregnant      Passed - Valid encounter within last 12 months    Recent Outpatient Visits           1 week ago Simple chronic bronchitis (HCC)   [place] [facility] [place], Megan P, DO   2 months ago COPD exacerbation [facility])   [place] [facility] [place], Megan P, DO   3  months ago Routine general medical examination at a health care facility   [facility] [place] [facility], DO   7 months ago Benign hypertensive renal disease   [place] [facility] [place], Megan P, DO   1 year ago Chronic pain of both knees   [facility] [place], Oralia Rud, DO       Future Appointments             In 2 months Laural Benes, Oralia Rud, DO  [facility]

## 2022-12-24 DIAGNOSIS — H524 Presbyopia: Secondary | ICD-10-CM | POA: Diagnosis not present

## 2023-01-12 ENCOUNTER — Other Ambulatory Visit: Payer: Self-pay | Admitting: Family Medicine

## 2023-01-13 NOTE — Telephone Encounter (Signed)
Labs in date.  Requested Prescriptions  Pending Prescriptions Disp Refills   pravastatin (PRAVACHOL) 80 MG tablet [Pharmacy Med Name: PRAVASTATIN SODIUM 80 MG TAB] 90 tablet 1    Sig: TAKE 1 TABLET BY MOUTH EVERY DAY     Cardiovascular:  Antilipid - Statins Failed - 01/12/2023 12:49 PM      Failed - Lipid Panel in normal range within the last 12 months    Cholesterol, Total  Date Value Ref Range Status  08/07/2022 190 100 - 199 mg/dL Final   Cholesterol Piccolo, Waived  Date Value Ref Range Status  01/19/2015 221 (H) <200 mg/dL Final    Comment:                            Desirable                <200                         Borderline High      200- 239                         High                     >239    LDL Chol Calc (NIH)  Date Value Ref Range Status  08/07/2022 107 (H) 0 - 99 mg/dL Final   HDL  Date Value Ref Range Status  08/07/2022 60 >39 mg/dL Final   Triglycerides  Date Value Ref Range Status  08/07/2022 131 0 - 149 mg/dL Final   Triglycerides Piccolo,Waived  Date Value Ref Range Status  01/19/2015 91 <150 mg/dL Final    Comment:                            Normal                   <150                         Borderline High     150 - 199                         High                200 - 499                         Very High                >499          Passed - Patient is not pregnant      Passed - Valid encounter within last 12 months    Recent Outpatient Visits           1 month ago Simple chronic bronchitis (HCC)   [place] [facility] [place], Megan P, DO   4 months ago COPD exacerbation [facility])   [place] [facility], Megan P, DO   5 months ago Routine general medical examination at a health care facility   [facility] [place] [facility], DO   8 months ago Benign hypertensive renal disease   [facility] [place], [place], DO  1 year ago Chronic pain of  both knees   [place] [facility] [place], Donald, DO       Future Appointments             In 1 month Johnson, Megan P, DO [place] [facility], [facility]             diclofenac (VOLTAREN) 75 MG EC tablet [Pharmacy Med Name: DICLOFENAC SOD EC 75 MG TAB] 60 tablet 0    Sig: TAKE 1 TABLET BY MOUTH TWICE A DAY     Analgesics:  NSAIDS Failed - 01/12/2023 12:49 PM      Failed - Manual Review: Labs are only required if the patient has taken medication for more than 8 weeks.      Failed - HGB in normal range and within 360 days    Hemoglobin  Date Value Ref Range Status  08/07/2022 16.2 (H) 11.1 - 15.9 g/dL Final         Failed - HCT in normal range and within 360 days    Hematocrit  Date Value Ref Range Status  08/07/2022 49.6 (H) 34.0 - 46.6 % Final         Passed - Cr in normal range and within 360 days    Creatinine, Ser  Date Value Ref Range Status  08/07/2022 0.67 0.57 - 1.00 mg/dL Final         Passed - PLT in normal range and within 360 days    Platelets  Date Value Ref Range Status  08/07/2022 238 150 - 450 x10E3/uL Final         Passed - eGFR is 30 or above and within 360 days    GFR calc Af Amer  Date Value Ref Range Status  12/21/2019 123 >59 mL/min/1.73 Final    Comment:    **[facility] currently reports eGFR in compliance with the current**   recommendations of the [employer]. [facility] will   update reporting as new guidelines are published from the NKF-ASN   Task force.    GFR, Estimated  Date Value Ref Range Status  06/29/2022 >60 >60 mL/min Final    Comment:    (NOTE) Calculated using the CKD-EPI Creatinine Equation (2021)    eGFR  Date Value Ref Range Status  08/07/2022 103 >59 mL/min/1.73 Final         Passed - Patient is not pregnant      Passed - Valid encounter within last 12 months    Recent Outpatient Visits           1 month ago Simple chronic bronchitis (HCC)   [place] [facility] [place], Megan P, DO   4 months ago COPD exacerbation [facility]   [place] [facility] [place], Megan P, DO   5 months ago Routine general medical examination at a health care facility   [facility], [place] P, DO   8 months ago Benign hypertensive renal disease   [place] [facility] [place], Megan P, DO   1 year ago Chronic pain of both knees   [place] [place], [place], DO       Future Appointments             In 1 month Johnson, Oralia Rud, DO [place] [facility], [facility]             lisinopril (ZESTRIL) 20 MG tablet [  Pharmacy Med Name: LISINOPRIL 20 MG TABLET] 90 tablet 1    Sig: TAKE 1 TABLET BY MOUTH EVERY DAY     Cardiovascular:  ACE Inhibitors Passed - 01/12/2023 12:49 PM      Passed - Cr in normal range and within 180 days    Creatinine, Ser  Date Value Ref Range Status  08/07/2022 0.67 0.57 - 1.00 mg/dL Final         Passed - K in normal range and within 180 days    Potassium  Date Value Ref Range Status  08/07/2022 4.6 3.5 - 5.2 mmol/L Final         Passed - Patient is not pregnant      Passed - Last BP in normal range    BP Readings from Last 1 Encounters:  11/18/22 134/83         Passed - Valid encounter within last 6 months    Recent Outpatient Visits           1 month ago Simple chronic bronchitis (HCC)   [place] [facility] [place], Megan P, DO   4 months ago COPD exacerbation [facility])   [place] [facility] [place], Megan P, DO   5 months ago Routine general medical examination at a health care facility   [facility], [place] P, DO   8 months ago Benign hypertensive renal disease   [place] [facility] [place], Megan P, DO   1 year ago Chronic pain of both knees   [place] [facility] [place], [place], DO       Future Appointments              In 1 month Johnson, Megan P, DO [place] [facility], [facility]             omeprazole (PRILOSEC) 20 MG capsule [Pharmacy Med Name: OMEPRAZOLE DR 20 MG CAPSULE] 90 capsule 1    Sig: TAKE 1 CAPSULE BY MOUTH EVERY DAY     Gastroenterology: Proton Pump Inhibitors Passed - 01/12/2023 12:49 PM      Passed - Valid encounter within last 12 months    Recent Outpatient Visits           1 month ago Simple chronic bronchitis (HCC)   [place] [facility] [place], Megan P, DO   4 months ago COPD exacerbation [facility])   [place] [facility] [place], Megan P, DO   5 months ago Routine general medical examination at a health care facility   [facility], [place] P, DO   8 months ago Benign hypertensive renal disease   [place] [facility] [place], Megan P, DO   1 year ago Chronic pain of both knees   [place] [facility] [place], [place], DO       Future Appointments             In 1 month Johnson, Oralia Rud, DO [place] [facility], [facility]

## 2023-01-27 ENCOUNTER — Other Ambulatory Visit: Payer: Self-pay | Admitting: Family Medicine

## 2023-01-28 NOTE — Telephone Encounter (Signed)
Requested Prescriptions  Pending Prescriptions Disp Refills   albuterol (VENTOLIN HFA) 108 (90 Base) MCG/ACT inhaler [Pharmacy Med Name: VENTOLIN HFA 90 MCG INHALER] 18 each 0    Sig: TAKE 2 PUFFS BY MOUTH EVERY 4 HOURS AS NEEDED     Pulmonology:  Beta Agonists 2 Passed - 01/27/2023  1:04 PM      Passed - Last BP in normal range    BP Readings from Last 1 Encounters:  11/18/22 134/83         Passed - Last Heart Rate in normal range    Pulse Readings from Last 1 Encounters:  11/18/22 80         Passed - Valid encounter within last 12 months    Recent Outpatient Visits           2 months ago Simple chronic bronchitis (HCC)   [place] [facility] [place], Megan P, DO   4 months ago COPD exacerbation [facility])   [place] [facility] [place], Megan P, DO   5 months ago Routine general medical examination at a health care facility   [facility], [place] P, DO   9 months ago Benign hypertensive renal disease   [place] [facility] [place], Megan P, DO   1 year ago Chronic pain of both knees   [place] [facility] [place], [place], DO       Future Appointments             In 3 weeks Laural Benes, Oralia Rud, DO [facility], [facility]

## 2023-02-04 ENCOUNTER — Other Ambulatory Visit: Payer: Self-pay | Admitting: Family Medicine

## 2023-02-05 NOTE — Telephone Encounter (Signed)
All labs in date.   Refill request for 3 month requested so a 3 month supply given.  Requested Prescriptions  Pending Prescriptions Disp Refills   VENTOLIN HFA 108 (90 Base) MCG/ACT inhaler [Pharmacy Med Name: VENTOLIN HFA 90 MCG INHALER] 18 each 3    Sig: TAKE 2 PUFFS BY MOUTH EVERY 4 HOURS AS NEEDED     Pulmonology:  Beta Agonists 2 Passed - 02/04/2023 12:45 PM      Passed - Last BP in normal range    BP Readings from Last 1 Encounters:  11/18/22 134/83         Passed - Last Heart Rate in normal range    Pulse Readings from Last 1 Encounters:  11/18/22 80         Passed - Valid encounter within last 12 months    Recent Outpatient Visits           2 months ago Simple chronic bronchitis (HCC)   [facility] [place], Megan P, DO   5 months ago COPD exacerbation [facility])   [place] [facility] [place], Megan P, DO   6 months ago Routine general medical examination at a health care facility   [facility], [place] [facility], DO   9 months ago Benign hypertensive renal disease   [place] [facility] [place], Megan P, DO   1 year ago Chronic pain of both knees   [place] [facility] [place], Oralia Rud, DO       Future Appointments             In 1 week Laural Benes, Oralia Rud, DO [place] [facility], [facility]

## 2023-02-08 ENCOUNTER — Other Ambulatory Visit: Payer: Self-pay | Admitting: Family Medicine

## 2023-02-09 NOTE — Telephone Encounter (Signed)
Change of pharmacy Requested Prescriptions  Pending Prescriptions Disp Refills   VENTOLIN HFA 108 (90 Base) MCG/ACT inhaler [Pharmacy Med Name: VENTOLIN HFA 90 MCG INHALER] 18 each 3    Sig: TAKE 2 PUFFS BY MOUTH EVERY 4 HOURS AS NEEDED     Pulmonology:  Beta Agonists 2 Passed - 02/08/2023  9:20 AM      Passed - Last BP in normal range    BP Readings from Last 1 Encounters:  11/18/22 134/83         Passed - Last Heart Rate in normal range    Pulse Readings from Last 1 Encounters:  11/18/22 80         Passed - Valid encounter within last 12 months    Recent Outpatient Visits           2 months ago Simple chronic bronchitis (HCC)   [place] [facility] [place], Megan P, DO   5 months ago COPD exacerbation [facility])   [place] [facility] [place], Megan P, DO   6 months ago Routine general medical examination at a health care facility   [facility] [place] [facility], [place], DO   9 months ago Benign hypertensive renal disease   [place] [facility] [place], Megan P, DO   1 year ago Chronic pain of both knees   [place] [facility] [place], Oralia Rud, DO       Future Appointments             In 1 week Laural Benes, Oralia Rud, DO [place] [facility], [facility]

## 2023-02-15 ENCOUNTER — Other Ambulatory Visit: Payer: Self-pay | Admitting: Family Medicine

## 2023-02-17 NOTE — Telephone Encounter (Signed)
Request is too soon for refill, last refill 01/13/23 for 90 days.  Requested Prescriptions  Pending Prescriptions Disp Refills   pravastatin (PRAVACHOL) 80 MG tablet [Pharmacy Med Name: PRAVASTATIN SODIUM 80 MG TAB] 90 tablet 0    Sig: TAKE 1 TABLET BY MOUTH EVERY DAY     Cardiovascular:  Antilipid - Statins Failed - 02/15/2023 10:46 AM      Failed - Lipid Panel in normal range within the last 12 months    Cholesterol, Total  Date Value Ref Range Status  08/07/2022 190 100 - 199 mg/dL Final   Cholesterol Piccolo, Waived  Date Value Ref Range Status  01/19/2015 221 (H) <200 mg/dL Final    Comment:                            Desirable                <200                         Borderline High      200- 239                         High                     >239    LDL Chol Calc (NIH)  Date Value Ref Range Status  08/07/2022 107 (H) 0 - 99 mg/dL Final   HDL  Date Value Ref Range Status  08/07/2022 60 >39 mg/dL Final   Triglycerides  Date Value Ref Range Status  08/07/2022 131 0 - 149 mg/dL Final   Triglycerides Piccolo,Waived  Date Value Ref Range Status  01/19/2015 91 <150 mg/dL Final    Comment:                            Normal                   <150                         Borderline High     150 - 199                         High                200 - 499                         Very High                >499          Passed - Patient is not pregnant      Passed - Valid encounter within last 12 months    Recent Outpatient Visits           3 months ago Simple chronic bronchitis (HCC)   [place] [facility] [place], Megan P, DO   5 months ago COPD exacerbation [facility])   [place] [facility] [place], Megan P, DO   6 months ago Routine general medical examination at a health care facility   [facility], [place] P, DO   10 months ago Benign hypertensive renal disease  [place] [facility] [place], [place] P, DO   1 year ago Chronic pain of both knees   [place] [facility] [place], [place], DO       Future Appointments             Tomorrow Dorcas Carrow, DO [place] [facility], [facility]

## 2023-02-18 ENCOUNTER — Encounter: Payer: Self-pay | Admitting: Family Medicine

## 2023-02-18 ENCOUNTER — Ambulatory Visit (INDEPENDENT_AMBULATORY_CARE_PROVIDER_SITE_OTHER): Payer: Medicaid Other | Admitting: Family Medicine

## 2023-02-18 VITALS — BP 141/79 | HR 94 | Ht 60.0 in | Wt 242.2 lb

## 2023-02-18 DIAGNOSIS — I129 Hypertensive chronic kidney disease with stage 1 through stage 4 chronic kidney disease, or unspecified chronic kidney disease: Secondary | ICD-10-CM | POA: Diagnosis not present

## 2023-02-18 DIAGNOSIS — J410 Simple chronic bronchitis: Secondary | ICD-10-CM

## 2023-02-18 DIAGNOSIS — E782 Mixed hyperlipidemia: Secondary | ICD-10-CM

## 2023-02-18 DIAGNOSIS — M150 Primary generalized (osteo)arthritis: Secondary | ICD-10-CM | POA: Diagnosis not present

## 2023-02-18 MED ORDER — ALBUTEROL SULFATE HFA 108 (90 BASE) MCG/ACT IN AERS: 1.0000 | INHALATION_SPRAY | RESPIRATORY_TRACT | 12 refills | Status: DC | PRN

## 2023-02-18 MED ORDER — GABAPENTIN 300 MG PO CAPS: 600.0000 mg | ORAL_CAPSULE | Freq: Three times a day (TID) | ORAL | 1 refills | Status: DC

## 2023-02-18 MED ORDER — FAMOTIDINE 20 MG PO TABS: 20.0000 mg | ORAL_TABLET | Freq: Two times a day (BID) | ORAL | 1 refills | Status: DC

## 2023-02-18 MED ORDER — CETIRIZINE HCL 10 MG PO TABS: 10.0000 mg | ORAL_TABLET | Freq: Every day | ORAL | 3 refills | Status: DC

## 2023-02-18 MED ORDER — LISINOPRIL 20 MG PO TABS: 20.0000 mg | ORAL_TABLET | Freq: Every day | ORAL | 1 refills | Status: DC

## 2023-02-18 MED ORDER — DICLOFENAC SODIUM 75 MG PO TBEC: 75.0000 mg | DELAYED_RELEASE_TABLET | Freq: Two times a day (BID) | ORAL | 1 refills | Status: DC

## 2023-02-18 MED ORDER — BUDESONIDE-FORMOTEROL FUMARATE 160-4.5 MCG/ACT IN AERO: 2.0000 | INHALATION_SPRAY | Freq: Two times a day (BID) | RESPIRATORY_TRACT | 12 refills | Status: DC

## 2023-02-18 MED ORDER — FLUTICASONE PROPIONATE 50 MCG/ACT NA SUSP: NASAL | 6 refills | Status: DC

## 2023-02-18 MED ORDER — MONTELUKAST SODIUM 10 MG PO TABS: ORAL_TABLET | ORAL | 1 refills | Status: DC

## 2023-02-18 MED ORDER — OMEPRAZOLE 20 MG PO CPDR: 20.0000 mg | DELAYED_RELEASE_CAPSULE | Freq: Every day | ORAL | 1 refills | Status: DC

## 2023-02-18 MED ORDER — PRAVASTATIN SODIUM 80 MG PO TABS: 80.0000 mg | ORAL_TABLET | Freq: Every day | ORAL | 1 refills | Status: DC

## 2023-02-18 NOTE — Progress Notes (Signed)
BP (!) 141/79   Pulse 94   Ht 5' (1.524 m)   Wt 242 lb 3.2 oz (109.9 kg)   LMP 08/02/2017 (Exact Date)   SpO2 99%   BMI 47.30 kg/m    Subjective:    Patient ID: Nancy Stout, female    DOB: 11/02/1965, 57 y.o.   MRN: [id number]  HPI: Nancy Stout is a 57 y.o. female  Chief Complaint  Patient presents with   COPD   Joint Pain   HYPERTENSION / HYPERLIPIDEMIA Satisfied with current treatment? yes Duration of hypertension: chronic BP monitoring frequency: rarely BP medication side effects: no Past BP meds: lisinopril Duration of hyperlipidemia: chronic Cholesterol medication side effects: no Cholesterol supplements: none Past cholesterol medications: pravastatin Medication compliance: excellent compliance Aspirin: no Recent stressors: no Recurrent headaches: no Visual changes: no Palpitations: no Dyspnea: no Chest pain: no Lower extremity edema: no Dizzy/lightheaded: no  COPD COPD status: stable Satisfied with current treatment?: yes Oxygen use: no Dyspnea frequency: daily Cough frequency: several times a day Rescue inhaler frequency:  daily Limitation of activity: no Productive cough: no Influenza: Not up to Date   Relevant past medical, surgical, family and social history reviewed and updated as indicated. Interim medical history since our last visit reviewed. Allergies and medications reviewed and updated.  Review of Systems  Constitutional: Negative.   Respiratory:  Positive for cough, chest tightness and shortness of breath. Negative for apnea, choking, wheezing and stridor.   Cardiovascular: Negative.   Gastrointestinal: Negative.   Musculoskeletal: Negative.   Neurological: Negative.   Psychiatric/Behavioral: Negative.      Per HPI unless specifically indicated above     Objective:    BP (!) 141/79   Pulse 94   Ht 5' (1.524 m)   Wt 242 lb 3.2 oz (109.9 kg)   LMP 08/02/2017 (Exact Date)   SpO2 99%   BMI 47.30 kg/m   Wt  Readings from Last 3 Encounters:  02/18/23 242 lb 3.2 oz (109.9 kg)  11/18/22 243 lb 9.6 oz (110.5 kg)  09/02/22 240 lb 3.2 oz (109 kg)    Physical Exam Vitals and nursing note reviewed.  Constitutional:      General: She is not in acute distress.    Appearance: Normal appearance. She is not ill-appearing, toxic-appearing or diaphoretic.  HENT:     Head: Normocephalic and atraumatic.     Right Ear: External ear normal.     Left Ear: External ear normal.     Nose: Nose normal.     Mouth/Throat:     Mouth: Mucous membranes are moist.     Pharynx: Oropharynx is clear.  Eyes:     General: No scleral icterus.       Right eye: No discharge.        Left eye: No discharge.     Extraocular Movements: Extraocular movements intact.     Conjunctiva/sclera: Conjunctivae normal.     Pupils: Pupils are equal, round, and reactive to light.  Cardiovascular:     Rate and Rhythm: Normal rate and regular rhythm.     Pulses: Normal pulses.     Heart sounds: Normal heart sounds. No murmur heard.    No friction rub. No gallop.  Pulmonary:     Effort: Pulmonary effort is normal. No respiratory distress.     Breath sounds: Normal breath sounds. No stridor. No wheezing, rhonchi or rales.  Chest:     Chest wall: No tenderness.  Musculoskeletal:  General: Normal range of motion.     Cervical back: Normal range of motion and neck supple.  Skin:    General: Skin is warm and dry.     Capillary Refill: Capillary refill takes less than 2 seconds.     Coloration: Skin is not jaundiced or pale.     Findings: No bruising, erythema, lesion or rash.  Neurological:     General: No focal deficit present.     Mental Status: She is alert and oriented to person, place, and time. Mental status is at baseline.  Psychiatric:        Mood and Affect: Mood normal.        Behavior: Behavior normal.        Thought Content: Thought content normal.        Judgment: Judgment normal.     Results for orders  placed or performed in visit on 09/02/22  Novel Coronavirus, NAA ([facility])   Specimen: Nasopharyngeal(NP) swabs in vial transport medium  Result Value Ref Range   SARS-CoV-2, NAA Not Detected Not Detected      Assessment & Plan:   Problem List Items Addressed This Visit       Respiratory   COPD (chronic obstructive pulmonary disease) (HCC)    Under good control on current regimen. Continue current regimen. Continue to monitor. Call with any concerns. Refills given.        Relevant Medications   budesonide-formoterol (SYMBICORT) 160-4.5 MCG/ACT inhaler   cetirizine (ZYRTEC) 10 MG tablet   fluticasone (FLONASE) 50 MCG/ACT nasal spray   montelukast (SINGULAIR) 10 MG tablet   albuterol (VENTOLIN HFA) 108 (90 Base) MCG/ACT inhaler   Other Relevant Orders   CBC with Differential/Platelet     Musculoskeletal and Integument   Osteoarthritis    Will increase her gabapentin to 600mg  TID. Call with any concerns.       Relevant Medications   diclofenac (VOLTAREN) 75 MG EC tablet     Genitourinary   Benign hypertensive renal disease - Primary    Running a little high- just took a puff of her epinephrine inhaler. Will monitor at home. Call with any concerns. Continue to monitor.         Other   Hyperlipidemia    Under good control on current regimen. Continue current regimen. Continue to monitor. Call with any concerns. Refills given. Labs drawn today.        Relevant Medications   lisinopril (ZESTRIL) 20 MG tablet   pravastatin (PRAVACHOL) 80 MG tablet   Other Relevant Orders   Comprehensive metabolic panel   Lipid Panel w/o Chol/HDL Ratio     Follow up plan: Return in about 6 months (around 08/18/2023) for physical.

## 2023-02-18 NOTE — Assessment & Plan Note (Signed)
Running a little high- just took a puff of her epinephrine inhaler. Will monitor at home. Call with any concerns. Continue to monitor.

## 2023-02-18 NOTE — Assessment & Plan Note (Signed)
Under good control on current regimen. Continue current regimen. Continue to monitor. Call with any concerns. Refills given.   

## 2023-02-18 NOTE — Assessment & Plan Note (Signed)
Will increase her gabapentin to 600mg  TID. Call with any concerns.

## 2023-02-18 NOTE — Assessment & Plan Note (Signed)
Under good control on current regimen. Continue current regimen. Continue to monitor. Call with any concerns. Refills given. Labs drawn today.   

## 2023-02-19 LAB — LIPID PANEL W/O CHOL/HDL RATIO
Cholesterol, Total: 255 mg/dL — ABNORMAL HIGH (ref 100–199)
HDL: 55 mg/dL (ref >39)
LDL Chol Calc (NIH): 183 mg/dL — ABNORMAL HIGH (ref 0–99)
Triglycerides: 99 mg/dL (ref 0–149)
VLDL Cholesterol Cal: 17 mg/dL (ref 5–40)

## 2023-02-19 LAB — CBC WITH DIFFERENTIAL/PLATELET
Basophils Absolute: 0.1 10*3/uL (ref 0.0–0.2)
Basos: 2 %
EOS (ABSOLUTE): 0.2 10*3/uL (ref 0.0–0.4)
Eos: 3 %
Hematocrit: 52 % — ABNORMAL HIGH (ref 34.0–46.6)
Hemoglobin: 17.4 g/dL — ABNORMAL HIGH (ref 11.1–15.9)
Immature Grans (Abs): 0 10*3/uL (ref 0.0–0.1)
Immature Granulocytes: 1 %
Lymphocytes Absolute: 1.5 10*3/uL (ref 0.7–3.1)
Lymphs: 25 %
MCH: 32.8 pg (ref 26.6–33.0)
MCHC: 33.5 g/dL (ref 31.5–35.7)
MCV: 98 fL — ABNORMAL HIGH (ref 79–97)
Monocytes Absolute: 0.5 10*3/uL (ref 0.1–0.9)
Monocytes: 8 %
Neutrophils Absolute: 3.7 10*3/uL (ref 1.4–7.0)
Neutrophils: 61 %
Platelets: 268 10*3/uL (ref 150–450)
RBC: 5.31 x10E6/uL — ABNORMAL HIGH (ref 3.77–5.28)
RDW: 12.6 % (ref 11.7–15.4)
WBC: 6 10*3/uL (ref 3.4–10.8)

## 2023-02-19 LAB — COMPREHENSIVE METABOLIC PANEL
ALT: 24 [IU]/L (ref 0–32)
AST: 17 [IU]/L (ref 0–40)
Albumin: 4.1 g/dL (ref 3.8–4.9)
Alkaline Phosphatase: 128 [IU]/L — ABNORMAL HIGH (ref 44–121)
BUN/Creatinine Ratio: 16 (ref 9–23)
BUN: 11 mg/dL (ref 6–24)
Bilirubin Total: 0.3 mg/dL (ref 0.0–1.2)
CO2: 23 mmol/L (ref 20–29)
Calcium: 9.7 mg/dL (ref 8.7–10.2)
Chloride: 98 mmol/L (ref 96–106)
Creatinine, Ser: 0.68 mg/dL (ref 0.57–1.00)
Globulin, Total: 3.2 g/dL (ref 1.5–4.5)
Glucose: 106 mg/dL — ABNORMAL HIGH (ref 70–99)
Potassium: 5.8 mmol/L — ABNORMAL HIGH (ref 3.5–5.2)
Sodium: 137 mmol/L (ref 134–144)
Total Protein: 7.3 g/dL (ref 6.0–8.5)
eGFR: 102 mL/min/{1.73_m2} (ref >59)

## 2023-03-23 ENCOUNTER — Other Ambulatory Visit: Payer: Self-pay | Admitting: Family Medicine

## 2023-03-23 NOTE — Telephone Encounter (Signed)
Medication Refill -  Most Recent Primary Care Visit:  Provider: Dorcas Carrow  Department: [facility]  Visit Type: OFFICE VISIT  Date: 02/18/2023  Medication: pravastatin (PRAVACHOL) 80 MG tablet [[id number]]   Has the patient contacted their pharmacy? Yes  (Agent: If yes, when and what did the pharmacy advise?) Contact office for refill  Is this the correct pharmacy for this prescription? Yes If no, delete pharmacy and type the correct one.  This is the patient's preferred pharmacy:  [id number] - [place], [place] - [address] [place] [address] Phone: [phone] Fax: [phone]   Has the prescription been filled recently? Yes  Is the patient out of the medication? Yes  Has the patient been seen for an appointment in the last year OR does the patient have an upcoming appointment? Yes  Can we respond through MyChart? Yes  Agent: Please be advised that Rx refills may take up to 3 business days. We ask that you follow-up with your pharmacy.   Pt is requesting a 30 day supply due to financial reason.

## 2023-03-24 NOTE — Telephone Encounter (Signed)
Pt called back, advised of message from [place], [place]. Pt states she will call pharmacy and call back if they give her any issues.

## 2023-03-24 NOTE — Telephone Encounter (Signed)
Called pt - LMOM. Pt is requesting a 30 days supply. Pt can get a smaller quantity by asking the pharmacy. Pt doe snot need a new rx.

## 2023-03-24 NOTE — Telephone Encounter (Signed)
Refilled 02/18/23. Requested Prescriptions  Refused Prescriptions Disp Refills   pravastatin (PRAVACHOL) 80 MG tablet 90 tablet 1    Sig: Take 1 tablet (80 mg total) by mouth daily.     Cardiovascular:  Antilipid - Statins Failed - 03/24/2023  1:06 PM      Failed - Lipid Panel in normal range within the last 12 months    Cholesterol, Total  Date Value Ref Range Status  02/18/2023 255 (H) 100 - 199 mg/dL Final   Cholesterol Piccolo, Waived  Date Value Ref Range Status  01/19/2015 221 (H) <200 mg/dL Final    Comment:                            Desirable                <200                         Borderline High      200- 239                         High                     >239    LDL Chol Calc (NIH)  Date Value Ref Range Status  02/18/2023 183 (H) 0 - 99 mg/dL Final   HDL  Date Value Ref Range Status  02/18/2023 55 >39 mg/dL Final   Triglycerides  Date Value Ref Range Status  02/18/2023 99 0 - 149 mg/dL Final   Triglycerides Piccolo,Waived  Date Value Ref Range Status  01/19/2015 91 <150 mg/dL Final    Comment:                            Normal                   <150                         Borderline High     150 - 199                         High                200 - 499                         Very High                >499          Passed - Patient is not pregnant      Passed - Valid encounter within last 12 months    Recent Outpatient Visits           1 month ago Benign hypertensive renal disease   [facility] [place], Megan P, DO   4 months ago Simple chronic bronchitis (HCC)   [facility] [place], Megan P, DO   6 months ago COPD exacerbation [facility])   [facility] [place], Megan P, DO   7 months ago Routine general medical examination at a health care facility   [facility] [place] P, DO   11 months ago Benign  hypertensive renal disease   [facility] Dor[facility] [place], DO       Future Appointments             In 2 months Laural Benes, Oralia Rud, DO  [facility]

## 2023-05-10 ENCOUNTER — Other Ambulatory Visit: Payer: Self-pay | Admitting: Family Medicine

## 2023-05-12 NOTE — Telephone Encounter (Signed)
 Requested Prescriptions  Refused Prescriptions Disp Refills   gabapentin  (NEURONTIN ) 400 MG capsule [Pharmacy Med Name: GABAPENTIN  400 MG CAPSULE] 270 capsule 1    Sig: TAKE 1 CAPSULE BY MOUTH 3 TIMES DAILY.     Neurology: Anticonvulsants - gabapentin  Passed - 05/12/2023  9:16 AM      Passed - Cr in normal range and within 360 days    Creatinine, Ser  Date Value Ref Range Status  02/18/2023 0.68 0.57 - 1.00 mg/dL Final         Passed - Completed PHQ-2 or PHQ-9 in the last 360 days      Passed - Valid encounter within last 12 months    Recent Outpatient Visits           2 months ago Benign hypertensive renal disease   [place] [facility] [place], Megan P, DO   5 months ago Simple chronic bronchitis (HCC)   [place] [facility] [place], Megan P, DO   8 months ago COPD exacerbation [facility])   [place] [facility] [place], Megan P, DO   9 months ago Routine general medical examination at a health care facility   [facility] [place], [place] P, DO   1 year ago Benign hypertensive renal disease   [facility] Vicci Duwaine SQUIBB, DO       Future Appointments             In 1 week Vicci, Duwaine SQUIBB, DO [place] [facility]

## 2023-05-25 ENCOUNTER — Encounter: Payer: Medicaid Other | Admitting: Family Medicine

## 2023-06-23 ENCOUNTER — Other Ambulatory Visit: Payer: Self-pay | Admitting: Family Medicine

## 2023-06-24 NOTE — Telephone Encounter (Signed)
 Refills not appropriate, patient should have additions on file. Requested Prescriptions  Pending Prescriptions Disp Refills   VENTOLIN HFA 108 (90 Base) MCG/ACT inhaler [Pharmacy Med Name: VENTOLIN HFA 90 MCG INHALER] 18 each 3    Sig: INHALE 2 PUFFS BY MOUTH EVERY 4 HOURS AS NEEDED     Pulmonology:  Beta Agonists 2 Failed - 06/24/2023  1:02 PM      Failed - Last BP in normal range    BP Readings from Last 1 Encounters:  02/18/23 (!) 141/79         Passed - Last Heart Rate in normal range    Pulse Readings from Last 1 Encounters:  02/18/23 94         Passed - Valid encounter within last 12 months    Recent Outpatient Visits           4 months ago Benign hypertensive renal disease   [facility] [place], Megan P, DO   7 months ago Simple chronic bronchitis (HCC)   [place] [facility] [place], Megan P, DO   9 months ago COPD exacerbation [place])   [facility] [place], Megan P, DO   10 months ago Routine general medical examination at a health care facility   [facility] [place], [place] P, DO   1 year ago Benign hypertensive renal disease   [place] [facility] [place], Megan P, DO

## 2023-06-26 ENCOUNTER — Ambulatory Visit (INDEPENDENT_AMBULATORY_CARE_PROVIDER_SITE_OTHER): Admitting: Family Medicine

## 2023-06-26 DIAGNOSIS — Z538 Procedure and treatment not carried out for other reasons: Secondary | ICD-10-CM

## 2023-06-26 NOTE — Progress Notes (Signed)
 Appointment scheduled earlier than needed. Patient did not need to be seen today. Follow up in May as scheduled

## 2023-07-08 DIAGNOSIS — M1712 Unilateral primary osteoarthritis, left knee: Secondary | ICD-10-CM | POA: Diagnosis not present

## 2023-07-08 DIAGNOSIS — M25562 Pain in left knee: Secondary | ICD-10-CM | POA: Diagnosis not present

## 2023-10-01 ENCOUNTER — Other Ambulatory Visit: Payer: Self-pay | Admitting: Family Medicine

## 2023-10-02 NOTE — Telephone Encounter (Signed)
 OFFICE VISIT NEEDED FOR ADDITIONAL REFILLS   Requested Prescriptions  Pending Prescriptions Disp Refills   pravastatin  (PRAVACHOL ) 80 MG tablet [Pharmacy Med Name: PRAVASTATIN  SODIUM 80 MG TAB] 30 tablet 0    Sig: TAKE 1 TABLET BY MOUTH EVERY DAY     Cardiovascular:  Antilipid - Statins Failed - 10/02/2023  2:11 PM      Failed - Valid encounter within last 12 months    Recent Outpatient Visits           3 months ago Appointment canceled by hospital   [place] [facility] [place], [place] P, DO              Failed - Lipid Panel in normal range within the last 12 months    Cholesterol, Total  Date Value Ref Range Status  02/18/2023 255 (H) 100 - 199 mg/dL Final   Cholesterol Piccolo, Waived  Date Value Ref Range Status  01/19/2015 221 (H) <200 mg/dL Final    Comment:                            Desirable                <200                         Borderline High      200- 239                         High                     >239    LDL Chol Calc (NIH)  Date Value Ref Range Status  02/18/2023 183 (H) 0 - 99 mg/dL Final   HDL  Date Value Ref Range Status  02/18/2023 55 >39 mg/dL Final   Triglycerides  Date Value Ref Range Status  02/18/2023 99 0 - 149 mg/dL Final   Triglycerides Piccolo,Waived  Date Value Ref Range Status  01/19/2015 91 <150 mg/dL Final    Comment:                            Normal                   <150                         Borderline High     150 - 199                         High                200 - 499                         Very High                >499          Passed - Patient is not pregnant       lisinopril  (ZESTRIL ) 20 MG tablet [Pharmacy Med Name: LISINOPRIL  20 MG TABLET] 30 tablet 0    Sig: TAKE 1 TABLET BY MOUTH EVERY DAY     Cardiovascular:  ACE Inhibitors Failed - 10/02/2023  2:11 PM  Failed - Cr in normal range and within 180 days    Creatinine, Ser  Date Value Ref Range Status  02/18/2023 0.68  0.57 - 1.00 mg/dL Final         Failed - K in normal range and within 180 days    Potassium  Date Value Ref Range Status  02/18/2023 5.8 (H) 3.5 - 5.2 mmol/L Final         Failed - Last BP in normal range    BP Readings from Last 1 Encounters:  02/18/23 (!) 141/79         Failed - Valid encounter within last 6 months    Recent Outpatient Visits           3 months ago Appointment canceled by hospital   [facility], [place], DO              Passed - Patient is not pregnant       omeprazole  (PRILOSEC) 20 MG capsule [Pharmacy Med Name: OMEPRAZOLE  DR 20 MG CAPSULE] 30 capsule 0    Sig: TAKE 1 CAPSULE BY MOUTH EVERY DAY     Gastroenterology: Proton Pump Inhibitors Failed - 10/02/2023  2:11 PM      Failed - Valid encounter within last 12 months    Recent Outpatient Visits           3 months ago Appointment canceled by hospital   [facility], [facility] P, DO

## 2023-10-31 IMAGING — MG MM DIGITAL SCREENING BILAT W/ TOMO AND CAD
6 of 10 series · 6 of 30 positions shown · non-contrast
Comparison: Previous exam(s).

CLINICAL DATA: Screening.

EXAM:
DIGITAL SCREENING BILATERAL MAMMOGRAM WITH TOMOSYNTHESIS AND CAD
TECHNIQUE: Bilateral screening digital craniocaudal and mediolateral oblique
mammograms were obtained. Bilateral screening digital breast
tomosynthesis was performed. The images were evaluated with
computer-aided detection.

[R MLO synth-2D]
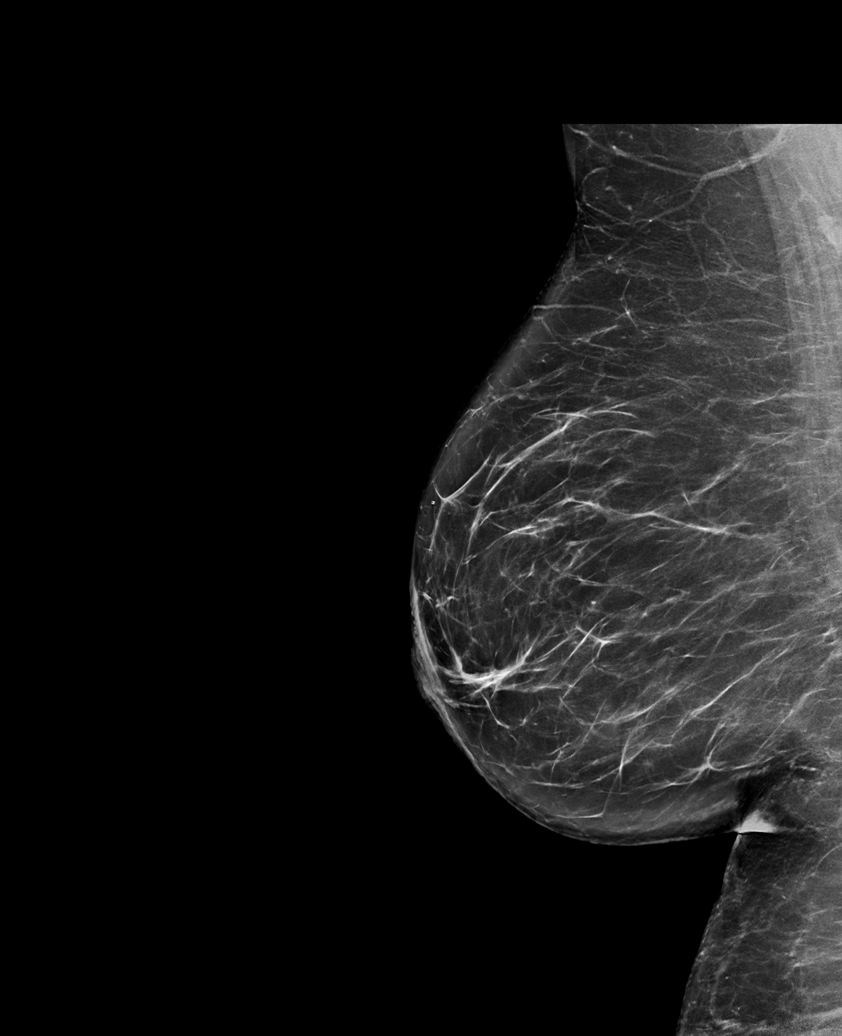

[L CC synth-2D]
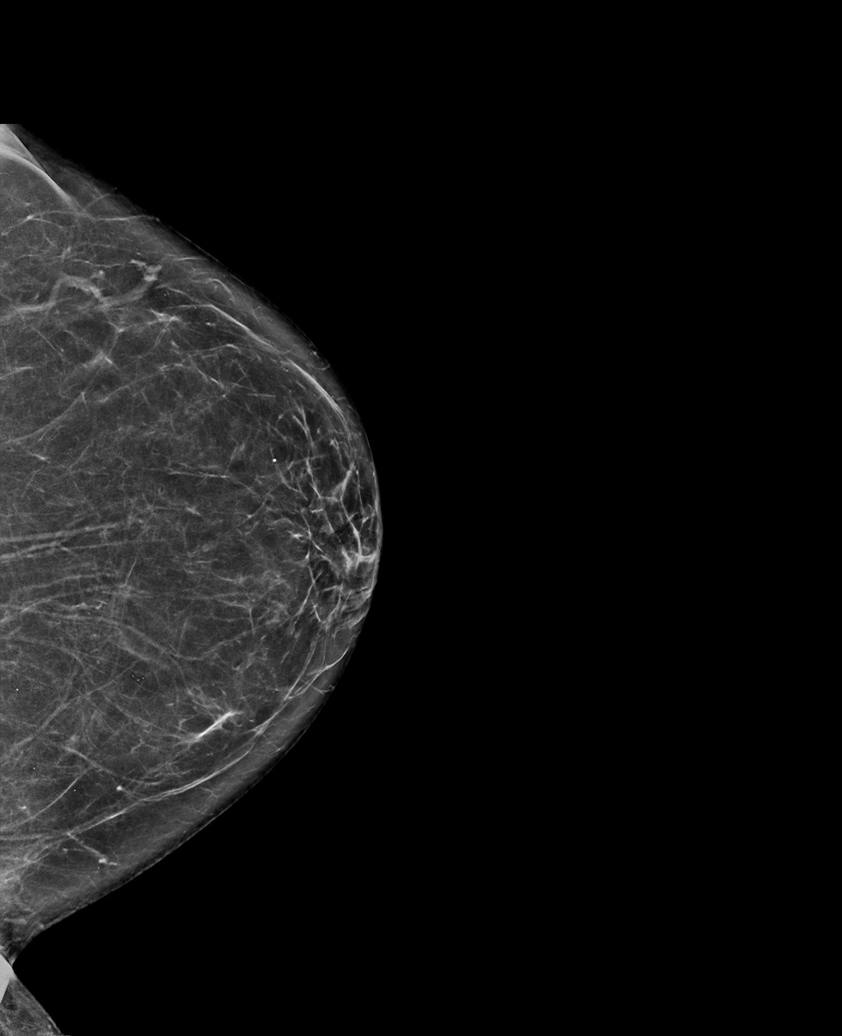

[L MLO synth-2D (1 of 2)]
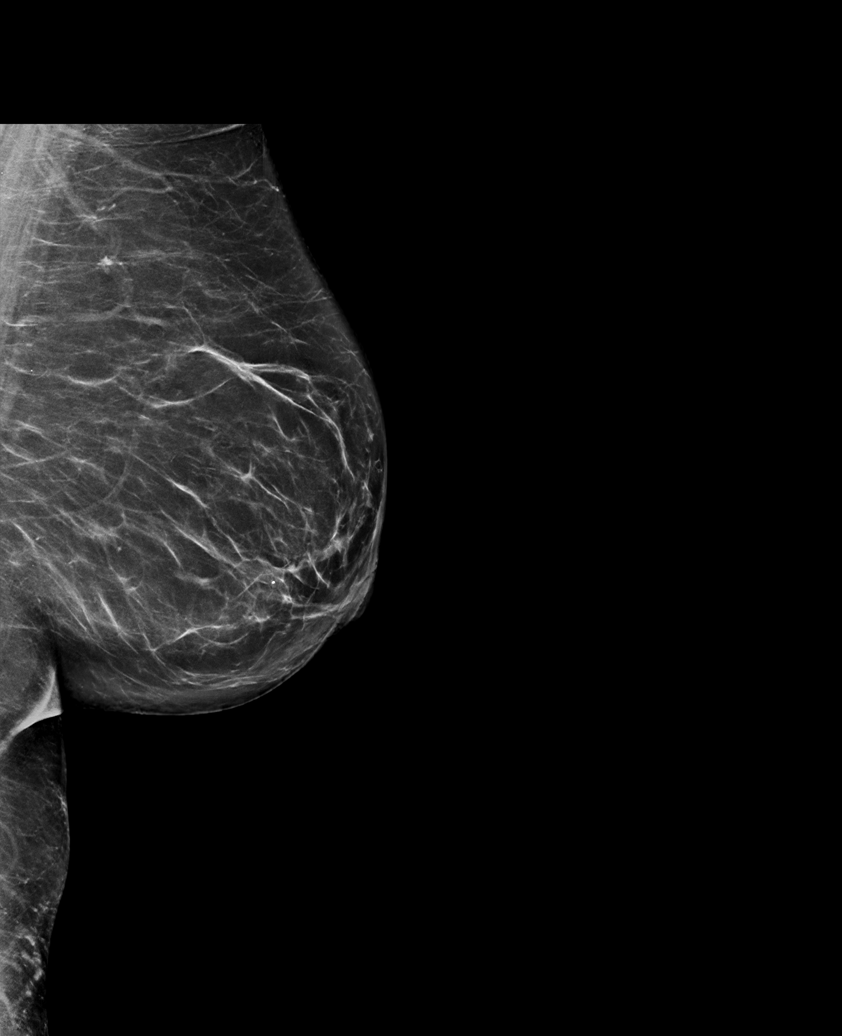

[L MLO synth-2D (2 of 2)]
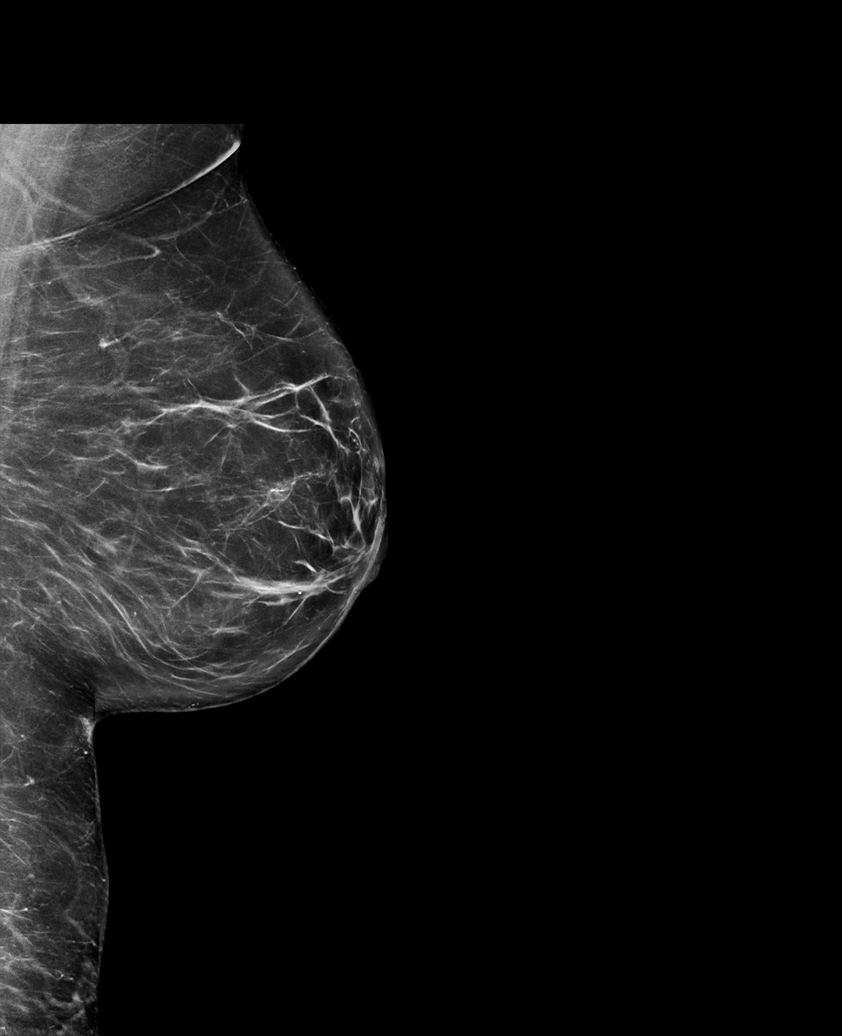

[R CC synth-2D]
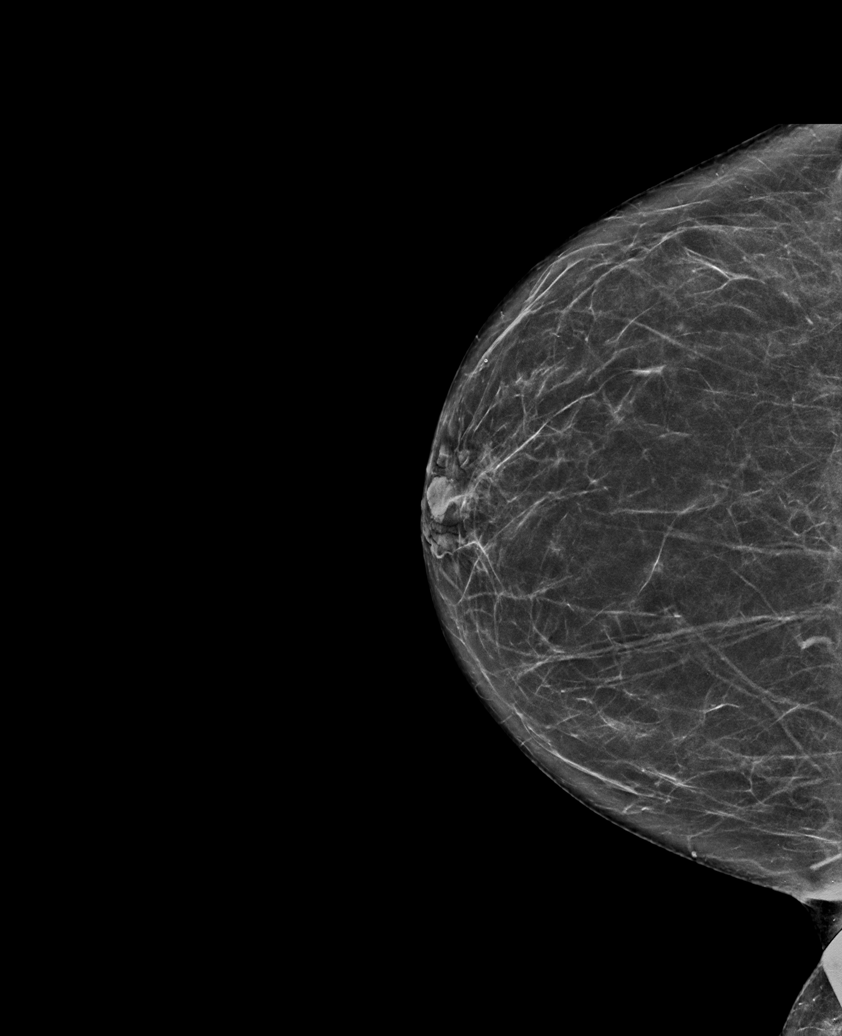

[L MLO tomo · tomo slice 41/81.0]
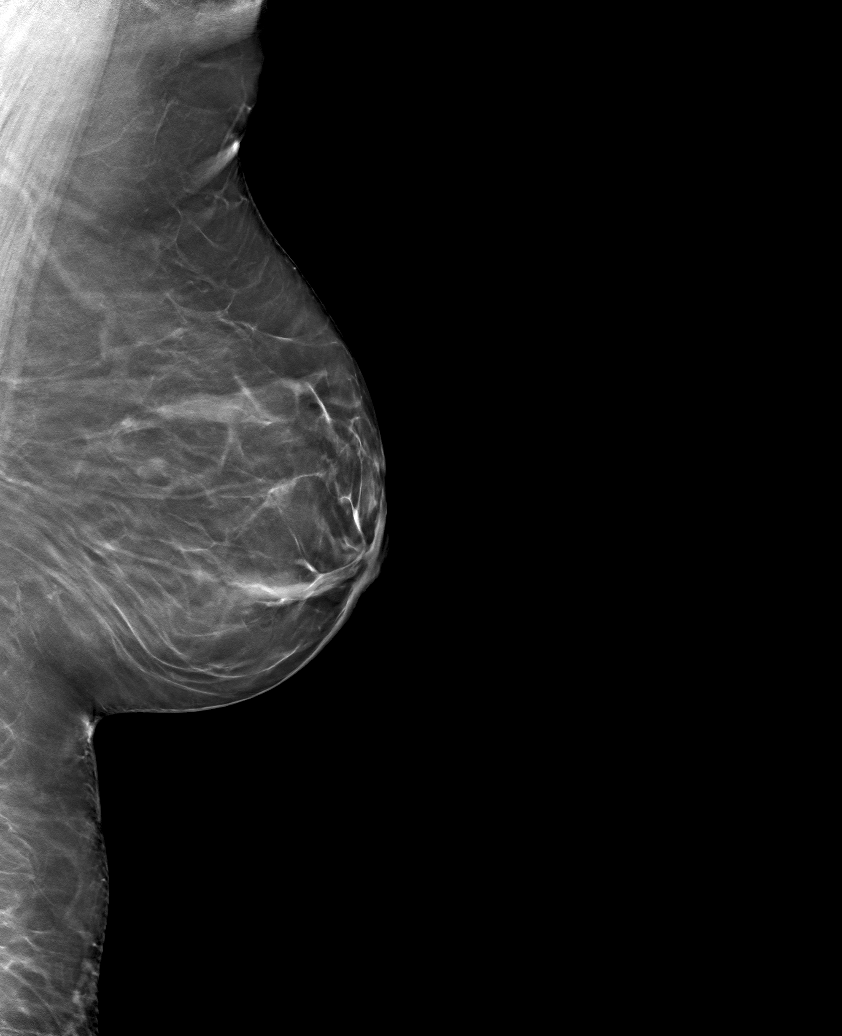

[6 of 30 positions shown; findings below may reference images not displayed]

ACR Breast Density Category b: There are scattered areas of
fibroglandular density.
FINDINGS: In the left breast, a possible asymmetry warrants further
evaluation. In the right breast, no findings suspicious for
malignancy.
IMPRESSION: Further evaluation is suggested for possible asymmetry in the left
breast.

RECOMMENDATION:
Diagnostic mammogram and possibly ultrasound of the left breast.
(Code:[id number])

The patient will be contacted regarding the findings, and additional
imaging will be scheduled.

BI-RADS CATEGORY  0: Incomplete. Need additional imaging evaluation
and/or prior mammograms for comparison.

## 2023-11-03 ENCOUNTER — Other Ambulatory Visit: Payer: Self-pay | Admitting: Family Medicine

## 2023-11-04 NOTE — Telephone Encounter (Signed)
 Requested medication (s) are due for refill today:   Yes for both  Requested medication (s) are on the active medication list:   Yes for both  Future visit scheduled:   No.   Had appt 06/26/2023 that was cancelled by the hospital.  Appt 06/04/2023 a No Show.   LOV 02/18/2023   Last ordered: Lisinopril  10/02/2023 #30, 0 refills;   10/02/2023 #30, 0 refills  Unable to refill because labs are due.   Pt was a No Show for 05/25/2023.     Requested Prescriptions  Pending Prescriptions Disp Refills   lisinopril  (ZESTRIL ) 20 MG tablet [Pharmacy Med Name: LISINOPRIL  20 MG TABLET] 90 tablet 1    Sig: TAKE 1 TABLET BY MOUTH EVERY DAY     Cardiovascular:  ACE Inhibitors Failed - 11/04/2023  1:44 PM      Failed - Cr in normal range and within 180 days    Creatinine, Ser  Date Value Ref Range Status  02/18/2023 0.68 0.57 - 1.00 mg/dL Final         Failed - K in normal range and within 180 days    Potassium  Date Value Ref Range Status  02/18/2023 5.8 (H) 3.5 - 5.2 mmol/L Final         Failed - Last BP in normal range    BP Readings from Last 1 Encounters:  02/18/23 (!) 141/79         Failed - Valid encounter within last 6 months    Recent Outpatient Visits           4 months ago Appointment canceled by hospital   [facility] [place], Duwaine SQUIBB, DO              Passed - Patient is not pregnant       pravastatin  (PRAVACHOL ) 80 MG tablet [Pharmacy Med Name: PRAVASTATIN  SODIUM 80 MG TAB] 90 tablet 1    Sig: TAKE 1 TABLET BY MOUTH EVERY DAY     Cardiovascular:  Antilipid - Statins Failed - 11/04/2023  1:44 PM      Failed - Valid encounter within last 12 months    Recent Outpatient Visits           4 months ago Appointment canceled by hospital   [place] [facility] [place], [place] P, DO              Failed - Lipid Panel in normal range within the last 12 months    Cholesterol, Total  Date Value Ref Range Status  02/18/2023 255 (H) 100  - 199 mg/dL Final   Cholesterol Piccolo, Waived  Date Value Ref Range Status  01/19/2015 221 (H) <200 mg/dL Final    Comment:                            Desirable                <200                         Borderline High      200- 239                         High                     >239    LDL Chol  Calc (NIH)  Date Value Ref Range Status  02/18/2023 183 (H) 0 - 99 mg/dL Final   HDL  Date Value Ref Range Status  02/18/2023 55 >39 mg/dL Final   Triglycerides  Date Value Ref Range Status  02/18/2023 99 0 - 149 mg/dL Final   Triglycerides Piccolo,Waived  Date Value Ref Range Status  01/19/2015 91 <150 mg/dL Final    Comment:                            Normal                   <150                         Borderline High     150 - 199                         High                200 - 499                         Very High                >499          Passed - Patient is not pregnant

## 2023-11-04 NOTE — Telephone Encounter (Signed)
 Appt scheduled 12/21/23

## 2023-11-04 NOTE — Telephone Encounter (Signed)
 Patient is overdue for an appointment. Please call to schedule and then route to provider for refill.

## 2023-11-06 ENCOUNTER — Other Ambulatory Visit: Payer: Self-pay | Admitting: Family Medicine

## 2023-11-06 NOTE — Telephone Encounter (Signed)
 Requested Prescriptions  Pending Prescriptions Disp Refills   montelukast  (SINGULAIR ) 10 MG tablet [Pharmacy Med Name: MONTELUKAST  SOD 10 MG TABLET] 90 tablet 0    Sig: TAKE 1 TABLET BY MOUTH EVERYDAY AT BEDTIME     Pulmonology:  Leukotriene Inhibitors Failed - 11/06/2023  1:56 PM      Failed - Valid encounter within last 12 months    Recent Outpatient Visits           4 months ago Appointment canceled by hospital   [facility], Megan P, DO

## 2023-12-01 ENCOUNTER — Other Ambulatory Visit: Payer: Self-pay | Admitting: Family Medicine

## 2023-12-02 NOTE — Telephone Encounter (Signed)
 Requested medication (s) are due for refill today:   Requested medication (s) are on the active medication list: Yes  Last refill:  10/02/23  Future visit scheduled: Yes  Notes to clinic:  See pharmacy request.    Requested Prescriptions  Pending Prescriptions Disp Refills   omeprazole  (PRILOSEC) 20 MG capsule [Pharmacy Med Name: OMEPRAZOLE  DR 20 MG CAPSULE] 90 capsule 1    Sig: TAKE 1 CAPSULE BY MOUTH EVERY DAY     Gastroenterology: Proton Pump Inhibitors Failed - 12/02/2023  1:42 PM      Failed - Valid encounter within last 12 months    Recent Outpatient Visits           5 months ago Appointment canceled by hospital   [facility], Megan P, DO

## 2023-12-05 ENCOUNTER — Other Ambulatory Visit: Payer: Self-pay | Admitting: Family Medicine

## 2023-12-08 NOTE — Telephone Encounter (Signed)
 OV 02/18/23 Requested Prescriptions  Pending Prescriptions Disp Refills   gabapentin  (NEURONTIN ) 300 MG capsule [Pharmacy Med Name: GABAPENTIN  300 MG CAPSULE] 540 capsule 2    Sig: TAKE 2 CAPSULES BY MOUTH 3 TIMES DAILY.     Neurology: Anticonvulsants - gabapentin  Failed - 12/08/2023 10:37 AM      Failed - Completed PHQ-2 or PHQ-9 in the last 360 days      Failed - Valid encounter within last 12 months    Recent Outpatient Visits           5 months ago Appointment canceled by hospital   [facility], Megan P, DO              Passed - Cr in normal range and within 360 days    Creatinine, Ser  Date Value Ref Range Status  02/18/2023 0.68 0.57 - 1.00 mg/dL Final

## 2023-12-21 ENCOUNTER — Ambulatory Visit (INDEPENDENT_AMBULATORY_CARE_PROVIDER_SITE_OTHER): Admitting: Family Medicine

## 2023-12-21 ENCOUNTER — Encounter: Payer: Self-pay | Admitting: Family Medicine

## 2023-12-21 VITALS — BP 135/80 | HR 87 | Temp 98.0°F | Ht 60.0 in | Wt 243.4 lb

## 2023-12-21 DIAGNOSIS — Z1231 Encounter for screening mammogram for malignant neoplasm of breast: Secondary | ICD-10-CM | POA: Diagnosis not present

## 2023-12-21 DIAGNOSIS — E782 Mixed hyperlipidemia: Secondary | ICD-10-CM

## 2023-12-21 DIAGNOSIS — Z720 Tobacco use: Secondary | ICD-10-CM

## 2023-12-21 DIAGNOSIS — Z789 Other specified health status: Secondary | ICD-10-CM | POA: Diagnosis not present

## 2023-12-21 DIAGNOSIS — J410 Simple chronic bronchitis: Secondary | ICD-10-CM

## 2023-12-21 DIAGNOSIS — I129 Hypertensive chronic kidney disease with stage 1 through stage 4 chronic kidney disease, or unspecified chronic kidney disease: Secondary | ICD-10-CM | POA: Diagnosis not present

## 2023-12-21 LAB — MICROALBUMIN, URINE WAIVED
Creatinine, Urine Waived: 50 mg/dL (ref 10–300)
Microalb, Ur Waived: 10 mg/L (ref 0–19)

## 2023-12-21 LAB — BAYER DCA HB A1C WAIVED: HB A1C (BAYER DCA - WAIVED): 5.2 % (ref 4.8–5.6)

## 2023-12-21 MED ORDER — ALBUTEROL SULFATE (2.5 MG/3ML) 0.083% IN NEBU: 2.5000 mg | INHALATION_SOLUTION | RESPIRATORY_TRACT | 6 refills | Status: AC | PRN

## 2023-12-21 MED ORDER — BUDESONIDE-FORMOTEROL FUMARATE 160-4.5 MCG/ACT IN AERO: 2.0000 | INHALATION_SPRAY | Freq: Two times a day (BID) | RESPIRATORY_TRACT | 12 refills | Status: AC

## 2023-12-21 MED ORDER — MONTELUKAST SODIUM 10 MG PO TABS: ORAL_TABLET | ORAL | 1 refills | Status: AC

## 2023-12-21 MED ORDER — CETIRIZINE HCL 10 MG PO TABS: 10.0000 mg | ORAL_TABLET | Freq: Every day | ORAL | 3 refills | Status: AC

## 2023-12-21 MED ORDER — FAMOTIDINE 20 MG PO TABS: 20.0000 mg | ORAL_TABLET | Freq: Two times a day (BID) | ORAL | 1 refills | Status: AC

## 2023-12-21 MED ORDER — PRAVASTATIN SODIUM 80 MG PO TABS: 80.0000 mg | ORAL_TABLET | Freq: Every day | ORAL | 1 refills | Status: AC

## 2023-12-21 MED ORDER — ALBUTEROL SULFATE HFA 108 (90 BASE) MCG/ACT IN AERS: 1.0000 | INHALATION_SPRAY | RESPIRATORY_TRACT | 12 refills | Status: AC | PRN

## 2023-12-21 MED ORDER — DICLOFENAC SODIUM 75 MG PO TBEC: 75.0000 mg | DELAYED_RELEASE_TABLET | Freq: Two times a day (BID) | ORAL | 1 refills | Status: AC

## 2023-12-21 MED ORDER — OMEPRAZOLE 20 MG PO CPDR: 20.0000 mg | DELAYED_RELEASE_CAPSULE | Freq: Every day | ORAL | 1 refills | Status: AC

## 2023-12-21 MED ORDER — GABAPENTIN 300 MG PO CAPS: 600.0000 mg | ORAL_CAPSULE | Freq: Three times a day (TID) | ORAL | 1 refills | Status: AC

## 2023-12-21 MED ORDER — LISINOPRIL 20 MG PO TABS: 20.0000 mg | ORAL_TABLET | Freq: Every day | ORAL | 1 refills | Status: AC

## 2023-12-21 MED ORDER — FLUTICASONE PROPIONATE 50 MCG/ACT NA SUSP: NASAL | 6 refills | Status: AC

## 2023-12-21 NOTE — Progress Notes (Signed)
 BP 135/80 (BP Location: Left Arm, Patient Position: Sitting, Cuff Size: Normal)   Pulse 87   Temp 98 F (36.7 C) (Oral)   Ht 5' (1.524 m)   Wt 243 lb 6.4 oz (110.4 kg)   LMP 08/02/2017 (Exact Date)   SpO2 96%   BMI 47.54 kg/m    Subjective:    Patient ID: Nancy Stout, female    DOB: 1965-08-04, 58 y.o.   MRN: [id number]  HPI: Nancy Stout is a 58 y.o. female  Chief Complaint  Patient presents with   Hypertension   COPD   HYPERTENSION / HYPERLIPIDEMIA Satisfied with current treatment? yes Duration of hypertension: chronic BP monitoring frequency: not checking BP medication side effects: no Past BP meds: lisinopril  Duration of hyperlipidemia: chronic Cholesterol medication side effects: no Cholesterol supplements: none Past cholesterol medications: pravastatin  Medication compliance: excellent compliance Aspirin: no Recent stressors: no Recurrent headaches: no Visual changes: no Palpitations: no Dyspnea: yes Chest pain: no Lower extremity edema: no Dizzy/lightheaded: no  COPD COPD status: controlled Satisfied with current treatment?: yes Oxygen use: no Dyspnea frequency: occasionally Cough frequency: daily Rescue inhaler frequency: daily  Limitation of activity: no Pneumovax: Up to Date Influenza: Not up to Date  Has been wearing a knee brace. They want her to lose 50lbs to be able to do a knee replacement  Relevant past medical, surgical, family and social history reviewed and updated as indicated. Interim medical history since our last visit reviewed. Allergies and medications reviewed and updated.  Review of Systems  Constitutional: Negative.   Respiratory:  Positive for cough, shortness of breath and wheezing. Negative for apnea, choking, chest tightness and stridor.   Cardiovascular: Negative.   Musculoskeletal: Negative.   Skin: Negative.   Neurological: Negative.   Psychiatric/Behavioral: Negative.      Per HPI unless specifically  indicated above     Objective:    BP 135/80 (BP Location: Left Arm, Patient Position: Sitting, Cuff Size: Normal)   Pulse 87   Temp 98 F (36.7 C) (Oral)   Ht 5' (1.524 m)   Wt 243 lb 6.4 oz (110.4 kg)   LMP 08/02/2017 (Exact Date)   SpO2 96%   BMI 47.54 kg/m   Wt Readings from Last 3 Encounters:  12/21/23 243 lb 6.4 oz (110.4 kg)  02/18/23 242 lb 3.2 oz (109.9 kg)  11/18/22 243 lb 9.6 oz (110.5 kg)    Physical Exam Vitals and nursing note reviewed.  Constitutional:      General: She is not in acute distress.    Appearance: Normal appearance. She is obese. She is not ill-appearing, toxic-appearing or diaphoretic.  HENT:     Head: Normocephalic and atraumatic.     Right Ear: External ear normal.     Left Ear: External ear normal.     Nose: Nose normal.     Mouth/Throat:     Mouth: Mucous membranes are moist.     Pharynx: Oropharynx is clear.  Eyes:     General: No scleral icterus.       Right eye: No discharge.        Left eye: No discharge.     Extraocular Movements: Extraocular movements intact.     Conjunctiva/sclera: Conjunctivae normal.     Pupils: Pupils are equal, round, and reactive to light.  Cardiovascular:     Rate and Rhythm: Normal rate and regular rhythm.     Pulses: Normal pulses.     Heart sounds: Normal heart sounds. No murmur  heard.    No friction rub. No gallop.  Pulmonary:     Effort: Pulmonary effort is normal. No respiratory distress.     Breath sounds: No stridor. Wheezing present. No rhonchi or rales.  Chest:     Chest wall: No tenderness.  Musculoskeletal:        General: Normal range of motion.     Cervical back: Normal range of motion and neck supple.  Skin:    General: Skin is warm and dry.     Capillary Refill: Capillary refill takes less than 2 seconds.     Coloration: Skin is not jaundiced or pale.     Findings: No bruising, erythema, lesion or rash.  Neurological:     General: No focal deficit present.     Mental Status: She  is alert and oriented to person, place, and time. Mental status is at baseline.  Psychiatric:        Mood and Affect: Mood normal.        Behavior: Behavior normal.        Thought Content: Thought content normal.        Judgment: Judgment normal.     Results for orders placed or performed in visit on 12/21/23  Bayer DCA Hb A1c Waived   Collection Time: 12/21/23  8:30 AM  Result Value Ref Range   HB A1C (BAYER DCA - WAIVED) 5.2 4.8 - 5.6 %  Microalbumin, Urine Waived   Collection Time: 12/21/23  8:30 AM  Result Value Ref Range   Microalb, Ur Waived 10 0 - 19 mg/L   Creatinine, Urine Waived 50 10 - 300 mg/dL   Microalb/Creat Ratio 30-300 (H) <30 mg/g      Assessment & Plan:   Problem List Items Addressed This Visit       Respiratory   COPD (chronic obstructive pulmonary disease) (HCC)   Under good control on current regimen. Continue current regimen. Continue to monitor. Call with any concerns. Refills given. Labs drawn today.       Relevant Medications   albuterol  (PROVENTIL ) (2.5 MG/3ML) 0.083% nebulizer solution   albuterol  (VENTOLIN  HFA) 108 (90 Base) MCG/ACT inhaler   budesonide -formoterol  (SYMBICORT ) 160-4.5 MCG/ACT inhaler   cetirizine  (ZYRTEC ) 10 MG tablet   fluticasone  (FLONASE ) 50 MCG/ACT nasal spray   montelukast  (SINGULAIR ) 10 MG tablet   Other Relevant Orders   CBC with Differential/Platelet   Comprehensive metabolic panel with GFR     Genitourinary   Benign hypertensive renal disease - Primary   Under good control on current regimen. Continue current regimen. Continue to monitor. Call with any concerns. Refills given. Labs drawn today.        Relevant Orders   CBC with Differential/Platelet   Comprehensive metabolic panel with GFR   TSH   Microalbumin, Urine Waived (Completed)     Other   Hyperlipidemia   Under good control on current regimen. Continue current regimen. Continue to monitor. Call with any concerns. Refills given. Labs drawn today.        Relevant Medications   lisinopril  (ZESTRIL ) 20 MG tablet   pravastatin  (PRAVACHOL ) 80 MG tablet   Other Relevant Orders   CBC with Differential/Platelet   Comprehensive metabolic panel with GFR   Lipid Panel w/o Chol/HDL Ratio   Tobacco abuse   Due for LDCT- referral for lung cancer screening placed today.       Relevant Orders   Ambulatory Referral Lung Cancer Screening [facility]   Morbid obesity (HCC)  Encouraged diet and exercise with goal of losing 1-2lbs per week. Will work on cutting back on pasta. Call with any concerns.       Relevant Orders   Bayer DCA Hb A1c Waived (Completed)   Other Visit Diagnoses       Encounter for screening mammogram for malignant neoplasm of breast       Mammogram ordered today.   Relevant Orders   MM 3D SCREENING MAMMOGRAM BILATERAL BREAST     Hepatitis B vaccination status unknown       Labs drawn today. Await results. Treat as needed.   Relevant Orders   Hepatitis B surface antibody,quantitative        Follow up plan: Return ASAP for form fill out.

## 2023-12-21 NOTE — Assessment & Plan Note (Signed)
 Under good control on current regimen. Continue current regimen. Continue to monitor. Call with any concerns. Refills given. Labs drawn today.

## 2023-12-21 NOTE — Assessment & Plan Note (Signed)
 Encouraged diet and exercise with goal of losing 1-2lbs per week. Will work on cutting back on pasta. Call with any concerns.

## 2023-12-21 NOTE — Assessment & Plan Note (Signed)
 Due for LDCT- referral for lung cancer screening placed today.

## 2023-12-22 ENCOUNTER — Encounter: Payer: Self-pay | Admitting: Family Medicine

## 2023-12-22 ENCOUNTER — Ambulatory Visit (INDEPENDENT_AMBULATORY_CARE_PROVIDER_SITE_OTHER): Admitting: Family Medicine

## 2023-12-22 VITALS — BP 124/82 | HR 90 | Temp 98.5°F | Wt 242.6 lb

## 2023-12-22 DIAGNOSIS — J410 Simple chronic bronchitis: Secondary | ICD-10-CM | POA: Diagnosis not present

## 2023-12-22 DIAGNOSIS — M150 Primary generalized (osteo)arthritis: Secondary | ICD-10-CM

## 2023-12-22 LAB — CBC WITH DIFFERENTIAL/PLATELET
Basophils Absolute: 0.1 x10E3/uL (ref 0.0–0.2)
Basos: 1 %
EOS (ABSOLUTE): 0.1 x10E3/uL (ref 0.0–0.4)
Eos: 2 %
Hematocrit: 50 % — ABNORMAL HIGH (ref 34.0–46.6)
Hemoglobin: 16.8 g/dL — ABNORMAL HIGH (ref 11.1–15.9)
Immature Grans (Abs): 0 x10E3/uL (ref 0.0–0.1)
Immature Granulocytes: 0 %
Lymphocytes Absolute: 1.6 x10E3/uL (ref 0.7–3.1)
Lymphs: 28 %
MCH: 34 pg — ABNORMAL HIGH (ref 26.6–33.0)
MCHC: 33.6 g/dL (ref 31.5–35.7)
MCV: 101 fL — ABNORMAL HIGH (ref 79–97)
Monocytes Absolute: 0.5 x10E3/uL (ref 0.1–0.9)
Monocytes: 8 %
Neutrophils Absolute: 3.4 x10E3/uL (ref 1.4–7.0)
Neutrophils: 61 %
Platelets: 229 x10E3/uL (ref 150–450)
RBC: 4.94 x10E6/uL (ref 3.77–5.28)
RDW: 13.1 % (ref 11.7–15.4)
WBC: 5.7 x10E3/uL (ref 3.4–10.8)

## 2023-12-22 LAB — COMPREHENSIVE METABOLIC PANEL WITH GFR
ALT: 22 IU/L (ref 0–32)
AST: 17 IU/L (ref 0–40)
Albumin: 4.2 g/dL (ref 3.8–4.9)
Alkaline Phosphatase: 127 IU/L (ref 49–135)
BUN/Creatinine Ratio: 20 (ref 9–23)
BUN: 14 mg/dL (ref 6–24)
Bilirubin Total: 0.4 mg/dL (ref 0.0–1.2)
CO2: 25 mmol/L (ref 20–29)
Calcium: 9.1 mg/dL (ref 8.7–10.2)
Chloride: 99 mmol/L (ref 96–106)
Creatinine, Ser: 0.71 mg/dL (ref 0.57–1.00)
Globulin, Total: 2.7 g/dL (ref 1.5–4.5)
Glucose: 92 mg/dL (ref 70–99)
Potassium: 5.3 mmol/L — ABNORMAL HIGH (ref 3.5–5.2)
Sodium: 137 mmol/L (ref 134–144)
Total Protein: 6.9 g/dL (ref 6.0–8.5)
eGFR: 98 mL/min/1.73 (ref >59)

## 2023-12-22 LAB — LIPID PANEL W/O CHOL/HDL RATIO
Cholesterol, Total: 188 mg/dL (ref 100–199)
HDL: 59 mg/dL (ref >39)
LDL Chol Calc (NIH): 111 mg/dL — ABNORMAL HIGH (ref 0–99)
Triglycerides: 103 mg/dL (ref 0–149)
VLDL Cholesterol Cal: 18 mg/dL (ref 5–40)

## 2023-12-22 LAB — HEPATITIS B SURFACE ANTIBODY, QUANTITATIVE: Hepatitis B Surf Ab Quant: <3.5 m[IU]/mL — ABNORMAL LOW

## 2023-12-22 LAB — TSH: TSH: 2.68 u[IU]/mL (ref 0.450–4.500)

## 2023-12-22 NOTE — Assessment & Plan Note (Signed)
 Working with lawyer to get disability. We will fill form out but discussed that we do not recommend long term disability here and advise her to follow up with social security department if she needs disability evaluation.

## 2023-12-22 NOTE — Progress Notes (Signed)
 BP 124/82 (BP Location: Left Arm, Patient Position: Sitting, Cuff Size: Large)   Pulse 90   Temp 98.5 F (36.9 C)   Wt 242 lb 9.6 oz (110 kg)   LMP 08/02/2017 (Exact Date)   SpO2 96%   BMI 47.38 kg/m    Subjective:    Patient ID: Nancy Stout, female    DOB: 15-Apr-1965, 58 y.o.   MRN: [id number]  HPI: Nancy Stout is a 58 y.o. female  Chief Complaint  Patient presents with   Forms   She is working with a Clinical research associate to try to get disability. Has been having pain in her leg from her arthritis, especially in her L knee and back. She also has issues with her breathing where she has difficulty with DOE. She is otherwise doing well. See scanned document.   Relevant past medical, surgical, family and social history reviewed and updated as indicated. Interim medical history since our last visit reviewed. Allergies and medications reviewed and updated.  Review of Systems  Constitutional: Negative.   HENT: Negative.    Respiratory:  Positive for shortness of breath. Negative for apnea, cough, choking, chest tightness, wheezing and stridor.   Cardiovascular: Negative.   Musculoskeletal:  Positive for arthralgias, back pain, gait problem and myalgias. Negative for joint swelling, neck pain and neck stiffness.  Skin: Negative.   Neurological:  Negative for dizziness, tremors, seizures, syncope, facial asymmetry, speech difficulty, weakness, light-headedness, numbness and headaches.  Psychiatric/Behavioral: Negative.      Per HPI unless specifically indicated above     Objective:    BP 124/82 (BP Location: Left Arm, Patient Position: Sitting, Cuff Size: Large)   Pulse 90   Temp 98.5 F (36.9 C)   Wt 242 lb 9.6 oz (110 kg)   LMP 08/02/2017 (Exact Date)   SpO2 96%   BMI 47.38 kg/m   Wt Readings from Last 3 Encounters:  12/22/23 242 lb 9.6 oz (110 kg)  12/21/23 243 lb 6.4 oz (110.4 kg)  02/18/23 242 lb 3.2 oz (109.9 kg)    Physical Exam Vitals and nursing note reviewed.   Constitutional:      General: She is not in acute distress.    Appearance: Normal appearance. She is not ill-appearing, toxic-appearing or diaphoretic.  HENT:     Head: Normocephalic and atraumatic.     Right Ear: External ear normal.     Left Ear: External ear normal.     Nose: Nose normal.     Mouth/Throat:     Mouth: Mucous membranes are moist.     Pharynx: Oropharynx is clear.  Eyes:     General: No scleral icterus.       Right eye: No discharge.        Left eye: No discharge.     Extraocular Movements: Extraocular movements intact.     Conjunctiva/sclera: Conjunctivae normal.     Pupils: Pupils are equal, round, and reactive to light.  Cardiovascular:     Rate and Rhythm: Normal rate and regular rhythm.     Pulses: Normal pulses.     Heart sounds: Normal heart sounds. No murmur heard.    No friction rub. No gallop.  Pulmonary:     Effort: Pulmonary effort is normal. No respiratory distress.     Breath sounds: Normal breath sounds. No stridor. No wheezing, rhonchi or rales.  Chest:     Chest wall: No tenderness.  Musculoskeletal:        General: Normal range of motion.  Cervical back: Normal range of motion and neck supple.  Skin:    General: Skin is warm and dry.     Capillary Refill: Capillary refill takes less than 2 seconds.     Coloration: Skin is not jaundiced or pale.     Findings: No bruising, erythema, lesion or rash.  Neurological:     General: No focal deficit present.     Mental Status: She is alert and oriented to person, place, and time. Mental status is at baseline.  Psychiatric:        Mood and Affect: Mood normal.        Behavior: Behavior normal.        Thought Content: Thought content normal.        Judgment: Judgment normal.     Results for orders placed or performed in visit on 12/21/23  Bayer DCA Hb A1c Waived   Collection Time: 12/21/23  8:30 AM  Result Value Ref Range   HB A1C (BAYER DCA - WAIVED) 5.2 4.8 - 5.6 %  Microalbumin,  Urine Waived   Collection Time: 12/21/23  8:30 AM  Result Value Ref Range   Microalb, Ur Waived 10 0 - 19 mg/L   Creatinine, Urine Waived 50 10 - 300 mg/dL   Microalb/Creat Ratio 30-300 (H) <30 mg/g  CBC with Differential/Platelet   Collection Time: 12/21/23  8:31 AM  Result Value Ref Range   WBC 5.7 3.4 - 10.8 x10E3/uL   RBC 4.94 3.77 - 5.28 x10E6/uL   Hemoglobin 16.8 (H) 11.1 - 15.9 g/dL   Hematocrit 49.9 (H) 65.9 - 46.6 %   MCV 101 (H) 79 - 97 fL   MCH 34.0 (H) 26.6 - 33.0 pg   MCHC 33.6 31.5 - 35.7 g/dL   RDW 86.8 88.2 - 84.5 %   Platelets 229 150 - 450 x10E3/uL   Neutrophils 61 Not Estab. %   Lymphs 28 Not Estab. %   Monocytes 8 Not Estab. %   Eos 2 Not Estab. %   Basos 1 Not Estab. %   Neutrophils Absolute 3.4 1.4 - 7.0 x10E3/uL   Lymphocytes Absolute 1.6 0.7 - 3.1 x10E3/uL   Monocytes Absolute 0.5 0.1 - 0.9 x10E3/uL   EOS (ABSOLUTE) 0.1 0.0 - 0.4 x10E3/uL   Basophils Absolute 0.1 0.0 - 0.2 x10E3/uL   Immature Granulocytes 0 Not Estab. %   Immature Grans (Abs) 0.0 0.0 - 0.1 x10E3/uL  Comprehensive metabolic panel with GFR   Collection Time: 12/21/23  8:31 AM  Result Value Ref Range   Glucose 92 70 - 99 mg/dL   BUN 14 6 - 24 mg/dL   Creatinine, Ser 9.28 0.57 - 1.00 mg/dL   eGFR 98 >40 fO/fpw/8.26   BUN/Creatinine Ratio 20 9 - 23   Sodium 137 134 - 144 mmol/L   Potassium 5.3 (H) 3.5 - 5.2 mmol/L   Chloride 99 96 - 106 mmol/L   CO2 25 20 - 29 mmol/L   Calcium 9.1 8.7 - 10.2 mg/dL   Total Protein 6.9 6.0 - 8.5 g/dL   Albumin 4.2 3.8 - 4.9 g/dL   Globulin, Total 2.7 1.5 - 4.5 g/dL   Bilirubin Total 0.4 0.0 - 1.2 mg/dL   Alkaline Phosphatase 127 49 - 135 IU/L   AST 17 0 - 40 IU/L   ALT 22 0 - 32 IU/L  Lipid Panel w/o Chol/HDL Ratio   Collection Time: 12/21/23  8:31 AM  Result Value Ref Range   Cholesterol, Total 188 100 -  199 mg/dL   Triglycerides 896 0 - 149 mg/dL   HDL 59 >60 mg/dL   VLDL Cholesterol Cal 18 5 - 40 mg/dL   LDL Chol Calc (NIH) 888 (H) 0 - 99  mg/dL  Hepatitis B surface antibody,quantitative   Collection Time: 12/21/23  8:31 AM  Result Value Ref Range   Hepatitis B Surf Ab Quant <3.5 (L) Immunity>10 mIU/mL  TSH   Collection Time: 12/21/23  8:31 AM  Result Value Ref Range   TSH 2.680 0.450 - 4.500 uIU/mL      Assessment & Plan:   Problem List Items Addressed This Visit       Respiratory   COPD (chronic obstructive pulmonary disease) (HCC) - Primary   Working with lawyer to get disability. We will fill form out but discussed that we do not recommend long term disability here and advise her to follow up with social security department if she needs disability evaluation.         Musculoskeletal and Integument   Osteoarthritis   Working with lawyer to get disability. We will fill form out but discussed that we do not recommend long term disability here and advise her to follow up with social security department if she needs disability evaluation.         Follow up plan: Return in about 6 months (around 06/20/2024).  >15 minutes spent with patient today

## 2023-12-23 ENCOUNTER — Ambulatory Visit: Payer: Self-pay | Admitting: Family Medicine

## 2023-12-29 ENCOUNTER — Telehealth: Payer: Self-pay | Admitting: Acute Care

## 2023-12-29 DIAGNOSIS — Z122 Encounter for screening for malignant neoplasm of respiratory organs: Secondary | ICD-10-CM

## 2023-12-29 DIAGNOSIS — F17210 Nicotine dependence, cigarettes, uncomplicated: Secondary | ICD-10-CM

## 2023-12-29 DIAGNOSIS — Z87891 Personal history of nicotine dependence: Secondary | ICD-10-CM

## 2023-12-29 NOTE — Telephone Encounter (Signed)
 Lung Cancer Screening Narrative/Criteria Questionnaire (Cigarette Smokers Only- No Cigars/Pipes/vapes)   Nancy Stout   SDMV:01/01/24 at 1130a/[place]                                           1966/02/01               LDCT: 10/1/025 at 11a/[facility]    58 y.o.   Phone: [phone]  Lung Screening Narrative (confirm age 17-77 yrs Medicare / 50-80 yrs Private pay insurance)   Insurance information:Medicaid   Referring Provider:Johnson   This screening involves an initial phone call with a team member from our program. It is called a shared decision making visit. The initial meeting is required by insurance and Medicare to make sure you understand the program. This appointment takes about 15-20 minutes to complete. The CT scan will completed at a separate date/time. This scan takes about 5-10 minutes to complete and you may eat and drink before and after the scan.  Criteria questions for Lung Cancer Screening:   Are you a current or former smoker? Current Age began smoking: 17y   If you are a former smoker, what year did you quit smoking? NA   To calculate your smoking history, I need an accurate estimate of Stout many packs of cigarettes you smoked per day and for Stout many years. (Not just the number of PPD you are now smoking)   Years smoking 41 x Packs per day 1 = Pack years 41   (at least 20 pack yrs)   (Make sure they understand that we need to know Stout much they have smoked in the past, not just the number of PPD they are smoking now)  Do you have a personal history of cancer?  No    Do you have a family history of cancer? Yes  (cancer type and and relative) mom/ovarian, father/lung, sister/breast  Are you coughing up blood?  No  Have you had unexplained weight loss of 15 lbs or more in the last 6 months? No  It looks like you meet all criteria.     Additional information: N/A

## 2024-01-01 ENCOUNTER — Ambulatory Visit (INDEPENDENT_AMBULATORY_CARE_PROVIDER_SITE_OTHER): Admitting: *Deleted

## 2024-01-01 ENCOUNTER — Encounter: Payer: Self-pay | Admitting: *Deleted

## 2024-01-01 DIAGNOSIS — F17210 Nicotine dependence, cigarettes, uncomplicated: Secondary | ICD-10-CM | POA: Diagnosis not present

## 2024-01-01 NOTE — Patient Instructions (Signed)

## 2024-01-01 NOTE — Progress Notes (Signed)
   Virtual Visit via Telephone Note  I connected with Nancy Stout on 01/01/24 at 11:30 AM EDT by telephone and verified that I am speaking with the correct person using two identifiers.  Location: Patient: at home Provider: [address] [place], [place], [address]    I discussed the limitations, risks, security and privacy concerns of performing an evaluation and management service by telephone and the availability of in person appointments. I also discussed with the patient that there may be a patient responsible charge related to this service. The patient expressed understanding and agreed to proceed.   Shared Decision Making Visit Lung Cancer Screening Program [phone])   Eligibility: Age 58 y.o. Pack Years Smoking History Calculation 40 (# packs/per year x # years smoked) Recent History of coughing up blood  no Unexplained weight loss? no ( >Than 15 pounds within the last 6 months ) Prior History Lung / other cancer no (Diagnosis within the last 5 years already requiring surveillance chest CT Scans). Smoking Status Current Smoker Former Smokers: Years since quit: n/a  Quit Date: n/a  Visit Components: Discussion included one or more decision making aids. yes Discussion included risk/benefits of screening. yes Discussion included potential follow up diagnostic testing for abnormal scans. yes Discussion included meaning and risk of over diagnosis. yes Discussion included meaning and risk of False Positives. yes Discussion included meaning of total radiation exposure. yes  Counseling Included: Importance of adherence to annual lung cancer LDCT screening. yes Impact of comorbidities on ability to participate in the program. yes Ability and willingness to under diagnostic treatment. yes  Smoking Cessation Counseling: Current Smokers:  Discussed importance of smoking cessation. yes Information about tobacco cessation classes and interventions provided to  patient. yes Patient provided with ticket for LDCT Scan. no Symptomatic Patient. no  Counseling(Intermediate counseling: > three minutes) [id number] Diagnosis Code: Tobacco Use Z72.0 Asymptomatic Patient yes  Counseled patient 4 minutes regarding tobacco use.   Former Smokers:  Discussed the importance of maintaining cigarette abstinence. yes Diagnosis Code: Personal History of Nicotine  Dependence. S12.108 Information about tobacco cessation classes and interventions provided to patient. Yes Patient provided with ticket for LDCT Scan. no Written Order for Lung Cancer Screening with LDCT placed in Epic. Yes (CT Chest Lung Cancer Screening Low Dose W/O CM) [id number] Z12.2-Screening of respiratory organs Z87.891-Personal history of nicotine  dependence   Laneta Speaks, RN

## 2024-01-05 ENCOUNTER — Other Ambulatory Visit

## 2024-01-06 ENCOUNTER — Ambulatory Visit
Admission: RE | Admit: 2024-01-06 | Discharge: 2024-01-06 | Disposition: A | Discharge status: Home / Self Care | Source: Ambulatory Visit | Attending: Acute Care | Admitting: Acute Care

## 2024-01-06 ENCOUNTER — Other Ambulatory Visit

## 2024-01-06 ENCOUNTER — Other Ambulatory Visit: Payer: Self-pay | Admitting: Family Medicine

## 2024-01-06 DIAGNOSIS — E875 Hyperkalemia: Secondary | ICD-10-CM

## 2024-01-06 DIAGNOSIS — F17210 Nicotine dependence, cigarettes, uncomplicated: Secondary | ICD-10-CM | POA: Diagnosis present

## 2024-01-06 DIAGNOSIS — Z122 Encounter for screening for malignant neoplasm of respiratory organs: Secondary | ICD-10-CM | POA: Insufficient documentation

## 2024-01-06 DIAGNOSIS — Z87891 Personal history of nicotine dependence: Secondary | ICD-10-CM | POA: Diagnosis present

## 2024-01-07 LAB — BASIC METABOLIC PANEL WITH GFR
BUN/Creatinine Ratio: 17 (ref 9–23)
BUN: 13 mg/dL (ref 6–24)
CO2: 24 mmol/L (ref 20–29)
Calcium: 9.3 mg/dL (ref 8.7–10.2)
Chloride: 98 mmol/L (ref 96–106)
Creatinine, Ser: 0.77 mg/dL (ref 0.57–1.00)
Glucose: 104 mg/dL — ABNORMAL HIGH (ref 70–99)
Potassium: 5.1 mmol/L (ref 3.5–5.2)
Sodium: 137 mmol/L (ref 134–144)
eGFR: 89 mL/min/1.73 (ref >59)

## 2024-01-08 ENCOUNTER — Ambulatory Visit: Payer: Self-pay | Admitting: Family Medicine

## 2024-01-12 ENCOUNTER — Other Ambulatory Visit: Payer: Self-pay

## 2024-01-12 DIAGNOSIS — F17210 Nicotine dependence, cigarettes, uncomplicated: Secondary | ICD-10-CM

## 2024-01-12 DIAGNOSIS — Z122 Encounter for screening for malignant neoplasm of respiratory organs: Secondary | ICD-10-CM

## 2024-01-12 DIAGNOSIS — Z87891 Personal history of nicotine dependence: Secondary | ICD-10-CM

## 2024-06-20 ENCOUNTER — Ambulatory Visit: Admitting: Family Medicine
